# Patient Record
Sex: Female | Born: 1990 | Race: Black or African American | Hispanic: No | Marital: Single | Smoking: Never smoker
Health system: Southern US, Community
[De-identification: ages and names within clinical notes are randomized; demographics above are authoritative.]

## PROBLEM LIST (undated history)

## (undated) DIAGNOSIS — E559 Vitamin D deficiency, unspecified: Secondary | ICD-10-CM

## (undated) DIAGNOSIS — I10 Essential (primary) hypertension: Secondary | ICD-10-CM

## (undated) DIAGNOSIS — I499 Cardiac arrhythmia, unspecified: Secondary | ICD-10-CM

## (undated) DIAGNOSIS — J45909 Unspecified asthma, uncomplicated: Secondary | ICD-10-CM

## (undated) DIAGNOSIS — K219 Gastro-esophageal reflux disease without esophagitis: Secondary | ICD-10-CM

## (undated) DIAGNOSIS — M1990 Unspecified osteoarthritis, unspecified site: Secondary | ICD-10-CM

## (undated) DIAGNOSIS — R519 Headache, unspecified: Secondary | ICD-10-CM

## (undated) DIAGNOSIS — D649 Anemia, unspecified: Secondary | ICD-10-CM

## (undated) DIAGNOSIS — F419 Anxiety disorder, unspecified: Secondary | ICD-10-CM

## (undated) DIAGNOSIS — E119 Type 2 diabetes mellitus without complications: Secondary | ICD-10-CM

## (undated) DIAGNOSIS — E785 Hyperlipidemia, unspecified: Secondary | ICD-10-CM

## (undated) HISTORY — DX: Essential (primary) hypertension: I10

## (undated) HISTORY — PX: WISDOM TOOTH EXTRACTION: SHX21

## (undated) HISTORY — DX: Unspecified osteoarthritis, unspecified site: M19.90

---
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
## 2011-01-06 ENCOUNTER — Emergency Department (HOSPITAL_COMMUNITY)
Admission: EM | Admit: 2011-01-06 | Discharge: 2011-01-06 | Disposition: A | Discharge status: Home / Self Care | Payer: 59 | Attending: Emergency Medicine | Admitting: Emergency Medicine

## 2011-01-06 ENCOUNTER — Emergency Department (HOSPITAL_COMMUNITY): Payer: 59

## 2011-01-06 DIAGNOSIS — R0989 Other specified symptoms and signs involving the circulatory and respiratory systems: Secondary | ICD-10-CM | POA: Insufficient documentation

## 2011-01-06 DIAGNOSIS — E1142 Type 2 diabetes mellitus with diabetic polyneuropathy: Secondary | ICD-10-CM | POA: Insufficient documentation

## 2011-01-06 DIAGNOSIS — M79609 Pain in unspecified limb: Secondary | ICD-10-CM | POA: Insufficient documentation

## 2011-01-06 DIAGNOSIS — Z794 Long term (current) use of insulin: Secondary | ICD-10-CM | POA: Insufficient documentation

## 2011-01-06 DIAGNOSIS — R0789 Other chest pain: Secondary | ICD-10-CM | POA: Insufficient documentation

## 2011-01-06 DIAGNOSIS — R0609 Other forms of dyspnea: Secondary | ICD-10-CM | POA: Insufficient documentation

## 2011-01-06 DIAGNOSIS — R05 Cough: Secondary | ICD-10-CM | POA: Insufficient documentation

## 2011-01-06 DIAGNOSIS — E1049 Type 1 diabetes mellitus with other diabetic neurological complication: Secondary | ICD-10-CM | POA: Insufficient documentation

## 2011-01-06 DIAGNOSIS — R059 Cough, unspecified: Secondary | ICD-10-CM | POA: Insufficient documentation

## 2011-01-06 LAB — CBC
HCT: 36.2 % (ref 36.0–46.0)
Hemoglobin: 11.7 g/dL — ABNORMAL LOW (ref 12.0–15.0)
MCH: 25.9 pg — ABNORMAL LOW (ref 26.0–34.0)
MCHC: 32.3 g/dL (ref 30.0–36.0)
MCV: 80.3 fL (ref 78.0–100.0)
Platelets: 256 10*3/uL (ref 150–400)
RBC: 4.51 MIL/uL (ref 3.87–5.11)
RDW: 12.5 % (ref 11.5–15.5)
WBC: 5.1 10*3/uL (ref 4.0–10.5)

## 2011-01-06 LAB — BASIC METABOLIC PANEL
BUN: 8 mg/dL (ref 6–23)
CO2: 28 mEq/L (ref 19–32)
Calcium: 9.4 mg/dL (ref 8.4–10.5)
Chloride: 101 mEq/L (ref 96–112)
Creatinine, Ser: 0.38 mg/dL — ABNORMAL LOW (ref 0.50–1.10)
GFR calc Af Amer: >90 mL/min (ref >90)
GFR calc non Af Amer: >90 mL/min (ref >90)
Glucose, Bld: 179 mg/dL — ABNORMAL HIGH (ref 70–99)
Potassium: 3.1 mEq/L — ABNORMAL LOW (ref 3.5–5.1)
Sodium: 138 mEq/L (ref 135–145)

## 2011-01-06 LAB — DIFFERENTIAL
Basophils Absolute: 0 10*3/uL (ref 0.0–0.1)
Basophils Relative: 1 % (ref 0–1)
Eosinophils Absolute: 0 10*3/uL (ref 0.0–0.7)
Eosinophils Relative: 1 % (ref 0–5)
Lymphocytes Relative: 56 % — ABNORMAL HIGH (ref 12–46)
Lymphs Abs: 2.8 10*3/uL (ref 0.7–4.0)
Monocytes Absolute: 0.5 10*3/uL (ref 0.1–1.0)
Monocytes Relative: 9 % (ref 3–12)
Neutro Abs: 1.7 10*3/uL (ref 1.7–7.7)
Neutrophils Relative %: 34 % — ABNORMAL LOW (ref 43–77)

## 2011-01-06 LAB — URINALYSIS, ROUTINE W REFLEX MICROSCOPIC
Bilirubin Urine: NEGATIVE
Glucose, UA: NEGATIVE mg/dL
Hgb urine dipstick: NEGATIVE
Ketones, ur: 15 mg/dL — AB
Leukocytes, UA: NEGATIVE
Nitrite: NEGATIVE
Protein, ur: NEGATIVE mg/dL
Specific Gravity, Urine: 1.019 (ref 1.005–1.030)
Urobilinogen, UA: 1 mg/dL (ref 0.0–1.0)
pH: 7 (ref 5.0–8.0)

## 2011-01-06 LAB — D-DIMER, QUANTITATIVE (NOT AT ARMC): D-Dimer, Quant: 0.38 ug/mL-FEU (ref 0.00–0.48)

## 2011-01-06 LAB — POCT PREGNANCY, URINE: Preg Test, Ur: NEGATIVE

## 2011-01-06 LAB — GLUCOSE, CAPILLARY: Glucose-Capillary: 160 mg/dL — ABNORMAL HIGH (ref 70–99)

## 2012-03-03 ENCOUNTER — Encounter (HOSPITAL_COMMUNITY): Payer: Self-pay | Admitting: Emergency Medicine

## 2012-03-03 ENCOUNTER — Emergency Department (INDEPENDENT_AMBULATORY_CARE_PROVIDER_SITE_OTHER)
Admission: EM | Admit: 2012-03-03 | Discharge: 2012-03-03 | Disposition: A | Discharge status: Home / Self Care | Payer: 59 | Source: Home / Self Care | Attending: Emergency Medicine | Admitting: Emergency Medicine

## 2012-03-03 DIAGNOSIS — N390 Urinary tract infection, site not specified: Secondary | ICD-10-CM

## 2012-03-03 HISTORY — DX: Type 2 diabetes mellitus without complications: E11.9

## 2012-03-03 HISTORY — DX: Unspecified asthma, uncomplicated: J45.909

## 2012-03-03 LAB — POCT PREGNANCY, URINE: Preg Test, Ur: NEGATIVE

## 2012-03-03 LAB — POCT URINALYSIS DIP (DEVICE)
Glucose, UA: >=1000 mg/dL — AB
Ketones, ur: 80 mg/dL — AB
Leukocytes, UA: NEGATIVE
Nitrite: POSITIVE — AB
Protein, ur: 100 mg/dL — AB
Specific Gravity, Urine: <=1.005 (ref 1.005–1.030)
Urobilinogen, UA: 2 mg/dL — ABNORMAL HIGH (ref 0.0–1.0)
pH: 5 (ref 5.0–8.0)

## 2012-03-03 LAB — GLUCOSE, CAPILLARY: Glucose-Capillary: 349 mg/dL — ABNORMAL HIGH (ref 70–99)

## 2012-03-03 MED ORDER — INSULIN ASPART 100 UNIT/ML ~~LOC~~ SOLN: 8.0000 [IU] | Freq: Once | SUBCUTANEOUS | Status: DC

## 2012-03-03 MED ORDER — CEPHALEXIN 500 MG PO CAPS: 500.0000 mg | ORAL_CAPSULE | Freq: Four times a day (QID) | ORAL | Status: AC

## 2012-03-03 NOTE — ED Provider Notes (Signed)
History     CSN: [id number]  Arrival date & time 03/03/12  1416   First MD Initiated Contact with Patient 03/03/12 1445      Chief Complaint  Patient presents with  . Dysuria    pain with urninating since saturday. frequency. odor pelvic pain    (Consider location/radiation/quality/duration/timing/severity/associated sxs/prior treatment) HPI Comments: Patient presents urgent care describing that her urine has been cloudy and with a bad odor since Tuesday. Been having some pressure with urination burning and discomfort been taking over-the-counter AZO, which has helped some. She denies any fevers, nausea vomiting or flank pain. Describes lower valve pressure with urination no pain at rest.  Patient is a 21 y.o. female presenting with dysuria. The history is provided by the patient.  Dysuria  This is a new problem. The problem occurs every urination. The problem has been gradually worsening. The quality of the pain is described as burning. The pain is at a severity of 5/10. The pain is moderate. There has been no fever. Associated symptoms include frequency, hesitancy and urgency. Pertinent negatives include no chills, no vomiting, no possible pregnancy and no flank pain. Her past medical history does not include kidney stones.    Past Medical History  Diagnosis Date  . Asthma   . Diabetes mellitus without complication     History reviewed. No pertinent past surgical history.  History reviewed. No pertinent family history.  History  Substance Use Topics  . Smoking status: Never Smoker   . Smokeless tobacco: Not on file  . Alcohol Use: No    OB History    Grav Para Term Preterm Abortions TAB SAB Ect Mult Living                  Review of Systems  Constitutional: Negative for fever, chills and appetite change.  Gastrointestinal: Negative for vomiting.  Genitourinary: Positive for dysuria, hesitancy, urgency and frequency. Negative for flank pain.    Allergies  Review  of patient's allergies indicates no known allergies.  Home Medications   Current Outpatient Rx  Name  Route  Sig  Dispense  Refill  . CEPHALEXIN 500 MG PO CAPS   Oral   Take 1 capsule (500 mg total) by mouth 4 (four) times daily.   28 capsule   0   . DULOXETINE HCL 60 MG PO CPEP   Oral   Take 60 mg by mouth daily.         Marland Kitchen HUMALOG KWIKPEN 100 UNIT/ML [place] SOLN               . HYDROCODONE-ACETAMINOPHEN PO   Oral   Take by mouth.         Marland Kitchen PREGABALIN 150 MG PO CAPS   Oral   Take 150 mg by mouth 2 (two) times daily.           BP 145/92  Pulse 114  Temp 98.9 F (37.2 C) (Oral)  Resp 18  SpO2 96%  LMP 02/14/2012  Physical Exam  Vitals reviewed. Constitutional: Vital signs are normal. She appears well-developed and well-nourished.  Non-toxic appearance. She does not have a sickly appearance. She does not appear ill. No distress.  HENT:  Head: Normocephalic.  Eyes: Conjunctivae normal are normal.  Neck: Neck supple.  Pulmonary/Chest: Effort normal and breath sounds normal. No respiratory distress.  Abdominal: Soft. Normal appearance. She exhibits no mass. There is no hepatosplenomegaly or hepatomegaly. There is tenderness. There is no rigidity, no rebound, no guarding,  no CVA tenderness and negative Murphy's sign.  Skin: No rash noted. No erythema.    ED Course  Procedures (including critical care time)  Labs Reviewed  POCT URINALYSIS DIP (DEVICE) - Abnormal; Notable for the following:    Glucose, UA >=1000 (*)     Bilirubin Urine SMALL (*)     Ketones, ur 80 (*)     Hgb urine dipstick SMALL (*)     Protein, ur 100 (*)     Urobilinogen, UA 2.0 (*)     Nitrite POSITIVE (*)     All other components within normal limits  POCT PREGNANCY, URINE  URINE CULTURE   No results found.   1. Urinary tract infection       MDM  Urinary tract infection. Will start patient on Keflex. Encourage her to return if no improvement is noted in 48 hours. Any fevers  or vomiting. Patient agrees with treatment plan and followup care as necessary.        Jimmie Molly, MD 03/03/12 [phone]

## 2012-03-03 NOTE — ED Notes (Signed)
Pt declined insulin injection. Pt states that she will use her own supply. Mw,cma

## 2012-03-03 NOTE — ED Notes (Signed)
Pt c/o dysuria. Pt states that urine is cloudy and has a bad odor./frequency and pelvic pain. Pt states that she did use azo with only mild relief of symptoms. Symptoms are gradually getting worse.

## 2012-03-03 NOTE — ED Notes (Signed)
Waiting discharge papers 

## 2012-03-05 LAB — URINE CULTURE: Colony Count: 50000

## 2012-03-06 NOTE — ED Notes (Signed)
Urine culture: 50,000 colonies E. Coli.  Pt. adequately treated with Keflex. Vassie Moselle 03/06/2012

## 2012-12-21 DIAGNOSIS — Z9119 Patient's noncompliance with other medical treatment and regimen: Secondary | ICD-10-CM | POA: Insufficient documentation

## 2013-03-21 DIAGNOSIS — G709 Myoneural disorder, unspecified: Secondary | ICD-10-CM

## 2013-03-21 HISTORY — DX: Myoneural disorder, unspecified: G70.9

## 2013-08-06 DIAGNOSIS — E1140 Type 2 diabetes mellitus with diabetic neuropathy, unspecified: Secondary | ICD-10-CM | POA: Insufficient documentation

## 2013-09-26 ENCOUNTER — Encounter: Payer: Self-pay | Admitting: Physical Medicine & Rehabilitation

## 2013-10-07 DIAGNOSIS — E668 Other obesity: Secondary | ICD-10-CM | POA: Insufficient documentation

## 2013-10-07 DIAGNOSIS — F32A Depression, unspecified: Secondary | ICD-10-CM | POA: Insufficient documentation

## 2013-10-07 DIAGNOSIS — F329 Major depressive disorder, single episode, unspecified: Secondary | ICD-10-CM | POA: Insufficient documentation

## 2013-11-22 ENCOUNTER — Telehealth: Payer: Self-pay | Admitting: Physical Medicine & Rehabilitation

## 2013-11-22 ENCOUNTER — Encounter: Payer: Self-pay | Admitting: Physical Medicine & Rehabilitation

## 2013-11-22 ENCOUNTER — Encounter: Payer: 59 | Attending: Physical Medicine & Rehabilitation

## 2013-11-22 ENCOUNTER — Ambulatory Visit (HOSPITAL_BASED_OUTPATIENT_CLINIC_OR_DEPARTMENT_OTHER): Payer: 59 | Admitting: Physical Medicine & Rehabilitation

## 2013-11-22 VITALS — BP 148/80 | HR 102 | Resp 16 | Ht 64.0 in | Wt 261.0 lb

## 2013-11-22 DIAGNOSIS — E1142 Type 2 diabetes mellitus with diabetic polyneuropathy: Secondary | ICD-10-CM | POA: Diagnosis not present

## 2013-11-22 DIAGNOSIS — Z79899 Other long term (current) drug therapy: Secondary | ICD-10-CM | POA: Insufficient documentation

## 2013-11-22 DIAGNOSIS — G8929 Other chronic pain: Secondary | ICD-10-CM | POA: Diagnosis not present

## 2013-11-22 DIAGNOSIS — Z5181 Encounter for therapeutic drug level monitoring: Secondary | ICD-10-CM | POA: Diagnosis not present

## 2013-11-22 DIAGNOSIS — M545 Low back pain, unspecified: Secondary | ICD-10-CM | POA: Insufficient documentation

## 2013-11-22 DIAGNOSIS — E1049 Type 1 diabetes mellitus with other diabetic neurological complication: Secondary | ICD-10-CM

## 2013-11-22 DIAGNOSIS — E1042 Type 1 diabetes mellitus with diabetic polyneuropathy: Secondary | ICD-10-CM

## 2013-11-22 NOTE — Telephone Encounter (Signed)
Pt called to say she was very upset that Dr. Wynn Banker showed no compassion or empathy to her in her opinion. She felt that he made a judgement call "by looking at her exterior". She says she has never been treated this way and is going to call the Department of Insurance to see who she can file a complaint with. She states she will be telling her PCP that she does not wish to come here and would not recommend anyone come here. She spent quite a bit of time complaining about Dr. Wynn Banker telling her to lose weight. She stated more than once, very loudly, "How dare he tell me I'm not in pain from neuropathy". She ended the call stating that I could call her back if I really wanted hear this all over again but I was not the only person she was going to complain to, I was just her first stop.

## 2013-11-22 NOTE — Patient Instructions (Signed)
Will need records from [facility] pain management in regards to what type of injection they did and at what level.  Will also need MRI report from [facility].  agree with physical therapy  Recommend weight loss of at least 10% which would equal about 25 pounds

## 2013-11-22 NOTE — Telephone Encounter (Signed)
I explained that her back pain was not from her neuropathy however it is related to her weight. I never told her she had no pain from the neuropathy. 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

## 2013-11-22 NOTE — Progress Notes (Signed)
Subjective:    Patient ID: [name], female    DOB: 1991-02-29, 23 y.o.   MRN: [id number]  HPI CC:  Low back pain  23 year old female with history of diabetes onset age 75. She has a history of diabetic neuropathy however symptoms are well controlled on a combination of Lyrica and Cymbalta.  In terms of her low back, onset 2012. No history of accidents or injury. Has tried back bracing which was not helpful. Underwent physical therapy a couple years ago. It was helpful while she is going through it.  A couple months after she quit her pain came back again. Patient tried aquatic therapy at the [facility] one-on-one visit. This was not helpful Reason referral for another round of physical therapy.  Has been seen in the past at [facility] [address] [place] [address] [phone]  Patient no longer going there secondary to financial reasons.  Patient had some type of injections which she's felt were helpful. The injections were bilateral. No records of this at the current time.  Patient has also had MRI in the past at [facility], results are not available to me.  HgbA1C is 10 Pain Inventory Average Pain 7 Pain Right Now 7 My pain is constant, sharp, burning, stabbing, tingling and aching  In the last 24 hours, has pain interfered with the following? General activity 5 Relation with others 5 Enjoyment of life 5 What TIME of day is your pain at its worst? daytime Sleep (in general) Fair  Pain is worse with: bending, standing and some activites Pain improves with: heat/ice, therapy/exercise, medication and injections Relief from Meds: 5  Mobility walk without assistance ability to climb steps?  yes do you drive?  no transfers alone Do you have any goals in this area?  yes  Function disabled: date disabled 01/2013  Neuro/Psych bowel control problems weakness numbness tremor tingling spasms depression anxiety  Prior Studies Any  changes since last visit?  yes x-rays CT/MRI nerve study  Physicians involved in your care Any changes since last visit?  no   Family History  Problem Relation Age of Onset  . Diabetes Maternal Grandmother    History   Social History  . Marital Status: Single    Spouse Name: N/A    Number of Children: N/A  . Years of Education: N/A   Social History Main Topics  . Smoking status: Never Smoker   . Smokeless tobacco: None  . Alcohol Use: No  . Drug Use: None  . Sexual Activity: Yes    Birth Control/ Protection: None   Other Topics Concern  . None   Social History Narrative  . None   History reviewed. No pertinent past surgical history. Past Medical History  Diagnosis Date  . Asthma   . Diabetes mellitus without complication   . Arthritis   . Hypertension    BP 148/80  Pulse 102  Resp 16  Ht 5\' 4"  (1.626 m)  Wt 261 lb (118.389 kg)  BMI 44.78 kg/m2  SpO2 100%  Opioid Risk Score: 2 Fall Risk Score: Moderate Fall Risk (6-13 points) (pt educated, handout given to pt)    Review of Systems  Constitutional: Positive for unexpected weight change.  Respiratory: Positive for shortness of breath.   Gastrointestinal: Positive for abdominal pain and constipation.  Genitourinary:       Bowel control problems  Skin: Positive for rash.  Neurological: Positive for tremors, weakness and numbness.  Tingling, spasms  Psychiatric/Behavioral: The patient is nervous/anxious.        Depression  All other systems reviewed and are negative.      Objective:   Physical Exam  Nursing note and vitals reviewed. Constitutional: She is oriented to person, place, and time. She appears well-developed and well-nourished.  HENT:  Head: Normocephalic and atraumatic.  Eyes: Conjunctivae and EOM are normal. Pupils are equal, round, and reactive to light.  Neck: Normal range of motion.  Musculoskeletal:       Cervical back: Normal.       Thoracic back: She exhibits  tenderness.       Lumbar back: She exhibits tenderness. She exhibits no deformity.  Negative straight leg raising test    Neurological: She is alert and oriented to person, place, and time. She has normal strength. She displays no atrophy and no tremor. She exhibits normal muscle tone. Gait normal.  Reflex Scores:      Tricep reflexes are 2+ on the right side and 2+ on the left side.      Bicep reflexes are 2+ on the right side and 2+ on the left side.      Brachioradialis reflexes are 2+ on the right side and 2+ on the left side.      Patellar reflexes are 2+ on the right side and 2+ on the left side.      Achilles reflexes are 0 on the right side and 0 on the left side. Mildly reduced pinprick sensation in the toes  No evidence of foot intrinsic muscle atrophy  Psychiatric: She has a normal mood and affect.   Patient has pain with lumbar extension greater than lumbar flexion. FABERs negative        Assessment & Plan:   1.  Chronic low back pain- no signs of radiculopathy. Suspect lumbar facet mediated pain given greater pain with extension. Also had an injection [facility] at [facility] on both sides of her lumbar area with temporary relief. She states that physician discussed another longer-lasting procedure which sounds like radio frequency neurotomy. Requesting records from [facility]   Patient reports having had MRI will require the report to be sent to her office to further review prior to performing any type of intervention pain procedure.  We discussed given her body habitus that facet mediated pain is more common and that weight loss of as little is 10% would be beneficial not only for back pain but diabetic control.    We'll need to review your drugs 3 as well as lumbar MRI results prior to prescribing any narcotic analgesic medications.  2. Diabetic neuropathy with mildly decreased sensation in toes, no signs of skin breakdown, per patient  these symptoms are well managed with combination of Cymbalta 60 mg a day and Lyrica 150 mg twice a day. I discussed with patient that her low back pain is not related to her diabetic neuropathy.

## 2013-12-31 ENCOUNTER — Other Ambulatory Visit: Payer: Self-pay | Admitting: Gastroenterology

## 2013-12-31 DIAGNOSIS — R1111 Vomiting without nausea: Secondary | ICD-10-CM

## 2013-12-31 DIAGNOSIS — R1011 Right upper quadrant pain: Secondary | ICD-10-CM

## 2014-01-10 ENCOUNTER — Emergency Department (HOSPITAL_COMMUNITY)
Admission: EM | Admit: 2014-01-10 | Discharge: 2014-01-10 | Disposition: A | Discharge status: Home / Self Care | Payer: 59 | Attending: Emergency Medicine | Admitting: Emergency Medicine

## 2014-01-10 ENCOUNTER — Encounter (HOSPITAL_COMMUNITY): Payer: Self-pay | Admitting: Emergency Medicine

## 2014-01-10 DIAGNOSIS — Z79899 Other long term (current) drug therapy: Secondary | ICD-10-CM | POA: Diagnosis not present

## 2014-01-10 DIAGNOSIS — E119 Type 2 diabetes mellitus without complications: Secondary | ICD-10-CM | POA: Insufficient documentation

## 2014-01-10 DIAGNOSIS — L235 Allergic contact dermatitis due to other chemical products: Secondary | ICD-10-CM | POA: Insufficient documentation

## 2014-01-10 DIAGNOSIS — Z8739 Personal history of other diseases of the musculoskeletal system and connective tissue: Secondary | ICD-10-CM | POA: Insufficient documentation

## 2014-01-10 DIAGNOSIS — J45909 Unspecified asthma, uncomplicated: Secondary | ICD-10-CM | POA: Insufficient documentation

## 2014-01-10 DIAGNOSIS — I10 Essential (primary) hypertension: Secondary | ICD-10-CM | POA: Insufficient documentation

## 2014-01-10 DIAGNOSIS — L259 Unspecified contact dermatitis, unspecified cause: Secondary | ICD-10-CM

## 2014-01-10 DIAGNOSIS — L299 Pruritus, unspecified: Secondary | ICD-10-CM | POA: Diagnosis present

## 2014-01-10 DIAGNOSIS — Z794 Long term (current) use of insulin: Secondary | ICD-10-CM | POA: Insufficient documentation

## 2014-01-10 MED ORDER — FAMOTIDINE 20 MG PO TABS: 20.0000 mg | ORAL_TABLET | Freq: Two times a day (BID) | ORAL | Status: DC

## 2014-01-10 MED ORDER — FAMOTIDINE 20 MG PO TABS
20.0000 mg | ORAL_TABLET | Freq: Once | ORAL | Status: AC
  Administered 2014-01-10: 20 mg via ORAL
  Filled 2014-01-10: qty 1

## 2014-01-10 MED ORDER — DIPHENHYDRAMINE HCL 50 MG/ML IJ SOLN
50.0000 mg | Freq: Once | INTRAMUSCULAR | Status: AC
  Administered 2014-01-10: 50 mg via INTRAMUSCULAR
  Filled 2014-01-10: qty 1

## 2014-01-10 MED ORDER — CEPHALEXIN 500 MG PO CAPS: 500.0000 mg | ORAL_CAPSULE | Freq: Three times a day (TID) | ORAL | Status: DC

## 2014-01-10 MED ORDER — CEPHALEXIN 500 MG PO CAPS
500.0000 mg | ORAL_CAPSULE | Freq: Once | ORAL | Status: AC
  Administered 2014-01-10: 500 mg via ORAL
  Filled 2014-01-10: qty 1

## 2014-01-10 MED ORDER — PREDNISONE 20 MG PO TABS: ORAL_TABLET | ORAL | Status: DC

## 2014-01-10 MED ORDER — CETIRIZINE HCL 10 MG PO TABS: 10.0000 mg | ORAL_TABLET | Freq: Every day | ORAL | Status: DC

## 2014-01-10 MED ORDER — METHYLPREDNISOLONE SODIUM SUCC 125 MG IJ SOLR
125.0000 mg | Freq: Once | INTRAMUSCULAR | Status: AC
  Administered 2014-01-10: 125 mg via INTRAMUSCULAR
  Filled 2014-01-10: qty 2

## 2014-01-10 NOTE — Discharge Instructions (Signed)
DO NOT USE THAT HAIR PRODUCT AGAIN AND BE CAREFUL WITH OTHER PRODUCTS LIKE IT!!!  Take the medications as prescribed. The zyrtec will be less sedating than the benadryl which you can take 25-50 mg every 6 hrs for itching and swelling.  Recheck if you get a fever, your scalp starts getting more painful, your scalp starts draining pus or you have trouble breathing or swallowing. Monitor your blood glucose closely while on the prednisone and watch what you eat until the prednisone is gone.

## 2014-01-10 NOTE — ED Notes (Signed)
Pt presents with c/o of gradual shortness of breath, mild chest pain and facial rush. Pt states she used "protein pack" for her hair after which she had onset of the symptoms. Pt mildly anxious but in NAD.

## 2014-01-10 NOTE — ED Provider Notes (Signed)
CSN: [id number]     Arrival date & time 01/10/14  2026 History   First MD Initiated Contact with Patient 01/10/14 2106     Chief Complaint  Patient presents with  . Allergic Reaction     (Consider location/radiation/quality/duration/timing/severity/associated sxs/prior Treatment) HPI Patient states yesterday she used a protein pack which is a oil in her hair. She states she left it on for 10 minutes then rinsed it off. She states she had almost immediate itching of her scalp afterwards. She states this morning when she woke up her ears were swollen and they felt hot. She also states she was having continued itching of her scalp however now her scalp is sore and painful. She had some crusting in her ears today. She states her face is burning and her friend states her face looks swollen. She has not had any draining from her scalp. She states she's never used this treatment before. She's never had a reaction like this before. She is not having difficulty breathing or swallowing.   PCP Dr Leavy Cella  Past Medical History  Diagnosis Date  . Asthma   . Diabetes mellitus without complication   . Arthritis   . Hypertension    History reviewed. No pertinent past surgical history. Family History  Problem Relation Age of Onset  . Diabetes Maternal Grandmother    History  Substance Use Topics  . Smoking status: Never Smoker   . Smokeless tobacco: Not on file  . 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     Allergies  Review of patient's allergies indicates no known allergies.  Home Medications   Prior to Admission medications   Medication Sig Start Date End Date Taking? Authorizing Provider  dexlansoprazole (DEXILANT) 60 MG capsule Take 60 mg by mouth daily.   Yes Historical Provider, MD  HUMALOG KWIKPEN 100 UNIT/ML  injection Inject 22 Units into the skin 3 (three) times daily. Sliding Scale 08/27/10  Yes Historical Provider, MD  insulin glargine (LANTUS) 100 UNIT/ML injection Inject 60 Units into the skin at bedtime.   Yes Historical Provider, MD  pregabalin (LYRICA) 150 MG capsule Take 150 mg by mouth 2 (two) times daily.   Yes Historical Provider, MD   BP 150/101  Pulse 110  Temp(Src) 99.1 F (37.3 C) (Oral)  Resp 20  SpO2 100%  LMP 12/30/2013  Vital signs normal except hypertension and tachycardia   Physical Exam  Nursing note and vitals reviewed. Constitutional: She is oriented to person, place, and time. She appears well-developed and well-nourished.  Non-toxic appearance. She does not appear ill. No distress.  HENT:  Head: Normocephalic and atraumatic.  Right Ear: External ear normal.  Left Ear: External ear normal.  Nose: Nose normal. No mucosal edema or rhinorrhea.  Mouth/Throat: Oropharynx is clear and moist and mucous membranes are normal. No dental abscesses or uvula swelling.  Eyes: Conjunctivae and EOM are normal. Pupils are equal, round, and reactive to light.  Neck: Normal range of motion and full passive range of motion without pain. Neck supple.  Cardiovascular: Normal rate, regular rhythm and normal heart sounds.  Exam reveals no gallop and no friction rub.   No murmur heard. Pulmonary/Chest: Effort normal and breath sounds normal. No respiratory distress.  She has no wheezes. She has no rhonchi. She has no rales. She exhibits no crepitus.  Abdominal: Normal appearance.  Musculoskeletal: Normal range of motion. She exhibits no edema and no tenderness.  Moves all extremities well.   Neurological: She is alert and oriented to person, place, and time. She has normal strength. No cranial nerve deficit.  Skin: Skin is warm, dry and intact. No rash noted. No erythema. No pallor.  Patient's noted to have some diffuse redness of her scalp without open lesions. She has some mild swelling  seen at the hairline. She has diffuse redness and swelling of her ear lobes. She does not have lesions in her mouth. She does not have any rash on her arms or legs or trunk.  Psychiatric: She has a normal mood and affect. Her speech is normal and behavior is normal. Her mood appears not anxious.    ED Course  Procedures (including critical care time)  Medications  diphenhydrAMINE (BENADRYL) injection 50 mg (50 mg Intramuscular Given 01/10/14 2146)  famotidine (PEPCID) tablet 20 mg (20 mg Oral Given 01/10/14 2146)  methylPREDNISolone sodium succinate (SOLU-MEDROL) 125 mg/2 mL injection 125 mg (125 mg Intramuscular Given 01/10/14 2146)  cephALEXin (KEFLEX) capsule 500 mg (500 mg Oral Given 01/10/14 2243)    Labs Review CBG 120  Imaging Review No results found.   EKG Interpretation None      MDM   Final diagnoses:  Contact dermatitis    New Prescriptions   CEPHALEXIN (KEFLEX) 500 MG CAPSULE    Take 1 capsule (500 mg total) by mouth 3 (three) times daily.   CETIRIZINE (ZYRTEC) 10 MG TABLET    Take 1 tablet (10 mg total) by mouth daily.   FAMOTIDINE (PEPCID) 20 MG TABLET    Take 1 tablet (20 mg total) by mouth 2 (two) times daily.   PREDNISONE (DELTASONE) 20 MG TABLET    Take 3 po QD x 2d starting October 24, then 2 po QD x 3d then 1 po QD x 3d    Plan discharge  Devoria Albe, MD, Franz Dell, MD 01/10/14 [phone]

## 2014-01-10 NOTE — ED Notes (Signed)
CBG 120 

## 2014-01-13 ENCOUNTER — Ambulatory Visit (HOSPITAL_COMMUNITY)
Admission: RE | Admit: 2014-01-13 | Discharge: 2014-01-13 | Disposition: A | Discharge status: Home / Self Care | Payer: 59 | Source: Ambulatory Visit | Attending: Gastroenterology | Admitting: Gastroenterology

## 2014-01-13 ENCOUNTER — Encounter (HOSPITAL_COMMUNITY)
Admission: RE | Admit: 2014-01-13 | Discharge: 2014-01-13 | Disposition: A | Discharge status: Home / Self Care | Payer: 59 | Source: Ambulatory Visit | Attending: Diagnostic Radiology | Admitting: Diagnostic Radiology

## 2014-01-13 DIAGNOSIS — R1011 Right upper quadrant pain: Secondary | ICD-10-CM | POA: Insufficient documentation

## 2014-01-13 DIAGNOSIS — R1111 Vomiting without nausea: Secondary | ICD-10-CM | POA: Diagnosis not present

## 2014-01-13 LAB — CBG MONITORING, ED: Glucose-Capillary: 120 mg/dL — ABNORMAL HIGH (ref 70–99)

## 2014-01-13 MED ORDER — TECHNETIUM TC 99M MEBROFENIN IV KIT
4.9000 | PACK | Freq: Once | INTRAVENOUS | Status: AC | PRN
  Administered 2014-01-13: 4.9 via INTRAVENOUS

## 2014-01-13 MED ORDER — SINCALIDE 5 MCG IJ SOLR
0.0200 ug/kg | Freq: Once | INTRAMUSCULAR | Status: AC
  Administered 2014-01-13: 2 ug via INTRAVENOUS

## 2014-01-20 ENCOUNTER — Other Ambulatory Visit: Payer: Self-pay | Admitting: Gastroenterology

## 2014-01-20 DIAGNOSIS — R1013 Epigastric pain: Secondary | ICD-10-CM

## 2014-01-20 DIAGNOSIS — R110 Nausea: Secondary | ICD-10-CM

## 2014-01-29 ENCOUNTER — Encounter (HOSPITAL_COMMUNITY)
Admission: RE | Admit: 2014-01-29 | Discharge: 2014-01-29 | Disposition: A | Discharge status: Home / Self Care | Payer: 59 | Source: Ambulatory Visit | Attending: Gastroenterology | Admitting: Gastroenterology

## 2014-01-29 DIAGNOSIS — R110 Nausea: Secondary | ICD-10-CM | POA: Insufficient documentation

## 2014-01-29 DIAGNOSIS — R1013 Epigastric pain: Secondary | ICD-10-CM

## 2014-01-29 MED ORDER — TECHNETIUM TC 99M SULFUR COLLOID
2.0000 | Freq: Once | INTRAVENOUS | Status: AC | PRN
  Administered 2014-01-29: 2 via ORAL

## 2014-07-22 ENCOUNTER — Encounter: Payer: 59 | Attending: Internal Medicine | Admitting: *Deleted

## 2014-07-22 DIAGNOSIS — Z794 Long term (current) use of insulin: Secondary | ICD-10-CM | POA: Insufficient documentation

## 2014-07-22 DIAGNOSIS — E108 Type 1 diabetes mellitus with unspecified complications: Secondary | ICD-10-CM | POA: Insufficient documentation

## 2014-07-22 DIAGNOSIS — IMO0002 Reserved for concepts with insufficient information to code with codable children: Secondary | ICD-10-CM

## 2014-07-22 DIAGNOSIS — Z713 Dietary counseling and surveillance: Secondary | ICD-10-CM | POA: Insufficient documentation

## 2014-07-22 DIAGNOSIS — E1065 Type 1 diabetes mellitus with hyperglycemia: Secondary | ICD-10-CM

## 2014-07-25 ENCOUNTER — Encounter: Payer: Self-pay | Admitting: *Deleted

## 2014-07-25 NOTE — Progress Notes (Signed)
Introduction to Insulin Pump Therapy:  Date: 07/22/14  Appt start time: 1530 end time:  1700.  Assessment:  This patient has DM 1 and their primary concerns today: intro to pump therapy.  This patient is interested in learning more about insulin pump therapy because she wants to not take shots all day and to improve her control  MEDICATIONS: Basal Insulin: 50 units of Lantus at AM via pen  Bolus Insulin: 75 units of Humalog at Sliding Scale based on BG, typically 23 units after each meal  via pen  Total of insulin doses per day 125 Other diabetes medications: none  Patient states they are currently testing less than 2 times per day Patient does not currently have Ketone Strips  Current meal time insulin:  16 units per meal  If BG OK or Sliding Scale if BG is elevated (see below) Current Correction insulin:     Sliding Scale: 150-200 = 16 units    201-250 = 23 units    251-300 = 26 units Patient states knowledge of Carb Counting is poor  Usual physical activity: none due to neuropathy pain  Last A1c was 11.3  Patient states complications from diabetes include neuropathy Patient states they forget to take their Lantus insulin injection on average 3 times per week Patient states they have had hypoglycemia 0 times in the past month Patient states their biggest barrier with diabetes is remembering to take Lantus and frustration of not really being in control of her life  Patient currently is not working but she has enrolled in college and it taking online classes towards a psychology degree  Progress Towards Obtaining an Insulin PumpGoal(s):  In progress.  Patient states their expectations of pump therapy include: no more shots, better BG control Patient expresses understanding that for improved outcomes for their diabetes on an insulin pump they will:  Check BG at least 4 times per day  Change out pump infusion set at least every 3 days  Upload pump information to their pump's Web  Based Program on a regular basis so provider can assess patterns and make setting adjustments.     Intervention:    Taught difference between delivery of insulin via syringe/pen compared to insulin pump.  Demonstrated improved insulin delivery via pump due to improved accuracy of dose and flexibility of adjusting bolus insulin based on carb intake and BG correction.  Showed patient the following pumps: Animas, Medtronic, OmniPod, Tandem. She is interested in pursuing the Tandem  Demonstrated pump, insulin reservoir and infusion set options, and button pushing for bolus delivery of insulin through the pump  Explained importance of testing BG at least 4 times per day for appropriate correction of high BG and prevention of DKA as applicable.  Emphasized importance of follow up after Pump Start for appropriate pump setting adjustments and on-going training on more advanced features.  Discussed current Continuous Glucose Monitoring options.  Handouts given during visit include:  Introduction to Pump Therapy Handout  Insulin Pump Packet (she already has all pump packets)  Monitoring/Evaluation:    Patient does want to continue with pursuit of Tandem insulin pump.  Patient requested that I contact that Pump Company Rep so they can be contacted to start the process of obtaining the pump.  Patient instructed to go to that pump web-site to complete any learning module on insulin pump prior to next visit  Once pump is shipped, patient to call this office to set up training.

## 2015-01-27 ENCOUNTER — Encounter: Payer: 59 | Attending: Internal Medicine | Admitting: *Deleted

## 2015-01-27 DIAGNOSIS — E108 Type 1 diabetes mellitus with unspecified complications: Principal | ICD-10-CM

## 2015-01-27 DIAGNOSIS — E1065 Type 1 diabetes mellitus with hyperglycemia: Secondary | ICD-10-CM

## 2015-01-27 DIAGNOSIS — IMO0002 Reserved for concepts with insufficient information to code with codable children: Secondary | ICD-10-CM

## 2015-01-29 NOTE — Progress Notes (Signed)
Insulin Pump Start Progress Note on 01/27/15  Patient appointment start time: 1400  End time 1600  Patient here for insulin pump start on Medtronic 630G pump and Quickset infusion set Orders with pump settings received from MD Patient completed Pre- training by return demonstration  Reviewed Pump Set Up including  Menu Settings  Bolus Settings:    Insulin Carb Ratio of 1 unit / 5 grams     Insulin Sensitivity Factor of 1 unit / 15 mg/dl              Target: 334 mg/dl  Suspend  Basal with initial Basal Rate of 2.00 units/hour  Reservoir Set Up  Utilities  Pump Training Checklist completed  Did not use Temp Basal  due to patient taking their long acting insulin yesterday morning  Patient is aware to sign up for Web Based Software and agrees to upload within 3 days for review of progress and allow for pump setting adjustments.  Patient successfully completed pump start and instructed to call me if BG drops below 70 mg/dl or goes above 356 mg/dl or as directed by MD  Follow up plan: Patient offered follow up in 3 days for her 1st change out but she declined. Recommend follow up within 2 weeks to review [facility] reports as needed

## 2015-06-11 ENCOUNTER — Ambulatory Visit
Admission: RE | Admit: 2015-06-11 | Discharge: 2015-06-11 | Disposition: A | Discharge status: Home / Self Care | Payer: 59 | Source: Ambulatory Visit | Attending: Pain Medicine | Admitting: Pain Medicine

## 2015-06-11 ENCOUNTER — Other Ambulatory Visit: Payer: Self-pay | Admitting: Pain Medicine

## 2015-06-11 DIAGNOSIS — M545 Low back pain, unspecified: Secondary | ICD-10-CM

## 2015-06-11 DIAGNOSIS — M4307 Spondylolysis, lumbosacral region: Secondary | ICD-10-CM

## 2015-06-11 DIAGNOSIS — G8929 Other chronic pain: Secondary | ICD-10-CM

## 2015-08-21 ENCOUNTER — Other Ambulatory Visit: Payer: Self-pay | Admitting: Pain Medicine

## 2015-08-21 DIAGNOSIS — M25562 Pain in left knee: Principal | ICD-10-CM

## 2015-08-21 DIAGNOSIS — M25561 Pain in right knee: Secondary | ICD-10-CM

## 2015-08-30 ENCOUNTER — Ambulatory Visit
Admission: RE | Admit: 2015-08-30 | Discharge: 2015-08-30 | Disposition: A | Discharge status: Home / Self Care | Payer: 59 | Source: Ambulatory Visit | Attending: Pain Medicine | Admitting: Pain Medicine

## 2015-08-30 DIAGNOSIS — M25561 Pain in right knee: Secondary | ICD-10-CM

## 2015-08-30 DIAGNOSIS — M25562 Pain in left knee: Principal | ICD-10-CM

## 2016-08-01 ENCOUNTER — Other Ambulatory Visit: Payer: Self-pay | Admitting: Surgical Oncology

## 2016-08-01 DIAGNOSIS — E1065 Type 1 diabetes mellitus with hyperglycemia: Secondary | ICD-10-CM

## 2016-08-01 DIAGNOSIS — I10 Essential (primary) hypertension: Secondary | ICD-10-CM

## 2016-08-01 DIAGNOSIS — E1159 Type 2 diabetes mellitus with other circulatory complications: Secondary | ICD-10-CM

## 2016-08-01 DIAGNOSIS — IMO0002 Reserved for concepts with insufficient information to code with codable children: Secondary | ICD-10-CM

## 2016-08-01 DIAGNOSIS — I152 Hypertension secondary to endocrine disorders: Secondary | ICD-10-CM

## 2016-08-01 DIAGNOSIS — E1042 Type 1 diabetes mellitus with diabetic polyneuropathy: Secondary | ICD-10-CM

## 2016-08-05 ENCOUNTER — Ambulatory Visit
Admission: RE | Admit: 2016-08-05 | Discharge: 2016-08-05 | Disposition: A | Discharge status: Home / Self Care | Payer: 59 | Source: Ambulatory Visit | Attending: Surgical Oncology | Admitting: Surgical Oncology

## 2016-08-05 DIAGNOSIS — I152 Hypertension secondary to endocrine disorders: Secondary | ICD-10-CM

## 2016-08-05 DIAGNOSIS — E1065 Type 1 diabetes mellitus with hyperglycemia: Secondary | ICD-10-CM

## 2016-08-05 DIAGNOSIS — IMO0002 Reserved for concepts with insufficient information to code with codable children: Secondary | ICD-10-CM

## 2016-08-05 DIAGNOSIS — E1042 Type 1 diabetes mellitus with diabetic polyneuropathy: Secondary | ICD-10-CM

## 2016-08-05 DIAGNOSIS — I10 Essential (primary) hypertension: Secondary | ICD-10-CM

## 2016-08-05 DIAGNOSIS — E1159 Type 2 diabetes mellitus with other circulatory complications: Secondary | ICD-10-CM

## 2017-07-19 HISTORY — PX: GASTRIC BYPASS: SHX52

## 2018-02-28 ENCOUNTER — Emergency Department (HOSPITAL_COMMUNITY): Payer: 59

## 2018-02-28 ENCOUNTER — Encounter (HOSPITAL_COMMUNITY): Payer: Self-pay

## 2018-02-28 ENCOUNTER — Other Ambulatory Visit: Payer: Self-pay

## 2018-02-28 ENCOUNTER — Emergency Department (HOSPITAL_COMMUNITY)
Admission: EM | Admit: 2018-02-28 | Discharge: 2018-02-28 | Disposition: A | Discharge status: Home / Self Care | Payer: 59 | Attending: Emergency Medicine | Admitting: Emergency Medicine

## 2018-02-28 DIAGNOSIS — S93601A Unspecified sprain of right foot, initial encounter: Secondary | ICD-10-CM | POA: Insufficient documentation

## 2018-02-28 DIAGNOSIS — E1140 Type 2 diabetes mellitus with diabetic neuropathy, unspecified: Secondary | ICD-10-CM | POA: Insufficient documentation

## 2018-02-28 DIAGNOSIS — Y9301 Activity, walking, marching and hiking: Secondary | ICD-10-CM | POA: Diagnosis not present

## 2018-02-28 DIAGNOSIS — Z9884 Bariatric surgery status: Secondary | ICD-10-CM | POA: Insufficient documentation

## 2018-02-28 DIAGNOSIS — Z794 Long term (current) use of insulin: Secondary | ICD-10-CM | POA: Diagnosis not present

## 2018-02-28 DIAGNOSIS — Z8674 Personal history of sudden cardiac arrest: Secondary | ICD-10-CM | POA: Insufficient documentation

## 2018-02-28 DIAGNOSIS — W108XXA Fall (on) (from) other stairs and steps, initial encounter: Secondary | ICD-10-CM | POA: Insufficient documentation

## 2018-02-28 DIAGNOSIS — Y999 Unspecified external cause status: Secondary | ICD-10-CM | POA: Diagnosis not present

## 2018-02-28 DIAGNOSIS — Y929 Unspecified place or not applicable: Secondary | ICD-10-CM | POA: Diagnosis not present

## 2018-02-28 DIAGNOSIS — Z79899 Other long term (current) drug therapy: Secondary | ICD-10-CM | POA: Insufficient documentation

## 2018-02-28 DIAGNOSIS — J45909 Unspecified asthma, uncomplicated: Secondary | ICD-10-CM | POA: Diagnosis not present

## 2018-02-28 DIAGNOSIS — S99911A Unspecified injury of right ankle, initial encounter: Secondary | ICD-10-CM | POA: Diagnosis present

## 2018-02-28 HISTORY — DX: Vitamin D deficiency, unspecified: E55.9

## 2018-02-28 HISTORY — DX: Anemia, unspecified: D64.9

## 2018-02-28 NOTE — ED Provider Notes (Signed)
[facility]-EMERGENCY DEPT Provider Note   CSN: [id number] Arrival date & time: 02/28/18  0913     History   Chief Complaint Chief Complaint  Patient presents with  . Ankle Pain    HPI [name] is a 27 y.o. female.  Patient is a 27 year old female with history of diabetes, obesity, and gastric bypass surgery.  She presents today with complaints of right ankle pain.  She states she was walking down the stairs yesterday when she turned her ankle and fell.  She has pain to the top of the foot extending into the ankle.  She has been unable to bear weight since.  He denies any other injury or trauma.  The history is provided by the patient.  Ankle Pain   The incident occurred yesterday. The incident occurred at home. Injury mechanism: Twisting. The pain is present in the right ankle. The pain is moderate. The pain has been constant since onset. Pertinent negatives include no numbness, no loss of sensation and no tingling. The symptoms are aggravated by activity, bearing weight and palpation. She has tried nothing for the symptoms.    Past Medical History:  Diagnosis Date  . Anemia   . Arthritis   . Asthma   . Diabetes mellitus without complication (HCC)   . Hypertension   . Vitamin D deficiency     Patient Active Problem List   Diagnosis Date Noted  . Chronic low back pain 11/22/2013  . Clinical depression 10/07/2013  . Extreme obesity 10/07/2013  . Diabetic neuropathy (HCC) 08/06/2013  . General patient noncompliance 12/21/2012  . 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 Appetite disorder 12/30/2010  . BP (high blood pressure) 12/30/2010  . Muscle ache 12/30/2010  . Cachectic (HCC) 12/30/2010  . 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 Diabetes mellitus 09/03/2010    Past Surgical History:  Procedure Laterality Date  . GASTRIC BYPASS  07/2017     OB History   None      Home Medications    Prior to Admission medications     Medication Sig Start Date End Date Taking? Authorizing Provider  cephALEXin (KEFLEX) 500 MG capsule Take 1 capsule (500 mg total) by mouth 3 (three) times daily. 01/10/14   Devoria Albe, MD  cetirizine (ZYRTEC) 10 MG tablet Take 1 tablet (10 mg total) by mouth daily. 01/10/14   Devoria Albe, MD  dexlansoprazole (DEXILANT) 60 MG capsule Take 60 mg by mouth daily.    [provider]  famotidine (PEPCID) 20 MG tablet Take 1 tablet (20 mg total) by mouth 2 (two) times daily. 01/10/14   Devoria Albe, MD  HUMALOG KWIKPEN 100 UNIT/ML injection Inject 22 Units into the skin 3 (three) times daily. Sliding Scale 08/27/10   [provider]  insulin glargine (LANTUS) 100 UNIT/ML injection Inject 60 Units into the skin at bedtime.    [provider]  predniSONE (DELTASONE) 20 MG tablet Take 3 po QD x 2d starting October 24, then 2 po QD x 3d then 1 po QD x 3d 01/10/14   Devoria Albe, MD  pregabalin (LYRICA) 150 MG capsule Take 150 mg by mouth 2 (two) times daily.    [provider]    Family History Family History  Problem Relation Age of Onset  . Diabetes Maternal Grandmother     Social History Social History   Tobacco Use  . Smoking status: Never Smoker  . Smokeless tobacco: Never Used  Substance Use Topics  .  Alcohol use: No  . Drug use: Never     Allergies   Patient has no known allergies.   Review of Systems Review of Systems  Neurological: Negative for tingling and numbness.  All other systems reviewed and are negative.    Physical Exam Updated Vital Signs BP 122/84 (BP Location: Right Arm)   Pulse 100   Temp 98 F (36.7 C) (Oral)   Resp 16   Ht 5\' 4"  (1.626 m)   Wt 130.6 kg   LMP 02/21/2018   SpO2 100%   BMI 49.44 kg/m   Physical Exam  Constitutional: She is oriented to person, place, and time. She appears well-developed and well-nourished. No distress.  HENT:  Head: Normocephalic and atraumatic.  Neck: Normal range of motion. Neck  supple.  Pulmonary/Chest: Effort normal.  Musculoskeletal:  There is tenderness to palpation over the lateral dorsum of the foot.  There is no significant swelling or deformity.  There is no tenderness over the medial or lateral malleolus.  She has good range of motion, but with some discomfort.  Distal PMS is intact.  Neurological: She is alert and oriented to person, place, and time.  Skin: She is not diaphoretic.  Nursing note and vitals reviewed.    ED Treatments / Results  Labs (all labs ordered are listed, but only abnormal results are displayed) Labs Reviewed - No data to display  EKG None  Radiology No results found.  Procedures Procedures (including critical care time)  Medications Ordered in ED Medications - No data to display   Initial Impression / Assessment and Plan / ED Course  I have reviewed the triage vital signs and the nursing notes.  Pertinent labs & imaging results that were available during my care of the patient were reviewed by me and considered in my medical decision making (see chart for details).  X-rays are negative.  This appears to be a sprain of the foot.  This will be treated with immobilization, rest, elevation, ice, and follow-up as needed.  Final Clinical Impressions(s) / ED Diagnoses   Final diagnoses:  None    ED Discharge Orders    None       14/06/2017, MD 02/28/18 1057

## 2018-02-28 NOTE — ED Triage Notes (Signed)
Pt states yesterday she was walking and missed a stair. Pt states that her right foot turned and she has been unable to walk on it since. Pt states that the most comfortable position to have her foot in is turned.

## 2018-02-28 NOTE — Discharge Instructions (Addendum)
Wear cam walker as applied for the next several days.  Weightbearing as tolerated.  Keep your leg elevated.  Ice for 20 minutes every 2 hours while awake for the next 2 days.  Follow-up with your primary doctor if symptoms significantly worsen or change.

## 2018-08-27 ENCOUNTER — Other Ambulatory Visit: Payer: Self-pay | Admitting: Pain Medicine

## 2018-08-27 DIAGNOSIS — M542 Cervicalgia: Secondary | ICD-10-CM

## 2018-08-27 DIAGNOSIS — M79602 Pain in left arm: Secondary | ICD-10-CM

## 2018-08-27 DIAGNOSIS — M549 Dorsalgia, unspecified: Secondary | ICD-10-CM

## 2018-09-11 ENCOUNTER — Ambulatory Visit
Admission: RE | Admit: 2018-09-11 | Discharge: 2018-09-11 | Disposition: A | Discharge status: Home / Self Care | Payer: 59 | Source: Ambulatory Visit | Attending: Pain Medicine | Admitting: Pain Medicine

## 2018-09-11 ENCOUNTER — Other Ambulatory Visit: Payer: Self-pay

## 2018-09-11 DIAGNOSIS — M79602 Pain in left arm: Secondary | ICD-10-CM

## 2018-09-11 DIAGNOSIS — M542 Cervicalgia: Secondary | ICD-10-CM

## 2018-09-11 DIAGNOSIS — M549 Dorsalgia, unspecified: Secondary | ICD-10-CM

## 2018-11-22 NOTE — Progress Notes (Signed)
Office Visit Note  Patient: [name]             Date of Birth: 1991/01/05           MRN: [id number]             PCP: Jonathon Jordan, MD Referring: Jonathon Jordan, MD Visit Date: 12/06/2018 Occupation: [employer] of [employer], Psych major  Subjective:  Pain in multiple joints.   History of Present Illness: [name] is a 28 y.o. female seen in consultation per request of Dr. Cheron Schaumann.  According to patient she has had history of lower back pain since 2013.  She has been followed by pain management for facet joint arthropathy.  She states she gets facet joint injections every 6 months.  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  At that time she had complete autoimmune panel according to patient it was negative.  In February she started experiencing increased pain in her neck, thoracic region and her shoulders.  She was also having discomfort in her both hands.  She went to pain management where they did MRI of her cervical and thoracic spine.  According to patient the MRI of the cervical and thoracic spine were unremarkable.  She also had EMG and nerve conduction velocity based on that she had a right carpal tunnel syndrome.  We do not have report available.  She continues to have discomfort in her entire spine and her hands.  She also gives history of a fall approximately 5 years ago when she injured her knee joints.  She was seen at [facility] where she was told that she had patellofemoral syndrome.  She continues to have discomfort in her knee joints as well.  She also gives history of muscle pain in her upper and lower extremities.  Activities of Daily Living:  Patient reports morning stiffness for 45 minutes.   Patient Reports nocturnal pain.  Difficulty dressing/grooming: Denies Difficulty climbing stairs: Reports Difficulty getting out of chair: Denies Difficulty using  hands for taps, buttons, cutlery, and/or writing: Reports  Review of Systems  Constitutional: Positive for fatigue. Negative for night sweats, weight gain and weight loss.  HENT: Positive for mouth sores. Negative for trouble swallowing, trouble swallowing, mouth dryness and nose dryness.   Eyes: Negative for pain, redness, visual disturbance and dryness.  Respiratory: Negative for cough, shortness of breath and difficulty breathing.   Cardiovascular: Negative for chest pain, palpitations, hypertension, irregular heartbeat and swelling in legs/feet.  Gastrointestinal: Positive for constipation. Negative for blood in stool and diarrhea.  Endocrine: Negative for increased urination.  Genitourinary: Negative for vaginal dryness.  Musculoskeletal: Positive for arthralgias, joint pain, myalgias, morning stiffness and myalgias. Negative for joint swelling, muscle weakness and muscle tenderness.  Skin: Negative for color change, rash, hair loss, skin tightness, ulcers and sensitivity to sunlight.  Allergic/Immunologic: Negative for susceptible to infections.  Neurological: Positive for parasthesias. Negative for dizziness, memory loss, night sweats and weakness.  Hematological: Negative for swollen glands.  Psychiatric/Behavioral: Positive for depressed mood and sleep disturbance. The patient is nervous/anxious.     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[place]) 12/30/2010  Appetite disorder 12/30/2010   BP (high blood pressure) 12/30/2010   Muscle ache 12/30/2010   Cachectic ([place]) 12/30/2010   Patellofemoral disorder of left knee 09/03/2010   Diabetes mellitus 09/03/2010    Past Medical History:  Diagnosis Date   Anemia    Arthritis    Asthma     Diabetes mellitus without complication ([place])    Hypertension    Vitamin D deficiency     Family History  Problem Relation Age of Onset   Diabetes Maternal Grandmother    Asthma Sister    ADD / ADHD Sister    Past Surgical History:  Procedure Laterality Date   GASTRIC BYPASS  07/2017   Social History   Social History Narrative   Not on file    There is no immunization history on file for this patient.   Objective: Vital Signs: BP (!) 141/92 (BP Location: Left Wrist, Patient Position: Sitting, Cuff Size: Normal)    Pulse (!) 105    Resp 14    Ht 5' 3.75" (1.619 m)    Wt 275 lb (124.7 kg)    LMP 11/29/2018    BMI 47.57 kg/m    Physical Exam Vitals signs and nursing note reviewed.  Constitutional:      Appearance: She is well-developed.  HENT:     Head: Normocephalic and atraumatic.  Eyes:     Conjunctiva/sclera: Conjunctivae normal.  Neck:     Musculoskeletal: Normal range of motion.  Cardiovascular:     Rate and Rhythm: Normal rate and regular rhythm.     Heart sounds: Normal heart sounds.  Pulmonary:     Effort: Pulmonary effort is normal.     Breath sounds: Normal breath sounds.  Abdominal:     General: Bowel sounds are normal.     Palpations: Abdomen is soft.  Lymphadenopathy:     Cervical: No cervical adenopathy.  Skin:    General: Skin is warm and dry.     Capillary Refill: Capillary refill takes less than 2 seconds.  Neurological:     Mental Status: She is alert and oriented to person, place, and time.  Psychiatric:        Behavior: Behavior normal.      Musculoskeletal Exam: Patient has good range of motion in her cervical thoracic lumbar spine.  Although she did have discomfort range of motion of her lumbar spine.  She had discomfort range of motion of her shoulder joints.  Elbow joints wrist joint MCPs PIPs DIPs with good range of motion with no synovitis.  Hip joints, knee joints, ankles, MTPs and PIPs with good range of motion with no  synovitis.  She has some generalized hyperalgesia and positive tender points.  She had tenderness over bilateral trochanteric area.  CDAI Exam: CDAI Score: -- Patient Global: --; Provider Global: -- Swollen: --; Tender: -- Joint Exam   No joint exam has been documented for this visit   There is currently no information documented on the homunculus. Go to the Rheumatology activity and complete the homunculus joint exam.  Investigation: No additional findings.  Imaging: Xr Hand 2 View Left  Result Date: 12/06/2018 No MCP, PIP, DIP, intercarpal radiocarpal joint space narrowing was noted.  No erosive changes were noted. Impression: Unremarkable x-ray of the hand.  Xr Hand 2 View Right  Result Date: 12/06/2018 No MCP, PIP, DIP, intercarpal radiocarpal joint space narrowing was noted.  No erosive changes were noted. Impression: Unremarkable x-ray of the hand.  Xr Lumbar Spine  2-3 Views  Result Date: 12/06/2018 No significant disc space narrowing or facet joint arthropathy was noted.  No syndesmophytes were noted.  SI joints were normal. Impression: Unremarkable x-ray of the lumbar spine.  Xr Pelvis 1-2 Views  Result Date: 12/06/2018 No SI joint to sclerosis or narrowing was noted. Impression: Unremarkable x-ray of the SI joints.  Xr Shoulder Left  Result Date: 12/06/2018 No glenohumeral or acromioclavicular joint space narrowing was noted.  No chondrocalcinosis was noted. Impression: Unremarkable x-ray of the shoulder.  Xr Shoulder Right  Result Date: 12/06/2018 No glenohumeral or acromioclavicular joint space narrowing was noted.  No chondrocalcinosis was noted. Impression: Unremarkable x-ray of the shoulder.   Recent Labs: Lab Results  Component Value Date   WBC 5.1 01/06/2011   HGB 11.7 (L) 01/06/2011   PLT 256 01/06/2011   NA 138 01/06/2011   K 3.1 (L) 01/06/2011   CL 101 01/06/2011   CO2 28 01/06/2011   GLUCOSE 179 (H) 01/06/2011   BUN 8 01/06/2011   CREATININE  0.38 (L) 01/06/2011   BILITOT 2.0 (H) 02/01/2007   ALKPHOS 125 02/01/2007   AST 14 02/01/2007   ALT 17 02/01/2007   PROT 9.3 (H) 02/01/2007   ALBUMIN 4.3 02/01/2007   CALCIUM 9.4 01/06/2011   GFRAA >90 01/06/2011    Speciality Comments: No specialty comments available.  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  She states she gets frequent facet joint injections.  She gives history of significant morning stiffness.- Plan: XR Lumbar Spine 2-3 Views.  The x-ray of the lumbar spine was unremarkable.  Chronic pain of both shoulders -she complains of pain in bilateral shoulder joints for approximately 6 months now.  Plan: XR Shoulder Left, XR Shoulder Right.  X-ray of bilateral shoulders were unremarkable.  Pain in both hands -she has pain in her both hands.  No warmth swelling effusion or synovitis was noted.  She states she had recent nerve conduction velocities which were consistent with carpal tunnel syndrome in her right hand and some neuropathy in her left hand.  Plan: XR Hand 2 View Right, XR Hand 2 View Left, the x-ray of bilateral hands were unremarkable.  Rheumatoid factor, Cyclic citrul peptide antibody, IgG, 14-3-3 eta Protein, Angiotensin converting enzyme, Sedimentation rate  Carpal tunnel syndrome, right upper limb-I do not have the results of nerve conduction velocities.  Have advised her to bring the results at follow-up visit.  Patellofemoral disorder of left knee-patient reports having a fall few years back and had evaluation at [facility].  She was diagnosed with patellofemoral syndrome according to records.  She had no warmth swelling or effusion.  Chronic SI joint pain -she has some tenderness over SI joints.  Plan: XR Pelvis 1-2 Views, HLA-B27  antigen.  The x-ray of the SI joint did not show any sclerosis or narrowing.  Patient does not have any clinical features of ankylosing spondylitis on examination.  False positive syphilis serology - Plan: ANA, Lupus Anticoagulant Eval w/Reflex, Cardiolipin antibodies, IgG, IgM, IgA, Beta-2 glycoprotein antibodies  Chronic pain syndrome-according to the pain management records she has history of chronic pain syndrome and is followed by pain management.  Other medical problems are listed as follows:  Type 1 diabetes mellitus with diabetic polyneuropathy ([place])  Diabetic gastroparesis ([place])  History  of gastroesophageal reflux (GERD)  History of iron deficiency anemia  Vitamin D deficiency  Vitamin B12 deficiency  Other insomnia  History of asthma  History of depression  BMI 45.0-49.9, adult ([place])  Orders: Orders Placed This Encounter  Procedures   XR Hand 2 View Right   XR Hand 2 View Left   XR Shoulder Left   XR Shoulder Right   XR Lumbar Spine 2-3 Views   XR Pelvis 1-2 Views   Rheumatoid factor   Cyclic citrul peptide antibody, IgG   ANA   14-3-3 eta Protein   HLA-B27 antigen   Angiotensin converting enzyme   Sedimentation rate   Lupus Anticoagulant Eval w/Reflex   Cardiolipin antibodies, IgG, IgM, IgA   Beta-2 glycoprotein antibodies   No orders of the defined types were placed in this encounter.   Face-to-face time spent with patient was 60 minutes. Greater than 50% of time was spent in counseling and coordination of care.  Follow-Up Instructions: Return for Polyarthralgia.   Bo Merino, MD  Note - This record has been created using Editor, commissioning.  Chart creation errors have been sought, but may not always  have been located. Such creation errors do not reflect on  the standard of medical care.

## 2018-12-06 ENCOUNTER — Ambulatory Visit: Payer: Self-pay

## 2018-12-06 ENCOUNTER — Other Ambulatory Visit: Payer: Self-pay

## 2018-12-06 ENCOUNTER — Ambulatory Visit (INDEPENDENT_AMBULATORY_CARE_PROVIDER_SITE_OTHER): Payer: No Typology Code available for payment source | Admitting: Rheumatology

## 2018-12-06 ENCOUNTER — Encounter: Payer: Self-pay | Admitting: Rheumatology

## 2018-12-06 VITALS — BP 141/92 | HR 105 | Resp 14 | Ht 63.75 in | Wt 275.0 lb

## 2018-12-06 DIAGNOSIS — G4709 Other insomnia: Secondary | ICD-10-CM

## 2018-12-06 DIAGNOSIS — Z862 Personal history of diseases of the blood and blood-forming organs and certain disorders involving the immune mechanism: Secondary | ICD-10-CM

## 2018-12-06 DIAGNOSIS — M4306 Spondylolysis, lumbar region: Secondary | ICD-10-CM

## 2018-12-06 DIAGNOSIS — G5601 Carpal tunnel syndrome, right upper limb: Secondary | ICD-10-CM

## 2018-12-06 DIAGNOSIS — Z6841 Body Mass Index (BMI) 40.0 and over, adult: Secondary | ICD-10-CM

## 2018-12-06 DIAGNOSIS — M25511 Pain in right shoulder: Secondary | ICD-10-CM

## 2018-12-06 DIAGNOSIS — M79641 Pain in right hand: Secondary | ICD-10-CM

## 2018-12-06 DIAGNOSIS — E1143 Type 2 diabetes mellitus with diabetic autonomic (poly)neuropathy: Secondary | ICD-10-CM

## 2018-12-06 DIAGNOSIS — Z8659 Personal history of other mental and behavioral disorders: Secondary | ICD-10-CM

## 2018-12-06 DIAGNOSIS — Z8719 Personal history of other diseases of the digestive system: Secondary | ICD-10-CM

## 2018-12-06 DIAGNOSIS — G8929 Other chronic pain: Secondary | ICD-10-CM

## 2018-12-06 DIAGNOSIS — M79642 Pain in left hand: Secondary | ICD-10-CM | POA: Diagnosis not present

## 2018-12-06 DIAGNOSIS — R768 Other specified abnormal immunological findings in serum: Secondary | ICD-10-CM

## 2018-12-06 DIAGNOSIS — Z8709 Personal history of other diseases of the respiratory system: Secondary | ICD-10-CM

## 2018-12-06 DIAGNOSIS — G894 Chronic pain syndrome: Secondary | ICD-10-CM

## 2018-12-06 DIAGNOSIS — E538 Deficiency of other specified B group vitamins: Secondary | ICD-10-CM

## 2018-12-06 DIAGNOSIS — E1042 Type 1 diabetes mellitus with diabetic polyneuropathy: Secondary | ICD-10-CM

## 2018-12-06 DIAGNOSIS — M25512 Pain in left shoulder: Secondary | ICD-10-CM

## 2018-12-06 DIAGNOSIS — M533 Sacrococcygeal disorders, not elsewhere classified: Secondary | ICD-10-CM

## 2018-12-06 DIAGNOSIS — E559 Vitamin D deficiency, unspecified: Secondary | ICD-10-CM

## 2018-12-06 DIAGNOSIS — K3184 Gastroparesis: Secondary | ICD-10-CM

## 2018-12-06 DIAGNOSIS — M222X2 Patellofemoral disorders, left knee: Secondary | ICD-10-CM

## 2018-12-10 NOTE — Progress Notes (Signed)
I will discuss results at the follow-up visit.

## 2018-12-10 NOTE — Progress Notes (Deleted)
Office Visit Note  Patient: [name]             Date of Birth: 1990/10/04           MRN: [id number]             PCP: Jonathon Jordan, MD Referring: Jonathon Jordan, MD Visit Date: 12/24/2018 Occupation: '@GUAROCC' @  Subjective:  No chief complaint on file.   History of Present Illness: [name] is a 28 y.o. female ***   Activities of Daily Living:  Patient reports morning stiffness for *** {minute/hour:19697}.   Patient {ACTIONS;DENIES/REPORTS:21021675::"Denies"} nocturnal pain.  Difficulty dressing/grooming: {ACTIONS;DENIES/REPORTS:21021675::"Denies"} Difficulty climbing stairs: {ACTIONS;DENIES/REPORTS:21021675::"Denies"} Difficulty getting out of chair: {ACTIONS;DENIES/REPORTS:21021675::"Denies"} Difficulty using hands for taps, buttons, cutlery, and/or writing: {ACTIONS;DENIES/REPORTS:21021675::"Denies"}  No Rheumatology ROS completed.   PMFS History:  Patient Active Problem List   Diagnosis Date Noted  . Chronic low back pain 11/22/2013  . Clinical depression 10/07/2013  . Extreme obesity 10/07/2013  . Diabetic neuropathy ([place]) 08/06/2013  . General patient noncompliance 12/21/2012  . 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 Appetite disorder 12/30/2010  . BP (high blood pressure) 12/30/2010  . Muscle ache 12/30/2010  . Cachectic ([place]) 12/30/2010  . 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 Diabetes mellitus 09/03/2010    Past Medical History:  Diagnosis Date  . Anemia   . Arthritis   . Asthma   . Diabetes mellitus without complication ([place])   . Hypertension   . Vitamin D deficiency     Family History  Problem Relation Age of Onset  . Diabetes Maternal Grandmother   . Asthma Sister   . ADD / ADHD Sister    Past Surgical History:  Procedure Laterality Date  . GASTRIC BYPASS  07/2017   Social History   Social History Narrative  . Not on file    There is no immunization history on file for this patient.   Objective: Vital Signs:  LMP 11/29/2018    Physical Exam   Musculoskeletal Exam: ***  CDAI Exam: CDAI Score: - Patient Global: -; Provider Global: - Swollen: -; Tender: - Joint Exam   No joint exam has been documented for this visit   There is currently no information documented on the homunculus. Go to the Rheumatology activity and complete the homunculus joint exam.  Investigation: No additional findings.  Imaging: Xr Hand 2 View Left  Result Date: 12/06/2018 No MCP, PIP, DIP, intercarpal radiocarpal joint space narrowing was noted.  No erosive changes were noted. Impression: Unremarkable x-ray of the hand.  Xr Hand 2 View Right  Result Date: 12/06/2018 No MCP, PIP, DIP, intercarpal radiocarpal joint space narrowing was noted.  No erosive changes were noted. Impression: Unremarkable x-ray of the hand.  Xr Lumbar Spine 2-3 Views  Result Date: 12/06/2018 No significant disc space narrowing or facet joint arthropathy was noted.  No syndesmophytes were noted.  SI joints were normal. Impression: Unremarkable x-ray of the lumbar spine.  Xr Pelvis 1-2 Views  Result Date: 12/06/2018 No SI joint to sclerosis or narrowing was noted. Impression: Unremarkable x-ray of the SI joints.  Xr Shoulder Left  Result Date: 12/06/2018 No glenohumeral or acromioclavicular joint space narrowing was noted.  No chondrocalcinosis was noted. Impression: Unremarkable x-ray of the shoulder.  Xr Shoulder Right  Result Date: 12/06/2018 No glenohumeral or acromioclavicular joint space narrowing was noted.  No chondrocalcinosis was noted. Impression: Unremarkable x-ray of the shoulder.   Recent Labs: Lab Results  Component Value Date  WBC 5.1 01/06/2011   HGB 11.7 (L) 01/06/2011   PLT 256 01/06/2011   NA 138 01/06/2011   K 3.1 (L) 01/06/2011   CL 101 01/06/2011   CO2 28 01/06/2011   GLUCOSE 179 (H) 01/06/2011   BUN 8 01/06/2011   CREATININE 0.38 (L) 01/06/2011   BILITOT 2.0 (H) 02/01/2007   ALKPHOS 125 02/01/2007    AST 14 02/01/2007   ALT 17 02/01/2007   PROT 9.3 (H) 02/01/2007   ALBUMIN 4.3 02/01/2007   CALCIUM 9.4 01/06/2011   GFRAA >90 01/06/2011  December 06, 2018 lupus anticoagulant negative, anticardiolipin negative, beta-2 GP 1-, ANA negative, RF negative, anti-CCP negative, ACE 20, HLA-B27 negative, ESR 45  Speciality Comments: No specialty comments available.  Procedures:  No procedures performed Allergies: Fruit extracts and Zinc   Assessment / Plan:     Visit Diagnoses: Spondylolysis, lumbar region - X-ray obtained last visit was unremarkable.  Patient has history of chronic lower back pain and gets facet joint injections.  Chronic pain of both shoulders - X-rays were unremarkable.  Pain in both hands - X-rays were unremarkable.  All autoimmune work-up was negative.  ESR was elevated.  May consider ultrasound of bilateral hands.  Carpal tunnel syndrome, right upper limb - Patient had nerve conduction velocities in the past.  Patellofemoral disorder of left knee - [facility]ed at [facility]  Chronic SI joint pain - X-rays were unremarkable.  False positive syphilis serology - All autoimmune work-up is negative.  Chronic pain syndrome - Followed by Dr. Vira Blanco.   Other medical problems are listed as follows:  Type 1 diabetes mellitus with diabetic polyneuropathy ([facility])  Diabetic gastroparesis ([place])  History of gastroesophageal reflux (GERD)  History of iron deficiency anemia  Vitamin D deficiency  Vitamin B12 deficiency  Other insomnia  History of asthma  History of depression  BMI 45.0-49.9, adult ([place])  Orders: No orders of the defined types were placed in this encounter.  No orders of the defined types were placed in this encounter.   Face-to-face time spent with patient was *** minutes. Greater than 50% of time was spent in counseling and coordination of care.  Follow-Up Instructions: No follow-ups on file.   Bo Merino, MD   Note - This record has been created using Editor, commissioning.  Chart creation errors have been sought, but may not always  have been located. Such creation errors do not reflect on  the standard of medical care.

## 2018-12-13 ENCOUNTER — Encounter: Payer: Self-pay | Admitting: Rheumatology

## 2018-12-13 LAB — ANGIOTENSIN CONVERTING ENZYME: Angiotensin-Converting Enzyme: 20 U/L (ref 9–67)

## 2018-12-13 LAB — SEDIMENTATION RATE: Sed Rate: 45 mm/h — ABNORMAL HIGH (ref 0–20)

## 2018-12-13 LAB — RFX DRVVT 1:1 MIX

## 2018-12-13 LAB — ANA: Anti Nuclear Antibody (ANA): NEGATIVE

## 2018-12-13 LAB — LUPUS ANTICOAGULANT EVAL W/ REFLEX
PTT-LA Screen: 27 s (ref <=40)
dRVVT: 46 s — ABNORMAL HIGH (ref <=45)

## 2018-12-13 LAB — CARDIOLIPIN ANTIBODIES, IGG, IGM, IGA
Anticardiolipin IgA: <11 [APL'U]
Anticardiolipin IgG: <14 [GPL'U]
Anticardiolipin IgM: <12 [MPL'U]

## 2018-12-13 LAB — CYCLIC CITRUL PEPTIDE ANTIBODY, IGG: Cyclic Citrullin Peptide Ab: <16 UNITS

## 2018-12-13 LAB — RHEUMATOID FACTOR: Rheumatoid fact SerPl-aCnc: <14 IU/mL (ref <14)

## 2018-12-13 LAB — HLA-B27 ANTIGEN: HLA-B27 Antigen: NEGATIVE

## 2018-12-13 LAB — BETA-2 GLYCOPROTEIN ANTIBODIES
Beta-2 Glyco 1 IgA: <9 SAU (ref <=20)
Beta-2 Glyco 1 IgM: 9 SMU (ref <=20)
Beta-2 Glyco I IgG: <9 SGU (ref <=20)

## 2018-12-13 LAB — 14-3-3 ETA PROTEIN: 14-3-3 eta Protein: <0.2 ng/mL (ref <0.2)

## 2018-12-13 LAB — RFLX DRVVT CONFRIM: DRVVT CONFIRM: POSITIVE — AB

## 2018-12-13 NOTE — Telephone Encounter (Signed)
Patient has an appointment coming up in October.  If she wants an earlier appointment we have her labs available now.  We can see her in the office earlier.

## 2018-12-14 NOTE — Telephone Encounter (Signed)
Contacted patient and schedule patient for a sooner appointment.

## 2018-12-15 NOTE — Progress Notes (Signed)
Office Visit Note  Patient: [name]             Date of Birth: [birth year]           MRN: [id number]             PCP: Jonathon Jordan, MD Referring: Jonathon Jordan, MD Visit Date: 12/17/2018 Occupation: '@GUAROCC' @  Subjective:  Pain in multiple joints    History of Present Illness: [name] is a 28 y.o. female with history of spondylosis of the lumbar spine and polyarthralgia.  Patient reports she continues to have chronic back pain.  She states that the pain is constant.  She states that she gets injections performed at pain management.  She states that her symptoms of radiculopathy have resolved.  She continues to have chronic neck stiffness experiences muscle tension and tenderness in the trapezius muscles bilaterally.  She experiences pain at night and difficulty sleeping.  She has chronic fatigue related to insomnia.  She has intermittent pain and swelling in both hands.  She continues have symptoms of right carpal tunnel.  She has had nerve conduction studies performed at the pain management clinic but has not got the records yet.  She states that she experiences pain in the left knee joint.  She has attended physical therapy in the past which has helped.  She denies any joint swelling in the left knee joint. She denies any symptoms of raynaud's, facial rashes, or sores in mouth or nose.    Activities of Daily Living:  Patient reports morning stiffness for 30 minutes.   Patient Reports nocturnal pain.  Difficulty dressing/grooming: Denies Difficulty climbing stairs: Denies Difficulty getting out of chair: Denies Difficulty using hands for taps, buttons, cutlery, and/or writing: Denies  Review of Systems  Constitutional: Positive for fatigue.  HENT: Negative for mouth sores, mouth dryness and nose dryness.   Eyes: Positive for dryness. Negative for pain, itching and visual disturbance.  Respiratory: Negative for cough, hemoptysis, shortness of breath, wheezing and  difficulty breathing.   Cardiovascular: Negative for chest pain, palpitations, hypertension and swelling in legs/feet.  Gastrointestinal: Negative for blood in stool, constipation and diarrhea.  Endocrine: Negative for increased urination.  Genitourinary: Negative for difficulty urinating and painful urination.  Musculoskeletal: Positive for arthralgias, joint pain, joint swelling and morning stiffness. Negative for myalgias, muscle weakness, muscle tenderness and myalgias.  Skin: Positive for rash. Negative for color change, pallor, hair loss, nodules/bumps, skin tightness, ulcers and sensitivity to sunlight.  Allergic/Immunologic: Negative for susceptible to infections.  Neurological: Positive for headaches. Negative for dizziness, numbness and memory loss.  Hematological: Negative for swollen glands.  Psychiatric/Behavioral: Positive for sleep disturbance. Negative for depressed mood and confusion. The patient is not nervous/anxious.     PMFS History:  Patient Active Problem List   Diagnosis Date Noted  . Chronic low back pain 11/22/2013  . Clinical depression 10/07/2013  . Extreme obesity 10/07/2013  . Diabetic neuropathy ([place]) 08/06/2013  . General patient noncompliance 12/21/2012  . 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 Appetite disorder 12/30/2010  . BP (high blood pressure) 12/30/2010  . Muscle ache 12/30/2010  . Cachectic ([place]) 12/30/2010  . 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 Diabetes mellitus 09/03/2010    Past Medical History:  Diagnosis Date  . Anemia   . Arthritis   . Asthma   . Diabetes mellitus without complication ([place])   . Hypertension   . Vitamin D deficiency     Family History  Problem Relation Age of Onset  . Lupus Maternal Grandmother   . Asthma Sister   . ADD / ADHD Sister    Past Surgical History:  Procedure Laterality Date  . GASTRIC BYPASS  07/2017   Social History   Social History Narrative  . Not on file    There is no  immunization history on file for this patient.   Objective: Vital Signs: BP (!) 152/96 (BP Location: Left Wrist, Patient Position: Sitting, Cuff Size: Normal)   Pulse 93   Resp 14   Ht 5' 3.75" (1.619 m)   Wt 275 lb (124.7 kg)   LMP 11/29/2018   BMI 47.57 kg/m    Physical Exam Vitals signs and nursing note reviewed.  Constitutional:      Appearance: She is well-developed.  HENT:     Head: Normocephalic and atraumatic.  Eyes:     Conjunctiva/sclera: Conjunctivae normal.  Neck:     Musculoskeletal: Normal range of motion.  Cardiovascular:     Rate and Rhythm: Normal rate and regular rhythm.     Heart sounds: Normal heart sounds.  Pulmonary:     Effort: Pulmonary effort is normal.     Breath sounds: Normal breath sounds.  Abdominal:     General: Bowel sounds are normal.     Palpations: Abdomen is soft.  Lymphadenopathy:     Cervical: No cervical adenopathy.  Skin:    General: Skin is warm and dry.     Capillary Refill: Capillary refill takes less than 2 seconds.  Neurological:     Mental Status: She is alert and oriented to person, place, and time.  Psychiatric:        Behavior: Behavior normal.      Musculoskeletal Exam: C-spine, thoracic spine, and lumbar spine good ROM.  No midline spinal tenderness.  No SI joint tenderness.  Shoulder joints, elbow joints, wrist joints, MCPs, PIPs, and DIPs good ROM with no synovitis.  Complete fist formation bilaterally.  Tenderness of the right 2nd MCP joint.  Hip joints, knee joints, ankle joints, MTPs, PIPs, and DIPs good ROM with no synovitis.  No warmth or effusion of knee joints.  No tenderness or swelling of ankle joints.   CDAI Exam: CDAI Score: - Patient Global: -; Provider Global: - Swollen: -; Tender: - Joint Exam   No joint exam has been documented for this visit   There is currently no information documented on the homunculus. Go to the Rheumatology activity and complete the homunculus joint exam.  Investigation:  No additional findings.  Imaging: Xr Hand 2 View Left  Result Date: 12/06/2018 No MCP, PIP, DIP, intercarpal radiocarpal joint space narrowing was noted.  No erosive changes were noted. Impression: Unremarkable x-ray of the hand.  Xr Hand 2 View Right  Result Date: 12/06/2018 No MCP, PIP, DIP, intercarpal radiocarpal joint space narrowing was noted.  No erosive changes were noted. Impression: Unremarkable x-ray of the hand.  Xr Lumbar Spine 2-3 Views  Result Date: 12/06/2018 No significant disc space narrowing or facet joint arthropathy was noted.  No syndesmophytes were noted.  SI joints were normal. Impression: Unremarkable x-ray of the lumbar spine.  Xr Pelvis 1-2 Views  Result Date: 12/06/2018 No SI joint to sclerosis or narrowing was noted. Impression: Unremarkable x-ray of the SI joints.  Xr Shoulder Left  Result Date: 12/06/2018 No glenohumeral or acromioclavicular joint space narrowing was noted.  No chondrocalcinosis was noted. Impression: Unremarkable x-ray of the shoulder.  Xr Shoulder Right  Result Date: 12/06/2018 No glenohumeral or acromioclavicular joint space narrowing was noted.  No chondrocalcinosis was noted. Impression: Unremarkable x-ray of the shoulder.   Recent Labs: Lab Results  Component Value Date   WBC 5.1 01/06/2011   HGB 11.7 (L) 01/06/2011   PLT 256 01/06/2011   NA 138 01/06/2011   K 3.1 (L) 01/06/2011   CL 101 01/06/2011   CO2 28 01/06/2011   GLUCOSE 179 (H) 01/06/2011   BUN 8 01/06/2011   CREATININE 0.38 (L) 01/06/2011   BILITOT 2.0 (H) 02/01/2007   ALKPHOS 125 02/01/2007   AST 14 02/01/2007   ALT 17 02/01/2007   PROT 9.3 (H) 02/01/2007   ALBUMIN 4.3 02/01/2007   CALCIUM 9.4 01/06/2011   GFRAA >90 01/06/2011  Lupus anticoagulant negative, beta-2 negative, anti-CCP negative, ANA negative, ACE negative, RF negative, anti-CCP negative, '14 3 3 ' eta negative, HLA-B27 negative, ESR 45  Speciality Comments: No specialty comments available.   Procedures:  No procedures performed Allergies: Fruit extracts and Zinc   Assessment / Plan:     Visit Diagnoses: Spondylolysis, lumbar region - X-rays were unremarkable at the last visit.  Patient has been going to pain management for the last 10 years.  She is followed by Dr. Vira Blanco.  She has injections on a regular basis.  She has no symptoms of radiculopathy at this time.  She has gone to physical therapy in the past.  She plans on continuing to follow up with Dr. Vira Blanco   Chronic pain of both shoulders - X-rays were unremarkable on 12/06/2018.  She has good range of motion on examination today.  She does have trapezius muscle tension and muscle tenderness bilaterally.  Carpal tunnel syndrome, right upper limb -She had a nerve conduction velocity study performed at Dr. Jodene Nam office in the past.  She experiences intermittent paresthesias.  Pain in both hands - X-rays were unremarkable on 12/06/18.  All autoimmune work-up is negative.  Sed rate 45 on 12/06/2018.  Lab work was reviewed with the patient and all questions were discussed.  She continues to have intermittent pain and swelling in both hands.  She has tenderness of the right second MCP joint but no synovitis was noted today.  She states that she often has difficulty wearing rings due to the pain and swelling.  She also experiences stiffness in both hands.  We will schedule ultrasound of both hands to assess for synovitis.  Patellofemoral disorder of left knee - Followed by [facility].  She has good range of motion with no discomfort at this time.  No warmth or effusion noted.  She has been to physical therapy in the past which has helped with the discomfort.  Chronic SI joint pain - X-rays were unremarkable on 12/06/18.  HLA-B27 was negative.  False positive syphilis serology - Lupus anticoagulant, anticardiolipin and beta-2 GP 1 were negative.  ANA was negative.  Chronic pain syndrome - Followed by pain management.  Other  medical conditions are listed as follows:   History of depression  BMI 45.0-49.9, adult (HCC)-weight loss diet and exercise was emphasized.  Type 1 diabetes mellitus with diabetic polyneuropathy (HCC)  Diabetic gastroparesis (HCC)  History of gastroesophageal reflux (GERD)  History of asthma  Other insomnia  History of iron deficiency anemia  Vitamin B12 deficiency  Vitamin D deficiency  Orders: No orders of the defined types were placed in this encounter.  No orders of the defined types were placed in this encounter.    Follow-Up Instructions: Return  for Polyarthralgia.   Ofilia Neas, PA-C   I examined and evaluated the patient with Hazel Sams PA.  All autoimmune work-up was negative.  She has elevated sedimentation rate.  She continues to have discomfort in her hands.  No synovitis was noted on my examination.  We will schedule ultrasound to evaluate this further.  The plan of care was discussed as noted above.  Bo Merino, MD  Note - This record has been created using Editor, commissioning.  Chart creation errors have been sought, but may not always  have been located. Such creation errors do not reflect on  the standard of medical care.

## 2018-12-17 ENCOUNTER — Ambulatory Visit (INDEPENDENT_AMBULATORY_CARE_PROVIDER_SITE_OTHER): Payer: No Typology Code available for payment source | Admitting: Rheumatology

## 2018-12-17 ENCOUNTER — Encounter: Payer: Self-pay | Admitting: Physician Assistant

## 2018-12-17 ENCOUNTER — Other Ambulatory Visit: Payer: Self-pay

## 2018-12-17 VITALS — BP 152/96 | HR 93 | Resp 14 | Ht 63.75 in | Wt 275.0 lb

## 2018-12-17 DIAGNOSIS — R768 Other specified abnormal immunological findings in serum: Secondary | ICD-10-CM

## 2018-12-17 DIAGNOSIS — M25511 Pain in right shoulder: Secondary | ICD-10-CM | POA: Diagnosis not present

## 2018-12-17 DIAGNOSIS — Z8719 Personal history of other diseases of the digestive system: Secondary | ICD-10-CM

## 2018-12-17 DIAGNOSIS — M25512 Pain in left shoulder: Secondary | ICD-10-CM

## 2018-12-17 DIAGNOSIS — Z862 Personal history of diseases of the blood and blood-forming organs and certain disorders involving the immune mechanism: Secondary | ICD-10-CM

## 2018-12-17 DIAGNOSIS — G4709 Other insomnia: Secondary | ICD-10-CM

## 2018-12-17 DIAGNOSIS — M79642 Pain in left hand: Secondary | ICD-10-CM

## 2018-12-17 DIAGNOSIS — M4306 Spondylolysis, lumbar region: Secondary | ICD-10-CM

## 2018-12-17 DIAGNOSIS — G894 Chronic pain syndrome: Secondary | ICD-10-CM

## 2018-12-17 DIAGNOSIS — G5601 Carpal tunnel syndrome, right upper limb: Secondary | ICD-10-CM

## 2018-12-17 DIAGNOSIS — E1143 Type 2 diabetes mellitus with diabetic autonomic (poly)neuropathy: Secondary | ICD-10-CM

## 2018-12-17 DIAGNOSIS — Z6841 Body Mass Index (BMI) 40.0 and over, adult: Secondary | ICD-10-CM

## 2018-12-17 DIAGNOSIS — M79641 Pain in right hand: Secondary | ICD-10-CM

## 2018-12-17 DIAGNOSIS — M533 Sacrococcygeal disorders, not elsewhere classified: Secondary | ICD-10-CM

## 2018-12-17 DIAGNOSIS — E559 Vitamin D deficiency, unspecified: Secondary | ICD-10-CM

## 2018-12-17 DIAGNOSIS — K3184 Gastroparesis: Secondary | ICD-10-CM

## 2018-12-17 DIAGNOSIS — G8929 Other chronic pain: Secondary | ICD-10-CM

## 2018-12-17 DIAGNOSIS — E1042 Type 1 diabetes mellitus with diabetic polyneuropathy: Secondary | ICD-10-CM

## 2018-12-17 DIAGNOSIS — Z8709 Personal history of other diseases of the respiratory system: Secondary | ICD-10-CM

## 2018-12-17 DIAGNOSIS — M222X2 Patellofemoral disorders, left knee: Secondary | ICD-10-CM

## 2018-12-17 DIAGNOSIS — E538 Deficiency of other specified B group vitamins: Secondary | ICD-10-CM

## 2018-12-17 DIAGNOSIS — Z8659 Personal history of other mental and behavioral disorders: Secondary | ICD-10-CM

## 2018-12-24 ENCOUNTER — Ambulatory Visit: Payer: 59 | Admitting: Rheumatology

## 2018-12-24 ENCOUNTER — Ambulatory Visit: Payer: 59 | Admitting: Physician Assistant

## 2018-12-25 ENCOUNTER — Ambulatory Visit: Payer: 59 | Admitting: Rheumatology

## 2018-12-26 ENCOUNTER — Other Ambulatory Visit: Payer: Self-pay

## 2018-12-26 ENCOUNTER — Ambulatory Visit (INDEPENDENT_AMBULATORY_CARE_PROVIDER_SITE_OTHER): Payer: No Typology Code available for payment source | Admitting: Rheumatology

## 2018-12-26 ENCOUNTER — Ambulatory Visit: Payer: Self-pay

## 2018-12-26 DIAGNOSIS — M79642 Pain in left hand: Secondary | ICD-10-CM | POA: Diagnosis not present

## 2018-12-26 DIAGNOSIS — M79641 Pain in right hand: Secondary | ICD-10-CM | POA: Diagnosis not present

## 2018-12-27 NOTE — Progress Notes (Signed)
Office Visit Note  Patient: [name]             Date of Birth: Jul 04, 1990           MRN: [id number]             PCP: [name], MD Referring: [name], MD Visit Date: 01/02/2019 Occupation: @GUAROCC @  Subjective:  Pain in multiple joints  History of Present Illness: [name] is a 28 y.o. female with history of seronegative rheumatoid arthritis.  She continues to have pain in both hands, both shoulder joints, both knee joints, and both ankle joints.  She states that she notices swelling in her hands and intermittently in bilateral ankle joints.  She had an ultrasound performed on 12/26/2018 which revealed active inflammation.  She presented today to discuss treatment options.  Activities of Daily Living:  Patient reports morning stiffness for several hours.   Patient Reports nocturnal pain.  Difficulty dressing/grooming: Reports Difficulty climbing stairs: Denies Difficulty getting out of chair: Denies Difficulty using hands for taps, buttons, cutlery, and/or writing: Denies  Review of Systems  Constitutional: Positive for fatigue.  HENT: Negative for mouth sores, mouth dryness and nose dryness.   Eyes: Positive for dryness. Negative for itching.  Respiratory: Negative for shortness of breath, wheezing and difficulty breathing.   Cardiovascular: Negative for chest pain and palpitations.  Gastrointestinal: Negative for abdominal pain, blood in stool, constipation and diarrhea.  Endocrine: Negative for increased urination.  Genitourinary: Negative for difficulty urinating and painful urination.  Musculoskeletal: Positive for arthralgias, joint pain, joint swelling and morning stiffness.  Skin: Positive for rash.  Allergic/Immunologic: Negative for susceptible to infections.  Neurological: Positive for headaches. Negative for dizziness, numbness, memory loss and weakness.  Hematological: Negative for swollen glands.  Psychiatric/Behavioral: Positive for  sleep disturbance. Negative for depressed mood and confusion. The patient is nervous/anxious.     PMFS History:  Patient Active Problem List   Diagnosis Date Noted  . Chronic low back pain 11/22/2013  . Clinical depression 10/07/2013  . Extreme obesity 10/07/2013  . Diabetic neuropathy (HCC) 08/06/2013  . General patient noncompliance 12/21/2012  . 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 Appetite disorder 12/30/2010  . BP (high blood pressure) 12/30/2010  . Muscle ache 12/30/2010  . Cachectic (HCC) 12/30/2010  . 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 Diabetes mellitus 09/03/2010    Past Medical History:  Diagnosis Date  . Anemia   . Arthritis   . Asthma   . Diabetes mellitus without complication (HCC)   . Hypertension   . Vitamin D deficiency     Family History  Problem Relation Age of Onset  . Lupus Maternal Grandmother   . Asthma Sister   . ADD / ADHD Sister    Past Surgical History:  Procedure Laterality Date  . GASTRIC BYPASS  07/2017   Social History   Social History Narrative  . Not on file    There is no immunization history on file for this patient.   Objective: Vital Signs: BP (!) 173/102 (BP Location: Left Wrist, Patient Position: Sitting, Cuff Size: Normal)   Pulse 77   Resp 14   Ht 5' 3.75" (1.619 m)   Wt 281 lb (127.5 kg)   BMI 48.61 kg/m    Physical Exam Vitals signs and nursing note reviewed.  Constitutional:      Appearance: She is well-developed.  HENT:     Head: Normocephalic and atraumatic.  Eyes:  Conjunctiva/sclera: Conjunctivae normal.  Neck:     Musculoskeletal: Normal range of motion.  Cardiovascular:     Rate and Rhythm: Normal rate and regular rhythm.     Heart sounds: Normal heart sounds.  Pulmonary:     Effort: Pulmonary effort is normal.     Breath sounds: Normal breath sounds.  Abdominal:     General: Bowel sounds are normal.     Palpations: Abdomen is soft.  Lymphadenopathy:     Cervical: No cervical  adenopathy.  Skin:    General: Skin is warm and dry.     Capillary Refill: Capillary refill takes less than 2 seconds.  Neurological:     Mental Status: She is alert and oriented to person, place, and time.  Psychiatric:        Behavior: Behavior normal.      Musculoskeletal Exam: C-spine, thoracic spine, lumbar spine good range of motion with discomfort.  Shoulder joints have good range of motion with discomfort bilaterally.  Elbow joints, wrist joints, MCPs, PIPs, DIPs good range of motion no obvious synovitis.  She has tenderness of bilateral wrist joints.  He has tenderness of the right second MCP joint.  Hip joints, knee joints and ankle joints, MCPs, PIPs, DIPs good range of motion with no synovitis.  No warmth or effusion of bilateral knee joints.  No tenderness of both ankle joints.  CDAI Exam: CDAI Score: - Patient Global: -; Provider Global: - Swollen: 0 ; Tender: 5  Joint Exam      Right  Left  Wrist   Tender   Tender  MCP 2   Tender     Ankle   Tender   Tender     Investigation: No additional findings.  Imaging: [place] Extrem Up Bilat Comp  Result Date: 12/26/2018 Ultrasound examination of bilateral hands was performed per EULAR recommendations. Using 12 MHz transducer, grayscale and power Doppler bilateral second, third, and fifth MCP joints and bilateral wrist joints both dorsal and volar aspects were evaluated to look for synovitis or tenosynovitis. The findings were there was increased Doppler signal in bilateral wrist joints and left MCP joint consistent with synovitis  on ultrasound examination. Right median nerve was 0.15 cm squares which was more than upper limits of normal and left median nerve was bifid and 0.09 cm squares which was within normal limits. Impression: Ultrasound examination showed synovitis in bilateral wrist joints and left MCP joint consistent with inflammatory arthritis.  Right median nerve is enlarged.  Xr Hand 2 View Left  Result Date:  12/06/2018 No MCP, PIP, DIP, intercarpal radiocarpal joint space narrowing was noted.  No erosive changes were noted. Impression: Unremarkable x-ray of the hand.  Xr Hand 2 View Right  Result Date: 12/06/2018 No MCP, PIP, DIP, intercarpal radiocarpal joint space narrowing was noted.  No erosive changes were noted. Impression: Unremarkable x-ray of the hand.  Xr Lumbar Spine 2-3 Views  Result Date: 12/06/2018 No significant disc space narrowing or facet joint arthropathy was noted.  No syndesmophytes were noted.  SI joints were normal. Impression: Unremarkable x-ray of the lumbar spine.  Xr Pelvis 1-2 Views  Result Date: 12/06/2018 No SI joint to sclerosis or narrowing was noted. Impression: Unremarkable x-ray of the SI joints.  Xr Shoulder Left  Result Date: 12/06/2018 No glenohumeral or acromioclavicular joint space narrowing was noted.  No chondrocalcinosis was noted. Impression: Unremarkable x-ray of the shoulder.  Xr Shoulder Right  Result Date: 12/06/2018 No glenohumeral or acromioclavicular joint space narrowing  was noted.  No chondrocalcinosis was noted. Impression: Unremarkable x-ray of the shoulder.   Recent Labs: Lab Results  Component Value Date   WBC 5.1 01/06/2011   HGB 11.7 (L) 01/06/2011   PLT 256 01/06/2011   NA 138 01/06/2011   K 3.1 (L) 01/06/2011   CL 101 01/06/2011   CO2 28 01/06/2011   GLUCOSE 179 (H) 01/06/2011   BUN 8 01/06/2011   CREATININE 0.38 (L) 01/06/2011   BILITOT 2.0 (H) 02/01/2007   ALKPHOS 125 02/01/2007   AST 14 02/01/2007   ALT 17 02/01/2007   PROT 9.3 (H) 02/01/2007   ALBUMIN 4.3 02/01/2007   CALCIUM 9.4 01/06/2011   GFRAA >90 01/06/2011    Speciality Comments: No specialty comments available.  Procedures:  No procedures performed Allergies: Fruit extracts and Zinc   Assessment / Plan:     Visit Diagnoses: Seronegative rheumatoid arthritis ([place]) - U/S+ syonvitis B wrists/left MCP, RF-, CCP-, 14-3-3 eta-: She has no obvious  synovitis on exam.  She has tenderness of multiple joints including bilateral wrist joints, right second MCP joint, and bilateral ankle joints.  She had an ultrasound of both hands and both wrist joints performed on 12/26/2018 which revealed synovitis in both wrist joints and and left MCP joints.  We discussed ultrasound results today.  We also discussed potential treatment options.  Indications, contraindications, potential side effects of Plaquenil were discussed.  All questions were addressed and consent was obtained today.  CBC and CMP will be drawn today and once labs have resulted we will send in a prescription for Plaquenil 200 mg 1 tablet by mouth twice daily.  She is aware that she needs to schedule a Plaquenil eye exam within 1 month of starting on Plaquenil and then continue receiving yearly eye exams.  She will return for routine lab work in 1 month and 3 months then every 5 months to monitor for drug toxicity.  She was advised to notify us if she cannot tolerate taking Plaquenil.  She will follow-up in 1 month.    Patient was counseled on the purpose, proper use, and adverse effects of hydroxychloroquine including nausea/diarrhea, skin rash, headaches, and sun sensitivity.  Discussed importance of annual eye exams while on hydroxychloroquine to monitor to ocular toxicity and discussed importance of frequent laboratory monitoring.  Provided patient with eye exam form for baseline ophthalmologic exam.  Provided patient with educational materials on hydroxychloroquine and answered all questions.  Patient consented to hydroxychloroquine.  Will upload consent in the media tab.    Dose will be Plaquenil 200 mg twice daily.  Prescription pending lab results.  High risk medication use -CBC and CMP will be drawn today.  If labs are stable, she will start taking Plaquenil 200 mg 1 tablet by mouth twice daily.  She will return for lab work in 1 month, 3 months, then every 5 months.  She was given a baseline  PLQ eye exam form. Plan: COMPLETE METABOLIC PANEL WITH GFR, CBC with Differential/Platelet  Chronic pain of both shoulders - XR unremarkable-She has good ROM with discomfort bilaterally.  No tenderness or effusion noted.    Pain in both hands - XR unremarkable, autoimmune workup negative, ultrasound of both hands revealed synovitis of both wrist joints.  She will starting on Plaquenil as described above.   Carpal tunnel syndrome, right upper limb - She had a nerve conduction velocity study performed at Dr. Jodene Nam office in the past.  She experiences intermittent paresthesias.  Patellofemoral disorder of  left knee - Followed by [facility]  Chronic SI joint pain: She experiences intermittent lower back pain and stiffness.    Spondylolysis, lumbar region: Chronic pain   Chronic pain syndrome: She takes hydrocodone for pain relief.   Other medical conditions are listed as follows:   History of depression  Type 1 diabetes mellitus with diabetic polyneuropathy (HCC)  Diabetic gastroparesis (HCC)  History of gastroesophageal reflux (GERD)  History of asthma  History of iron deficiency anemia  Vitamin D deficiency  Vitamin B12 deficiency   Orders: Orders Placed This Encounter  Procedures  . COMPLETE METABOLIC PANEL WITH GFR  . CBC with Differential/Platelet   No orders of the defined types were placed in this encounter.   Face-to-face time spent with patient was 30 minutes. Greater than 50% of time was spent in counseling and coordination of care.  Follow-Up Instructions: Return in about 4 weeks (around 01/30/2019) for Rheumatoid arthritis.   Gearldine Bienenstock, PA-C   I examined and evaluated the patient with Sherron Ales PA.  Patient has synovitis on the ultrasound examination recently.  She continues to have arthralgias.  After different treatment options and side effects were discussed we decided to proceed with Plaquenil.  She was in agreement.  We will see  response to Plaquenil .  The plan of care was discussed as noted above.  Pollyann Savoy, MD  Note - This record has been created using Animal nutritionist.  Chart creation errors have been sought, but may not always  have been located. Such creation errors do not reflect on  the standard of medical care.

## 2019-01-02 ENCOUNTER — Ambulatory Visit (INDEPENDENT_AMBULATORY_CARE_PROVIDER_SITE_OTHER): Payer: No Typology Code available for payment source | Admitting: Rheumatology

## 2019-01-02 ENCOUNTER — Telehealth: Payer: Self-pay | Admitting: Pharmacist

## 2019-01-02 ENCOUNTER — Other Ambulatory Visit: Payer: Self-pay

## 2019-01-02 ENCOUNTER — Encounter: Payer: Self-pay | Admitting: Rheumatology

## 2019-01-02 VITALS — BP 173/102 | HR 77 | Resp 14 | Ht 63.75 in | Wt 281.0 lb

## 2019-01-02 DIAGNOSIS — M0600 Rheumatoid arthritis without rheumatoid factor, unspecified site: Secondary | ICD-10-CM | POA: Diagnosis not present

## 2019-01-02 DIAGNOSIS — M79642 Pain in left hand: Secondary | ICD-10-CM

## 2019-01-02 DIAGNOSIS — M25511 Pain in right shoulder: Secondary | ICD-10-CM | POA: Diagnosis not present

## 2019-01-02 DIAGNOSIS — Z862 Personal history of diseases of the blood and blood-forming organs and certain disorders involving the immune mechanism: Secondary | ICD-10-CM

## 2019-01-02 DIAGNOSIS — M4306 Spondylolysis, lumbar region: Secondary | ICD-10-CM

## 2019-01-02 DIAGNOSIS — Z8719 Personal history of other diseases of the digestive system: Secondary | ICD-10-CM

## 2019-01-02 DIAGNOSIS — G5601 Carpal tunnel syndrome, right upper limb: Secondary | ICD-10-CM | POA: Diagnosis not present

## 2019-01-02 DIAGNOSIS — E1143 Type 2 diabetes mellitus with diabetic autonomic (poly)neuropathy: Secondary | ICD-10-CM

## 2019-01-02 DIAGNOSIS — E538 Deficiency of other specified B group vitamins: Secondary | ICD-10-CM

## 2019-01-02 DIAGNOSIS — M25512 Pain in left shoulder: Secondary | ICD-10-CM

## 2019-01-02 DIAGNOSIS — M79641 Pain in right hand: Secondary | ICD-10-CM | POA: Diagnosis not present

## 2019-01-02 DIAGNOSIS — E559 Vitamin D deficiency, unspecified: Secondary | ICD-10-CM

## 2019-01-02 DIAGNOSIS — G894 Chronic pain syndrome: Secondary | ICD-10-CM

## 2019-01-02 DIAGNOSIS — M222X2 Patellofemoral disorders, left knee: Secondary | ICD-10-CM

## 2019-01-02 DIAGNOSIS — E1042 Type 1 diabetes mellitus with diabetic polyneuropathy: Secondary | ICD-10-CM

## 2019-01-02 DIAGNOSIS — Z8659 Personal history of other mental and behavioral disorders: Secondary | ICD-10-CM

## 2019-01-02 DIAGNOSIS — Z79899 Other long term (current) drug therapy: Secondary | ICD-10-CM

## 2019-01-02 DIAGNOSIS — G8929 Other chronic pain: Secondary | ICD-10-CM

## 2019-01-02 DIAGNOSIS — Z8709 Personal history of other diseases of the respiratory system: Secondary | ICD-10-CM

## 2019-01-02 DIAGNOSIS — K3184 Gastroparesis: Secondary | ICD-10-CM

## 2019-01-02 DIAGNOSIS — M533 Sacrococcygeal disorders, not elsewhere classified: Secondary | ICD-10-CM

## 2019-01-02 LAB — CBC WITH DIFFERENTIAL/PLATELET
Absolute Monocytes: 560 cells/uL (ref 200–950)
Basophils Absolute: 42 cells/uL (ref 0–200)
Basophils Relative: 0.6 %
Eosinophils Absolute: 63 cells/uL (ref 15–500)
Eosinophils Relative: 0.9 %
HCT: 33.5 % — ABNORMAL LOW (ref 35.0–45.0)
Hemoglobin: 10.2 g/dL — ABNORMAL LOW (ref 11.7–15.5)
Lymphs Abs: 3080 cells/uL (ref 850–3900)
MCH: 24.6 pg — ABNORMAL LOW (ref 27.0–33.0)
MCHC: 30.4 g/dL — ABNORMAL LOW (ref 32.0–36.0)
MCV: 80.7 fL (ref 80.0–100.0)
MPV: 13.2 fL — ABNORMAL HIGH (ref 7.5–12.5)
Monocytes Relative: 8 %
Neutro Abs: 3255 cells/uL (ref 1500–7800)
Neutrophils Relative %: 46.5 %
Platelets: 231 10*3/uL (ref 140–400)
RBC: 4.15 10*6/uL (ref 3.80–5.10)
RDW: 13.3 % (ref 11.0–15.0)
Total Lymphocyte: 44 %
WBC: 7 10*3/uL (ref 3.8–10.8)

## 2019-01-02 LAB — COMPLETE METABOLIC PANEL WITH GFR
AG Ratio: 1.3 (calc) (ref 1.0–2.5)
ALT: 5 U/L — ABNORMAL LOW (ref 6–29)
AST: 8 U/L — ABNORMAL LOW (ref 10–30)
Albumin: 3.8 g/dL (ref 3.6–5.1)
Alkaline phosphatase (APISO): 50 U/L (ref 31–125)
BUN: 9 mg/dL (ref 7–25)
CO2: 27 mmol/L (ref 20–32)
Calcium: 8.6 mg/dL (ref 8.6–10.2)
Chloride: 105 mmol/L (ref 98–110)
Creat: 0.83 mg/dL (ref 0.50–1.10)
GFR, Est African American: 112 mL/min/{1.73_m2} (ref >=60)
GFR, Est Non African American: 97 mL/min/{1.73_m2} (ref >=60)
Globulin: 2.9 g/dL (calc) (ref 1.9–3.7)
Glucose, Bld: 167 mg/dL — ABNORMAL HIGH (ref 65–99)
Potassium: 4.5 mmol/L (ref 3.5–5.3)
Sodium: 141 mmol/L (ref 135–146)
Total Bilirubin: 0.3 mg/dL (ref 0.2–1.2)
Total Protein: 6.7 g/dL (ref 6.1–8.1)

## 2019-01-02 NOTE — Progress Notes (Signed)
Pharmacy Note  Subjective: Patient presents today to the [facility] to see Dr. Estanislado Pandy.  Patient seen by the pharmacist for counseling on hydroxychloroquine rheumatoid arthritis.  She is naive to therapy.  Objective: CMP     Component Value Date/Time   NA 138 01/06/2011 1827   K 3.1 (L) 01/06/2011 1827   CL 101 01/06/2011 1827   CO2 28 01/06/2011 1827   GLUCOSE 179 (H) 01/06/2011 1827   BUN 8 01/06/2011 1827   CREATININE 0.38 (L) 01/06/2011 1827   CALCIUM 9.4 01/06/2011 1827   PROT 9.3 (H) 02/01/2007 1145   ALBUMIN 4.3 02/01/2007 1145   AST 14 02/01/2007 1145   ALT 17 02/01/2007 1145   ALKPHOS 125 02/01/2007 1145   BILITOT 2.0 (H) 02/01/2007 1145   GFRNONAA >90 01/06/2011 1827   GFRAA >90 01/06/2011 1827    CBC    Component Value Date/Time   WBC 5.1 01/06/2011 1827   RBC 4.51 01/06/2011 1827   HGB 11.7 (L) 01/06/2011 1827   HCT 36.2 01/06/2011 1827   PLT 256 01/06/2011 1827   MCV 80.3 01/06/2011 1827   MCH 25.9 (L) 01/06/2011 1827   MCHC 32.3 01/06/2011 1827   RDW 12.5 01/06/2011 1827   LYMPHSABS 2.8 01/06/2011 1827   MONOABS 0.5 01/06/2011 1827   EOSABS 0.0 01/06/2011 1827   BASOSABS 0.0 01/06/2011 1827    Assessment/Plan: Patient was counseled on the purpose, proper use, and adverse effects of hydroxychloroquine including nausea/diarrhea, skin rash, headaches, and sun sensitivity.  Discussed importance of annual eye exams while on hydroxychloroquine to monitor to ocular toxicity and discussed importance of frequent laboratory monitoring.  Provided patient with eye exam form for baseline ophthalmologic exam and standing lab instructions.  Provided patient with educational materials on hydroxychloroquine and answered all questions.  Patient consented to hydroxychloroquine.  Will upload consent in the media tab.    Dose will be Plaquenil 200 mg twice daily based on weight of 127.5 kg and height 5\' 3" .  Prescription pending updated lab results.  All  questions encouraged and answered.  Instructed patient to call with any questions or concerns.   Mariella Saa, PharmD, [place], [place] Clinical Specialty Pharmacist [phone]  01/02/2019 3:27 PM

## 2019-01-02 NOTE — Telephone Encounter (Signed)
Dose will be Plaquenil 200 mg twice daily based on weight of 127.5 kg and height 5\' 3" .  Prescription pending updated lab results.

## 2019-01-03 MED ORDER — HYDROXYCHLOROQUINE SULFATE 200 MG PO TABS: 200.0000 mg | ORAL_TABLET | Freq: Two times a day (BID) | ORAL | 1 refills | Status: DC

## 2019-01-03 NOTE — Telephone Encounter (Signed)
Labs have resulted and patient advised. Prescription for PLQ sent to the pharmacy. Patient reminded to schedule PLQ eye exam.

## 2019-01-03 NOTE — Progress Notes (Signed)
Glucose is elevated-167.  Hgb and Hct are low. Please notify patient that she continues to be anemic.  We will recheck CBC in 1 month.  Please send in prescription for PLQ 200 mg 1 tablet BID.

## 2019-01-07 ENCOUNTER — Telehealth: Payer: Self-pay | Admitting: Rheumatology

## 2019-01-07 MED ORDER — HYDROXYCHLOROQUINE SULFATE 200 MG PO TABS: 200.0000 mg | ORAL_TABLET | Freq: Two times a day (BID) | ORAL | 1 refills | Status: DC

## 2019-01-07 NOTE — Telephone Encounter (Signed)
Patient called stating she spoke with the pharmacist at [facility] this morning who told her they "lost her prescription of Plaquenil" and to have the office resend the prescription.

## 2019-01-07 NOTE — Telephone Encounter (Signed)
Pharmacy confirmed they lost the prescription. Resent the prescription the pharmacy.

## 2019-01-18 LAB — HM DIABETES EYE EXAM

## 2019-01-22 NOTE — Progress Notes (Signed)
Office Visit Note  Patient: [name]             Date of Birth: 04-20-1990           MRN: [id number]             PCP: Jonathon Jordan, MD Referring: Jonathon Jordan, MD Visit Date: 02/05/2019 Occupation: @GUAROCC @  Subjective:  Pain in multiple joints.   History of Present Illness: [name] is a 28 y.o. female with history of seronegative rheumatoid arthritis.  She states she has been on Plaquenil for a month now.  She continues to have pain and discomfort in her left shoulder, right wrist and bilateral ankle joints.  She states her right wrist, right hand and bilateral ankle joints swell.  She has not noticed any improvement on Plaquenil so far.  Activities of Daily Living:  Patient reports morning stiffness for 1 hour.   Patient Reports nocturnal pain.  Difficulty dressing/grooming: Denies Difficulty climbing stairs: Denies Difficulty getting out of chair: Denies Difficulty using hands for taps, buttons, cutlery, and/or writing: Reports  Review of Systems  Constitutional: Positive for fatigue. Negative for night sweats, weight gain and weight loss.  HENT: Negative for mouth sores, trouble swallowing, trouble swallowing, mouth dryness and nose dryness.   Eyes: Negative for pain, redness, itching, visual disturbance and dryness.  Respiratory: Negative for cough, shortness of breath, wheezing and difficulty breathing.   Cardiovascular: Negative for chest pain, palpitations, hypertension, irregular heartbeat and swelling in legs/feet.  Gastrointestinal: Negative for blood in stool, constipation and diarrhea.  Endocrine: Negative for increased urination.  Genitourinary: Negative for difficulty urinating, painful urination and vaginal dryness.  Musculoskeletal: Positive for arthralgias, joint pain, joint swelling and morning stiffness. Negative for myalgias, muscle weakness, muscle tenderness and myalgias.  Skin: Negative for color change, rash, hair loss, skin tightness,  ulcers and sensitivity to sunlight.  Allergic/Immunologic: Negative for susceptible to infections.  Neurological: Positive for headaches. Negative for dizziness, light-headedness, numbness, memory loss, night sweats and weakness.  Hematological: Negative for bruising/bleeding tendency and swollen glands.  Psychiatric/Behavioral: Negative for depressed mood, confusion and sleep disturbance. The patient is nervous/anxious.     PMFS History:  Patient Active Problem List   Diagnosis Date Noted  . Chronic low back pain 11/22/2013  . Clinical depression 10/07/2013  . Extreme obesity 10/07/2013  . Diabetic neuropathy ([place]) 08/06/2013  . General patient noncompliance 12/21/2012  . 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 Appetite disorder 12/30/2010  . BP (high blood pressure) 12/30/2010  . Muscle ache 12/30/2010  . Cachectic ([place]) 12/30/2010  . 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 Diabetes mellitus 09/03/2010    Past Medical History:  Diagnosis Date  . Anemia   . Arthritis   . Asthma   . Diabetes mellitus without complication ([place])   . Hypertension   . Vitamin D deficiency     Family History  Problem Relation Age of Onset  . Lupus Maternal Grandmother   . Asthma Sister   . ADD / ADHD Sister    Past Surgical History:  Procedure Laterality Date  . GASTRIC BYPASS  07/2017   Social History   Social History Narrative  . Not on file    There is no immunization history on file for this patient.   Objective: Vital Signs: BP (!) 154/92 (BP Location: Left Wrist, Patient Position: Sitting, Cuff Size: Normal)   Pulse 85   Resp 15   Ht 5' 3.75" (1.619 m)  Wt 279 lb (126.6 kg)   BMI 48.27 kg/m    Physical Exam Vitals signs and nursing note reviewed.  Constitutional:      Appearance: She is well-developed.  HENT:     Head: Normocephalic and atraumatic.  Eyes:     Conjunctiva/sclera: Conjunctivae normal.  Neck:     Musculoskeletal: Normal range of motion.   Cardiovascular:     Rate and Rhythm: Normal rate and regular rhythm.     Heart sounds: Normal heart sounds.  Pulmonary:     Effort: Pulmonary effort is normal.     Breath sounds: Normal breath sounds.  Abdominal:     General: Bowel sounds are normal.     Palpations: Abdomen is soft.  Lymphadenopathy:     Cervical: No cervical adenopathy.  Skin:    General: Skin is warm and dry.     Capillary Refill: Capillary refill takes less than 2 seconds.  Neurological:     Mental Status: She is alert and oriented to person, place, and time.  Psychiatric:        Behavior: Behavior normal.      Musculoskeletal Exam: C-spine was in good range of motion.  Lumbar spine was in good range of motion.  She complains of discomfort in her lumbar spine with range of motion.  Shoulder joints were in good range of motion.  She complains of discomfort in the bilateral shoulder joints.  Elbow joints wrist joints MCPs PIPs DIPs with good range of motion with no synovitis.  She has some tenderness over right wrist joint and some of her MCP joints as described below.  Hip joints, knee joints, ankle joints, MTPs and PIPs with good range of motion with no synovitis.  She had tenderness on palpation about her ankle joints.  CDAI Exam: CDAI Score: 3  Patient Global: 7 mm; Provider Global: 3 mm Swollen: 0 ; Tender: 4  Joint Exam      Right  Left  Wrist   Tender     MCP 2   Tender     Ankle   Tender   Tender     Investigation: No additional findings.  Imaging: No results found.  Recent Labs: Lab Results  Component Value Date   WBC 7.0 01/02/2019   HGB 10.2 (L) 01/02/2019   PLT 231 01/02/2019   NA 141 01/02/2019   K 4.5 01/02/2019   CL 105 01/02/2019   CO2 27 01/02/2019   GLUCOSE 167 (H) 01/02/2019   BUN 9 01/02/2019   CREATININE 0.83 01/02/2019   BILITOT 0.3 01/02/2019   ALKPHOS 125 02/01/2007   AST 8 (L) 01/02/2019   ALT 5 (L) 01/02/2019   PROT 6.7 01/02/2019   ALBUMIN 4.3 02/01/2007    CALCIUM 8.6 01/02/2019   GFRAA 112 01/02/2019    Speciality Comments: No specialty comments available.  Procedures:  No procedures performed Allergies: Fruit extracts and Zinc   Assessment / Plan:     Visit Diagnoses: Seronegative rheumatoid arthritis (HCC) -  U/S+ syonvitis B wrists/left MCP, RF-, CCP-, 14-3-3 eta-: She continues to have some discomfort in her joints.  She had no synovitis on examination today.  She has not noticed any improvement on Plaquenil so far.  She has been taking Plaquenil for a month.  I have advised her to monitor for the next 2 to 3 months to see how she improves on Plaquenil.  High risk medication use -patient states that she had baseline eye examination on January 18, 2019.  Plan: CBC with diff, CMP with GFR today and then every 3 months.  Pain in both hands-she has ongoing discomfort but no synovitis was noted today.  Carpal tunnel syndrome, right upper limb-she has been using a brace.  Patellofemoral disorder of left knee-chronic pain.  Chronic SI joint pain-chronic pain.  Spondylolysis, lumbar region-she had good range of motion.  Chronic pain syndrome-she continues to have some generalized pain.  This is difficult to see improvement in her joint symptoms due to ongoing pain and discomfort.  BMI 48.27 - Weight loss diet and exercise was discussed.  History of depression  Type 1 diabetes mellitus with diabetic polyneuropathy (HCC)  Diabetic gastroparesis (HCC)  History of gastroesophageal reflux (GERD)  History of asthma  History of iron deficiency anemia  Vitamin D deficiency  Vitamin B12 deficiency  Orders: Orders Placed This Encounter  Procedures  . CBC with diff  . CMP with GFR   No orders of the defined types were placed in this encounter.     Follow-Up Instructions: Return in about 3 months (around 05/08/2019) for Rheumatoid arthritis.   Pollyann SavoyShaili Sky Borboa, MD  Note - This record has been created using Animal [employer]Dragon software.   Chart creation errors have been sought, but may not always  have been located. Such creation errors do not reflect on  the standard of medical care.

## 2019-01-30 ENCOUNTER — Ambulatory Visit: Payer: 59 | Admitting: Physician Assistant

## 2019-02-05 ENCOUNTER — Ambulatory Visit (INDEPENDENT_AMBULATORY_CARE_PROVIDER_SITE_OTHER): Payer: No Typology Code available for payment source | Admitting: Rheumatology

## 2019-02-05 ENCOUNTER — Encounter: Payer: Self-pay | Admitting: Physician Assistant

## 2019-02-05 ENCOUNTER — Other Ambulatory Visit: Payer: Self-pay

## 2019-02-05 VITALS — BP 154/92 | HR 85 | Resp 15 | Ht 63.75 in | Wt 279.0 lb

## 2019-02-05 DIAGNOSIS — M0600 Rheumatoid arthritis without rheumatoid factor, unspecified site: Secondary | ICD-10-CM | POA: Diagnosis not present

## 2019-02-05 DIAGNOSIS — G8929 Other chronic pain: Secondary | ICD-10-CM

## 2019-02-05 DIAGNOSIS — M4306 Spondylolysis, lumbar region: Secondary | ICD-10-CM

## 2019-02-05 DIAGNOSIS — Z8709 Personal history of other diseases of the respiratory system: Secondary | ICD-10-CM

## 2019-02-05 DIAGNOSIS — Z6841 Body Mass Index (BMI) 40.0 and over, adult: Secondary | ICD-10-CM

## 2019-02-05 DIAGNOSIS — Z8659 Personal history of other mental and behavioral disorders: Secondary | ICD-10-CM

## 2019-02-05 DIAGNOSIS — Z862 Personal history of diseases of the blood and blood-forming organs and certain disorders involving the immune mechanism: Secondary | ICD-10-CM

## 2019-02-05 DIAGNOSIS — E559 Vitamin D deficiency, unspecified: Secondary | ICD-10-CM

## 2019-02-05 DIAGNOSIS — M222X2 Patellofemoral disorders, left knee: Secondary | ICD-10-CM

## 2019-02-05 DIAGNOSIS — Z8719 Personal history of other diseases of the digestive system: Secondary | ICD-10-CM

## 2019-02-05 DIAGNOSIS — Z79899 Other long term (current) drug therapy: Secondary | ICD-10-CM | POA: Diagnosis not present

## 2019-02-05 DIAGNOSIS — M79641 Pain in right hand: Secondary | ICD-10-CM | POA: Diagnosis not present

## 2019-02-05 DIAGNOSIS — K3184 Gastroparesis: Secondary | ICD-10-CM

## 2019-02-05 DIAGNOSIS — E538 Deficiency of other specified B group vitamins: Secondary | ICD-10-CM

## 2019-02-05 DIAGNOSIS — G5601 Carpal tunnel syndrome, right upper limb: Secondary | ICD-10-CM | POA: Diagnosis not present

## 2019-02-05 DIAGNOSIS — E1143 Type 2 diabetes mellitus with diabetic autonomic (poly)neuropathy: Secondary | ICD-10-CM

## 2019-02-05 DIAGNOSIS — M79642 Pain in left hand: Secondary | ICD-10-CM

## 2019-02-05 DIAGNOSIS — M533 Sacrococcygeal disorders, not elsewhere classified: Secondary | ICD-10-CM

## 2019-02-05 DIAGNOSIS — E1042 Type 1 diabetes mellitus with diabetic polyneuropathy: Secondary | ICD-10-CM

## 2019-02-05 DIAGNOSIS — G894 Chronic pain syndrome: Secondary | ICD-10-CM

## 2019-02-05 NOTE — Patient Instructions (Addendum)
Standing Labs We placed an order today for your standing lab work.    Please come back and get your standing labs in February and every 5 months  We have open lab daily Monday through Thursday from 8:30-12:30 PM and 1:30-4:30 PM and Friday from 8:30-12:30 PM and 1:30-4:00 PM at the office of Dr. Bo Merino.   You may experience shorter wait times on Monday and Friday afternoons. The office is located at [address] [place], [place], [place] [address] No appointment is necessary.   Labs are drawn by [facility].  You may receive a bill from [place] for your lab work.  If you wish to have your labs drawn at another location, please call the office 24 hours in advance to send orders.  If you have any questions regarding directions or hours of operation,  please call [phone].   Just as a reminder please drink plenty of water prior to coming for your lab work. Thanks!       Back Exercises The following exercises strengthen the muscles that help to support the trunk and back. They also help to keep the lower back flexible. Doing these exercises can help to prevent back pain or lessen existing pain.  If you have back pain or discomfort, try doing these exercises 2-3 times each day or as told by your health care provider.  As your pain improves, do them once each day, but increase the number of times that you repeat the steps for each exercise (do more repetitions).  To prevent the recurrence of back pain, continue to do these exercises once each day or as told by your health care provider. Do exercises exactly as told by your health care provider and adjust them as directed. It is normal to feel mild stretching, pulling, tightness, or discomfort as you do these exercises, but you should stop right away if you feel sudden pain or your pain gets worse. Exercises Single knee to chest Repeat these steps 3-5 times for each leg: 1. Lie on your back on a firm bed or the floor with  your legs extended. 2. Bring one knee to your chest. Your other leg should stay extended and in contact with the floor. 3. Hold your knee in place by grabbing your knee or thigh with both hands and hold. 4. Pull on your knee until you feel a gentle stretch in your lower back or buttocks. 5. Hold the stretch for 10-30 seconds. 6. Slowly release and straighten your leg. Pelvic tilt Repeat these steps 5-10 times: 1. Lie on your back on a firm bed or the floor with your legs extended. 2. Bend your knees so they are pointing toward the ceiling and your feet are flat on the floor. 3. Tighten your lower abdominal muscles to press your lower back against the floor. This motion will tilt your pelvis so your tailbone points up toward the ceiling instead of pointing to your feet or the floor. 4. With gentle tension and even breathing, hold this position for 5-10 seconds. Cat-cow Repeat these steps until your lower back becomes more flexible: 1. Get into a hands-and-knees position on a firm surface. Keep your hands under your shoulders, and keep your knees under your hips. You may place padding under your knees for comfort. 2. Let your head hang down toward your chest. Contract your abdominal muscles and point your tailbone toward the floor so your lower back becomes rounded like the back of a cat. 3. Hold this position  for 5 seconds. 4. Slowly lift your head, let your abdominal muscles relax and point your tailbone up toward the ceiling so your back forms a sagging arch like the back of a cow. 5. Hold this position for 5 seconds.  Press-ups Repeat these steps 5-10 times: 1. Lie on your abdomen (face-down) on the floor. 2. Place your palms near your head, about shoulder-width apart. 3. Keeping your back as relaxed as possible and keeping your hips on the floor, slowly straighten your arms to raise the top half of your body and lift your shoulders. Do not use your back muscles to raise your upper torso.  You may adjust the placement of your hands to make yourself more comfortable. 4. Hold this position for 5 seconds while you keep your back relaxed. 5. Slowly return to lying flat on the floor.  Bridges Repeat these steps 10 times: 1. Lie on your back on a firm surface. 2. Bend your knees so they are pointing toward the ceiling and your feet are flat on the floor. Your arms should be flat at your sides, next to your body. 3. Tighten your buttocks muscles and lift your buttocks off the floor until your waist is at almost the same height as your knees. You should feel the muscles working in your buttocks and the back of your thighs. If you do not feel these muscles, slide your feet 1-2 inches farther away from your buttocks. 4. Hold this position for 3-5 seconds. 5. Slowly lower your hips to the starting position, and allow your buttocks muscles to relax completely. If this exercise is too easy, try doing it with your arms crossed over your chest. Abdominal crunches Repeat these steps 5-10 times: 1. Lie on your back on a firm bed or the floor with your legs extended. 2. Bend your knees so they are pointing toward the ceiling and your feet are flat on the floor. 3. Cross your arms over your chest. 4. Tip your chin slightly toward your chest without bending your neck. 5. Tighten your abdominal muscles and slowly raise your trunk (torso) high enough to lift your shoulder blades a tiny bit off the floor. Avoid raising your torso higher than that because it can put too much stress on your low back and does not help to strengthen your abdominal muscles. 6. Slowly return to your starting position. Back lifts Repeat these steps 5-10 times: 1. Lie on your abdomen (face-down) with your arms at your sides, and rest your forehead on the floor. 2. Tighten the muscles in your legs and your buttocks. 3. Slowly lift your chest off the floor while you keep your hips pressed to the floor. Keep the back of your  head in line with the curve in your back. Your eyes should be looking at the floor. 4. Hold this position for 3-5 seconds. 5. Slowly return to your starting position. Contact a health care provider if:  Your back pain or discomfort gets much worse when you do an exercise.  Your worsening back pain or discomfort does not lessen within 2 hours after you exercise. If you have any of these problems, stop doing these exercises right away. Do not do them again unless your health care provider says that you can. Get help right away if:  You develop sudden, severe back pain. If this happens, stop doing the exercises right away. Do not do them again unless your health care provider says that you can. This information is not intended  to replace advice given to you by your health care provider. Make sure you discuss any questions you have with your health care provider. Document Released: 04/14/2004 Document Revised: 07/12/2018 Document Reviewed: 12/07/2017 Elsevier Patient Education  2020 [employer].

## 2019-02-06 LAB — CBC WITH DIFFERENTIAL/PLATELET
Absolute Monocytes: 332 cells/uL (ref 200–950)
Basophils Absolute: 41 cells/uL (ref 0–200)
Basophils Relative: 0.8 %
Eosinophils Absolute: 51 cells/uL (ref 15–500)
Eosinophils Relative: 1 %
HCT: 32.4 % — ABNORMAL LOW (ref 35.0–45.0)
Hemoglobin: 10.2 g/dL — ABNORMAL LOW (ref 11.7–15.5)
Lymphs Abs: 2565 cells/uL (ref 850–3900)
MCH: 25.1 pg — ABNORMAL LOW (ref 27.0–33.0)
MCHC: 31.5 g/dL — ABNORMAL LOW (ref 32.0–36.0)
MCV: 79.8 fL — ABNORMAL LOW (ref 80.0–100.0)
MPV: 12.6 fL — ABNORMAL HIGH (ref 7.5–12.5)
Monocytes Relative: 6.5 %
Neutro Abs: 2111 cells/uL (ref 1500–7800)
Neutrophils Relative %: 41.4 %
Platelets: 220 10*3/uL (ref 140–400)
RBC: 4.06 10*6/uL (ref 3.80–5.10)
RDW: 13.1 % (ref 11.0–15.0)
Total Lymphocyte: 50.3 %
WBC: 5.1 10*3/uL (ref 3.8–10.8)

## 2019-02-06 LAB — COMPLETE METABOLIC PANEL WITH GFR
AG Ratio: 1.4 (calc) (ref 1.0–2.5)
ALT: 8 U/L (ref 6–29)
AST: 11 U/L (ref 10–30)
Albumin: 3.7 g/dL (ref 3.6–5.1)
Alkaline phosphatase (APISO): 54 U/L (ref 31–125)
BUN: 8 mg/dL (ref 7–25)
CO2: 25 mmol/L (ref 20–32)
Calcium: 8.5 mg/dL — ABNORMAL LOW (ref 8.6–10.2)
Chloride: 107 mmol/L (ref 98–110)
Creat: 0.95 mg/dL (ref 0.50–1.10)
GFR, Est African American: 95 mL/min/{1.73_m2} (ref >=60)
GFR, Est Non African American: 82 mL/min/{1.73_m2} (ref >=60)
Globulin: 2.7 g/dL (calc) (ref 1.9–3.7)
Glucose, Bld: 77 mg/dL (ref 65–99)
Potassium: 4.4 mmol/L (ref 3.5–5.3)
Sodium: 139 mmol/L (ref 135–146)
Total Bilirubin: 0.6 mg/dL (ref 0.2–1.2)
Total Protein: 6.4 g/dL (ref 6.1–8.1)

## 2019-02-06 NOTE — Progress Notes (Signed)
Anemia persists, rest of the labs are stable. Please forward labs to her PCP.

## 2019-03-05 ENCOUNTER — Telehealth: Payer: Self-pay | Admitting: Rheumatology

## 2019-03-05 ENCOUNTER — Other Ambulatory Visit: Payer: Self-pay | Admitting: Rheumatology

## 2019-03-05 DIAGNOSIS — M0600 Rheumatoid arthritis without rheumatoid factor, unspecified site: Secondary | ICD-10-CM

## 2019-03-05 NOTE — Telephone Encounter (Signed)
Noted. Will call patient once received.

## 2019-03-05 NOTE — Telephone Encounter (Signed)
Patient left a message stating she requested for her eye exam report to be faxed here. Patient request a call back to let her know if we received it.

## 2019-03-05 NOTE — Telephone Encounter (Signed)
Last Visit: 02/05/2019 Next Visit: 05/09/2019 Labs: 02/05/2019 Anemia persists, rest of the labs are stable.  Eye exam: no baseline eye exam on file. 01/18/2019 per patient.   Advised patient we need eye exam, patient verbalized understanding and will get copy to drop off to the office.   Okay to refill 30 day supply, per Dr. Estanislado Pandy.

## 2019-03-08 NOTE — Telephone Encounter (Signed)
Attempted to contact the patient and left message on machine to advise patient we have received eye exam. I have sent our PLQ eye exam form to the eye doctor to complete (due to difficulty reading the doctors handwriting on the exam form).

## 2019-04-01 ENCOUNTER — Other Ambulatory Visit: Payer: Self-pay | Admitting: Rheumatology

## 2019-04-01 DIAGNOSIS — M0600 Rheumatoid arthritis without rheumatoid factor, unspecified site: Secondary | ICD-10-CM

## 2019-04-01 NOTE — Telephone Encounter (Signed)
Last Visit: 02/05/2019 Next Visit: 05/09/2019 Labs: 02/05/2019 Anemia persists, rest of the labs are stable.  Eye exam: no baseline eye exam on file. 01/18/2019 per patient.   Patient advised that we do need a legible copy of her PLQ eye exam on file. Patient advised she will need to bring a copy of that to the office. Patient states she will get copy and bring to the office when her schedule permits as she is working.   Okay to refill 30 day supply PLQ?

## 2019-04-01 NOTE — Telephone Encounter (Signed)
Okay to give 30-day supply of Plaquenil.  She can mail the copy of the Plaquenil eye examination or fax it to [name].  Please ask her the name of the ophthalmologist so that they can obtain copies from there as well.

## 2019-04-03 ENCOUNTER — Telehealth: Payer: Self-pay | Admitting: Rheumatology

## 2019-04-03 NOTE — Telephone Encounter (Signed)
Jasmine December from [facility] called stating they received a call from the patient requesting her Plaquenil eye exam results be sent to Dr. Corliss Skains.  Jasmine December states patient received a voicemail stating "if the Plaquenil eye exam form was not received she would be discharged from the practice."  Jasmine December states she faxed Dr. Marnee Spring report 3 times.  Jasmine December states she will refax again this morning.

## 2019-04-03 NOTE — Telephone Encounter (Signed)
Plaquenil eye exam received, it has been documented in patient's chart. I called patient to discuss and patient states it was a miscommunication between her friend that works at [facility] and her co-worker. Patient states she advised her friend that we would not keep refilling the medication without the eye exam. Patient was very nice and apologized for the miscommunication at [facility].

## 2019-04-28 ENCOUNTER — Other Ambulatory Visit: Payer: Self-pay | Admitting: Rheumatology

## 2019-04-28 DIAGNOSIS — M0600 Rheumatoid arthritis without rheumatoid factor, unspecified site: Secondary | ICD-10-CM

## 2019-04-29 NOTE — Telephone Encounter (Signed)
Last Visit: 02/05/2019 Next Visit: 05/09/2019 Labs: 02/05/2019 Anemia persists, rest of the labs are stable.  Eye exam: 01/18/2019   Okay to refill per Dr. Corliss Skains.

## 2019-05-03 NOTE — Progress Notes (Deleted)
Office Visit Note  Patient: [name]             Date of Birth: May 12, 1990           MRN: [id number]             PCP: [name], MD Referring: [name], MD Visit Date: 05/09/2019 Occupation: @GUAROCC @  Subjective:  No chief complaint on file.   History of Present Illness: [name] is a 29 y.o. female ***   Activities of Daily Living:  Patient reports morning stiffness for *** {minute/hour:19697}.   Patient {ACTIONS;DENIES/REPORTS:21021675::"Denies"} nocturnal pain.  Difficulty dressing/grooming: {ACTIONS;DENIES/REPORTS:21021675::"Denies"} Difficulty climbing stairs: {ACTIONS;DENIES/REPORTS:21021675::"Denies"} Difficulty getting out of chair: {ACTIONS;DENIES/REPORTS:21021675::"Denies"} Difficulty using hands for taps, buttons, cutlery, and/or writing: {ACTIONS;DENIES/REPORTS:21021675::"Denies"}  No Rheumatology ROS completed.   PMFS History:  Patient Active Problem List   Diagnosis Date Noted  . Chronic low back pain 11/22/2013  . Clinical depression 10/07/2013  . Extreme obesity 10/07/2013  . Diabetic neuropathy (HCC) 08/06/2013  . General patient noncompliance 12/21/2012  . 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 Appetite disorder 12/30/2010  . BP (high blood pressure) 12/30/2010  . Muscle ache 12/30/2010  . Cachectic (HCC) 12/30/2010  . 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 Diabetes mellitus 09/03/2010    Past Medical History:  Diagnosis Date  . Anemia   . Arthritis   . Asthma   . Diabetes mellitus without complication (HCC)   . Hypertension   . Vitamin D deficiency     Family History  Problem Relation Age of Onset  . Lupus Maternal Grandmother   . Asthma Sister   . ADD / ADHD Sister    Past Surgical History:  Procedure Laterality Date  . GASTRIC BYPASS  07/2017   Social History   Social History Narrative  . Not on file    There is no immunization history on file for this patient.   Objective: Vital Signs:  There were no vitals taken for this visit.   Physical Exam   Musculoskeletal Exam: ***  CDAI Exam: CDAI Score: -- Patient Global: --; Provider Global: -- Swollen: --; Tender: -- Joint Exam 05/09/2019   No joint exam has been documented for this visit   There is currently no information documented on the homunculus. Go to the Rheumatology activity and complete the homunculus joint exam.  Investigation: No additional findings.  Imaging: No results found.  Recent Labs: Lab Results  Component Value Date   WBC 5.1 02/05/2019   HGB 10.2 (L) 02/05/2019   PLT 220 02/05/2019   NA 139 02/05/2019   K 4.4 02/05/2019   CL 107 02/05/2019   CO2 25 02/05/2019   GLUCOSE 77 02/05/2019   BUN 8 02/05/2019   CREATININE 0.95 02/05/2019   BILITOT 0.6 02/05/2019   ALKPHOS 125 02/01/2007   AST 11 02/05/2019   ALT 8 02/05/2019   PROT 6.4 02/05/2019   ALBUMIN 4.3 02/01/2007   CALCIUM 8.5 (L) 02/05/2019   GFRAA 95 02/05/2019    Speciality Comments: PLQ eye exam: 01/18/2019 normal @ [facility]. Follow up in 1 year.  Procedures:  No procedures performed Allergies: Fruit extracts and Zinc   Assessment / Plan:     Visit Diagnoses: No diagnosis found.  Orders: No orders of the defined types were placed in this encounter.  No orders of the defined types were placed in this encounter.   Face-to-face time spent with patient was *** minutes. Greater than 50% of time was spent in  counseling and coordination of care.  Follow-Up Instructions: No follow-ups on file.   Ofilia Neas, PA-C  Note - This record has been created using Dragon software.  Chart creation errors have been sought, but may not always  have been located. Such creation errors do not reflect on  the standard of medical care.

## 2019-05-09 ENCOUNTER — Other Ambulatory Visit: Payer: Self-pay | Admitting: Pain Medicine

## 2019-05-09 ENCOUNTER — Ambulatory Visit: Payer: 59 | Admitting: Physician Assistant

## 2019-05-09 DIAGNOSIS — M25512 Pain in left shoulder: Secondary | ICD-10-CM

## 2019-05-13 ENCOUNTER — Other Ambulatory Visit: Payer: Self-pay

## 2019-05-13 ENCOUNTER — Ambulatory Visit
Admission: RE | Admit: 2019-05-13 | Discharge: 2019-05-13 | Disposition: A | Discharge status: Home / Self Care | Payer: 59 | Source: Ambulatory Visit | Attending: Pain Medicine | Admitting: Pain Medicine

## 2019-05-13 DIAGNOSIS — M25512 Pain in left shoulder: Secondary | ICD-10-CM

## 2019-06-03 NOTE — Progress Notes (Signed)
Office Visit Note  Patient: [name]             Date of Birth: Jan 12, 1991           MRN: [id number]             PCP: [name], MD Referring: [name], MD Visit Date: 06/11/2019 Occupation: @GUAROCC @  Subjective:  Medication monitoring   History of Present Illness: [name] is a 29 y.o. female with history of seronegative rheumatoid arthritis and chronic pain syndrome.  She is taking plaquenil 200 mg 1 tablet by mouth twice daily.  She states she recently held [facility] for 1 month due to going through a depressive episode and not wanting to take her medications. She reports while off of PLQ she was having increased joint pain and joint swelling.  She states she recently had a left shoulder cortisone injection and right carpal tunnel cortisone injection by her pain management specialist, Dr. [facility]. She resumed PLQ 1 week ago and her discomfort has resolved. She is concerned about her family history of lupus and would like to recheck autoimmune lab work today.    Activities of Daily Living:  Patient reports morning stiffness for 60-90 minutes.   Patient Reports nocturnal pain.  Difficulty dressing/grooming: Denies Difficulty climbing stairs: Denies Difficulty getting out of chair: Denies Difficulty using hands for taps, buttons, cutlery, and/or writing: Reports  Review of Systems  Constitutional: Positive for fatigue.  HENT: Positive for mouth sores. Negative for mouth dryness and nose dryness.   Eyes: Negative for pain, itching, visual disturbance and dryness.  Respiratory: Negative for cough, hemoptysis, shortness of breath, wheezing and difficulty breathing.   Cardiovascular: Negative for chest pain, palpitations, hypertension and swelling in legs/feet.  Gastrointestinal: Negative for blood in stool, constipation and diarrhea.  Endocrine: Negative for increased urination.  Genitourinary: Negative for difficulty urinating and painful urination.    Musculoskeletal: Positive for arthralgias, joint pain, joint swelling and morning stiffness. Negative for myalgias, muscle weakness, muscle tenderness and myalgias.  Skin: Negative for color change, pallor, rash, hair loss, nodules/bumps, redness, skin tightness, ulcers and sensitivity to sunlight.  Allergic/Immunologic: Negative for susceptible to infections.  Neurological: Positive for headaches. Negative for dizziness and memory loss.  Hematological: Positive for bruising/bleeding tendency. Negative for swollen glands.  Psychiatric/Behavioral: Positive for depressed mood. Negative for confusion and sleep disturbance. The patient is nervous/anxious.     PMFS History:  Patient Active Problem List   Diagnosis Date Noted  . Chronic low back pain 11/22/2013  . Clinical depression 10/07/2013  . Extreme obesity 10/07/2013  . Diabetic neuropathy (HCC) 08/06/2013  . General patient noncompliance 12/21/2012  . 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 Appetite disorder 12/30/2010  . BP (high blood pressure) 12/30/2010  . Muscle ache 12/30/2010  . Cachectic (HCC) 12/30/2010  . 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 Diabetes mellitus 09/03/2010    Past Medical History:  Diagnosis Date  . Anemia   . Arthritis   . Asthma   . Diabetes mellitus without complication (HCC)   . Hypertension   . Vitamin D deficiency     Family History  Problem Relation Age of Onset  . Lupus Maternal Grandmother   . Asthma Sister   . ADD / ADHD Sister    Past Surgical History:  Procedure Laterality Date  . GASTRIC BYPASS  07/2017   Social History   Social History Narrative  . Not on file    There is no  immunization history on file for this patient.   Objective: Vital Signs: BP (!) 146/99 (BP Location: Left Wrist, Patient Position: Sitting, Cuff Size: Normal)   Pulse 91   Resp 16   Ht 5\' 4"  (1.626 m)   Wt 273 lb 12.8 oz (124.2 kg)   BMI 47.00 kg/m    Physical Exam Vitals and nursing  note reviewed.  Constitutional:      Appearance: She is well-developed.  HENT:     Head: Normocephalic and atraumatic.  Eyes:     Conjunctiva/sclera: Conjunctivae normal.  Pulmonary:     Effort: Pulmonary effort is normal.  Abdominal:     General: Bowel sounds are normal.     Palpations: Abdomen is soft.  Musculoskeletal:     Cervical back: Normal range of motion.  Lymphadenopathy:     Cervical: No cervical adenopathy.  Skin:    General: Skin is warm and dry.     Capillary Refill: Capillary refill takes less than 2 seconds.  Neurological:     Mental Status: She is alert and oriented to person, place, and time.  Psychiatric:        Behavior: Behavior normal.      Musculoskeletal Exam: C-spine, thoracic spine, spine good range of motion.  Shoulder joints, elbow joints, wrist joints, MCPs, PIPs and DIPs good range of motion no synovitis.  She has complete fist formation bilaterally.  Hip joints, knee joints and ankle joints MTPs, PIPs and DIPs good range of motion no synovitis.  No warmth or effusion of bilateral knee joints.  No tenderness or swelling of ankle joints.  CDAI Exam: CDAI Score: -- Patient Global: --; Provider Global: -- Swollen: --; Tender: -- Joint Exam 06/11/2019   No joint exam has been documented for this visit   There is currently no information documented on the homunculus. Go to the Rheumatology activity and complete the homunculus joint exam.  Investigation: No additional findings.  Imaging: MR SHOULDER LEFT WO CONTRAST  Result Date: 05/14/2019 CLINICAL DATA:  Diffuse left shoulder pain and limited range of motion for the past year. No known injury or prior surgery. EXAM: MRI OF THE LEFT SHOULDER WITHOUT CONTRAST TECHNIQUE: Multiplanar, multisequence MR imaging of the shoulder was performed. No intravenous contrast was administered. COMPARISON:  Left shoulder x-rays dated December 06, 2018. FINDINGS: Rotator cuff: Bursal surface fraying of the anterior  supraspinatus tendon at the insertion. The infraspinatus, teres minor, and subscapularis tendons are unremarkable. Muscles: No atrophy or abnormal signal of the muscles of the rotator cuff. Biceps long head:  Intact and normally positioned. Acromioclavicular Joint: Normal acromioclavicular joint. Type II acromion. No subacromial/subdeltoid bursal fluid. Glenohumeral Joint: No joint effusion. No chondral defect. Labrum: Grossly intact, but evaluation is limited by lack of intraarticular fluid. Bones:  No marrow abnormality, fracture or dislocation. Other: Loss of the normal fat within the rotator interval. IMPRESSION: 1. Bursal surface fraying of the anterior supraspinatus tendon at the insertion. No high-grade rotator cuff tear. 2. Loss of the normal fat within the rotator interval, which can be seen with adhesive capsulitis. Electronically Signed   By: Titus Dubin M.D.   On: 05/14/2019 09:39    Recent Labs: Lab Results  Component Value Date   WBC 5.1 02/05/2019   HGB 10.2 (L) 02/05/2019   PLT 220 02/05/2019   NA 139 02/05/2019   K 4.4 02/05/2019   CL 107 02/05/2019   CO2 25 02/05/2019   GLUCOSE 77 02/05/2019   BUN 8 02/05/2019  CREATININE 0.95 02/05/2019   BILITOT 0.6 02/05/2019   ALKPHOS 125 02/01/2007   AST 11 02/05/2019   ALT 8 02/05/2019   PROT 6.4 02/05/2019   ALBUMIN 4.3 02/01/2007   CALCIUM 8.5 (L) 02/05/2019   GFRAA 95 02/05/2019    Speciality Comments: PLQ eye exam: 01/18/2019 normal @ [facility]. Follow up in 1 year.  Procedures:  No procedures performed Allergies: Fruit extracts and Zinc   Assessment / Plan:     Visit Diagnoses: Seronegative rheumatoid arthritis (HCC) - U/S+ syonvitis B wrists/left MCP, RF-, CCP-, 14-3-3 eta-: She has no synovitis on exam.  She is clinically been doing well on Plaquenil 200 mg 1 tablet by mouth twice daily.  She has a Plaquenil for about 1 month due to going through a depressive episode and not wanting to take her  medications.  She states during that time she experienced increased arthralgias which have resolved since resuming Plaquenil 1 week ago.  She was encouraged to continue to take Plaquenil as prescribed.  She would like to recheck RF, anti-CCP, 14 3 3  eta, and sed rate today.  She was advised to notify if she develops increased joint pain or joint swelling.  She will follow-up in the office in 5 months.- Plan: Sedimentation rate, Cyclic citrul peptide antibody, IgG, Rheumatoid factor, 14-3-3 eta Protein  High risk medication use - Plaquenil 200 mg 1 tablet by mouth twice daily. eye exam: 01/18/2019.  CBC and CMP were drawn today to monitor for drug toxicity.- Plan: COMPLETE METABOLIC PANEL WITH GFR, CBC with Differential/Platelet  Carpal tunnel syndrome, right upper limb: She experiences discomfort and paresthesias intermittently.  She had a recent cortisone injection performed by Dr. 01/20/2019 her pain management specialist.  Her symptoms have started to improve.  Patellofemoral disorder of left knee: She is not experiencing any discomfort at this time.  She is good range of motion with no warmth or effusion.  Chronic SI joint pain: No SI joint tenderness on exam.  Spondylolysis, lumbar region: She is not experiencing any lower back pain currently.  She has no symptoms of radiculopathy.  Chronic pain syndrome: She is followed by Dr. Jordan Likes her pain management specialist.  Other fatigue -She has chronic fatigue which has been stable overall.  She is concerned about the diagnosis of lupus since she has a family history of lupus in her maternal grandmother.  She requested to have lab work rechecked today.  Plan: ANA, Anti-DNA antibody, double-stranded, C3 and C4  Polyarthralgia -she continues to experience occasional arthralgias which have been well managed on Plaquenil.  She is concerned about underlying systemic lupus due to her family history.  We will obtain the following lab work.  Plan: ANA,  Anti-DNA antibody, double-stranded, C3 and C4  Family history of systemic lupus erythematosus -maternal grandmother-The following lab work will be obtained today.  Plan: ANA, Anti-DNA antibody, double-stranded, C3 and C4  Other medical conditions are listed as follows:   Type 1 diabetes mellitus with diabetic polyneuropathy (HCC)  History of depression  Diabetic gastroparesis (HCC)  History of asthma  Vitamin D deficiency  Vitamin B12 deficiency  History of gastroesophageal reflux (GERD)  History of iron deficiency anemia  BMI 45.0-49.9, adult (HCC)    Orders: Orders Placed This Encounter  Procedures  . COMPLETE METABOLIC PANEL WITH GFR  . CBC with Differential/Platelet  . Sedimentation rate  . Cyclic citrul peptide antibody, IgG  . Rheumatoid factor  . 14-3-3 eta Protein  . ANA  .  Anti-DNA antibody, double-stranded  . C3 and C4   No orders of the defined types were placed in this encounter.    Follow-Up Instructions: Return in about 5 months (around 11/11/2019).   Gearldine Bienenstock, PA-C  Note - This record has been created using Dragon software.  Chart creation errors have been sought, but may not always  have been located. Such creation errors do not reflect on  the standard of medical care.

## 2019-06-11 ENCOUNTER — Ambulatory Visit (INDEPENDENT_AMBULATORY_CARE_PROVIDER_SITE_OTHER): Payer: No Typology Code available for payment source | Admitting: Physician Assistant

## 2019-06-11 ENCOUNTER — Other Ambulatory Visit: Payer: Self-pay

## 2019-06-11 ENCOUNTER — Encounter: Payer: Self-pay | Admitting: Physician Assistant

## 2019-06-11 VITALS — BP 146/99 | HR 91 | Resp 16 | Ht 64.0 in | Wt 273.8 lb

## 2019-06-11 DIAGNOSIS — Z6841 Body Mass Index (BMI) 40.0 and over, adult: Secondary | ICD-10-CM

## 2019-06-11 DIAGNOSIS — E1143 Type 2 diabetes mellitus with diabetic autonomic (poly)neuropathy: Secondary | ICD-10-CM

## 2019-06-11 DIAGNOSIS — M222X2 Patellofemoral disorders, left knee: Secondary | ICD-10-CM | POA: Diagnosis not present

## 2019-06-11 DIAGNOSIS — Z862 Personal history of diseases of the blood and blood-forming organs and certain disorders involving the immune mechanism: Secondary | ICD-10-CM

## 2019-06-11 DIAGNOSIS — R5383 Other fatigue: Secondary | ICD-10-CM

## 2019-06-11 DIAGNOSIS — Z79899 Other long term (current) drug therapy: Secondary | ICD-10-CM

## 2019-06-11 DIAGNOSIS — M0600 Rheumatoid arthritis without rheumatoid factor, unspecified site: Secondary | ICD-10-CM

## 2019-06-11 DIAGNOSIS — E538 Deficiency of other specified B group vitamins: Secondary | ICD-10-CM

## 2019-06-11 DIAGNOSIS — Z8709 Personal history of other diseases of the respiratory system: Secondary | ICD-10-CM

## 2019-06-11 DIAGNOSIS — E559 Vitamin D deficiency, unspecified: Secondary | ICD-10-CM

## 2019-06-11 DIAGNOSIS — K3184 Gastroparesis: Secondary | ICD-10-CM

## 2019-06-11 DIAGNOSIS — E1042 Type 1 diabetes mellitus with diabetic polyneuropathy: Secondary | ICD-10-CM

## 2019-06-11 DIAGNOSIS — M2550 Pain in unspecified joint: Secondary | ICD-10-CM

## 2019-06-11 DIAGNOSIS — M4306 Spondylolysis, lumbar region: Secondary | ICD-10-CM

## 2019-06-11 DIAGNOSIS — Z8659 Personal history of other mental and behavioral disorders: Secondary | ICD-10-CM

## 2019-06-11 DIAGNOSIS — Z8719 Personal history of other diseases of the digestive system: Secondary | ICD-10-CM

## 2019-06-11 DIAGNOSIS — G894 Chronic pain syndrome: Secondary | ICD-10-CM

## 2019-06-11 DIAGNOSIS — M533 Sacrococcygeal disorders, not elsewhere classified: Secondary | ICD-10-CM

## 2019-06-11 DIAGNOSIS — G5601 Carpal tunnel syndrome, right upper limb: Secondary | ICD-10-CM | POA: Diagnosis not present

## 2019-06-11 DIAGNOSIS — G8929 Other chronic pain: Secondary | ICD-10-CM

## 2019-06-11 DIAGNOSIS — Z8269 Family history of other diseases of the musculoskeletal system and connective tissue: Secondary | ICD-10-CM

## 2019-06-13 NOTE — Progress Notes (Signed)
CBC and CMP stable.  Sed rate is elevated-45 but stable.  ANA is a very low nonspecific titer.  Complements within normal limits and double-stranded DNA was negative.  RF and anti-CCP  were negative.

## 2019-06-16 LAB — COMPLETE METABOLIC PANEL WITH GFR
AG Ratio: 1.1 (calc) (ref 1.0–2.5)
ALT: 9 U/L (ref 6–29)
AST: 9 U/L — ABNORMAL LOW (ref 10–30)
Albumin: 3.7 g/dL (ref 3.6–5.1)
Alkaline phosphatase (APISO): 64 U/L (ref 31–125)
BUN: 14 mg/dL (ref 7–25)
CO2: 29 mmol/L (ref 20–32)
Calcium: 9.3 mg/dL (ref 8.6–10.2)
Chloride: 105 mmol/L (ref 98–110)
Creat: 0.8 mg/dL (ref 0.50–1.10)
GFR, Est African American: 117 mL/min/{1.73_m2} (ref >=60)
GFR, Est Non African American: 101 mL/min/{1.73_m2} (ref >=60)
Globulin: 3.3 g/dL (calc) (ref 1.9–3.7)
Glucose, Bld: 93 mg/dL (ref 65–99)
Potassium: 4.9 mmol/L (ref 3.5–5.3)
Sodium: 138 mmol/L (ref 135–146)
Total Bilirubin: 0.4 mg/dL (ref 0.2–1.2)
Total Protein: 7 g/dL (ref 6.1–8.1)

## 2019-06-16 LAB — CBC WITH DIFFERENTIAL/PLATELET
Absolute Monocytes: 394 cells/uL (ref 200–950)
Basophils Absolute: 61 cells/uL (ref 0–200)
Basophils Relative: 0.9 %
Eosinophils Absolute: 68 cells/uL (ref 15–500)
Eosinophils Relative: 1 %
HCT: 34.1 % — ABNORMAL LOW (ref 35.0–45.0)
Hemoglobin: 10.8 g/dL — ABNORMAL LOW (ref 11.7–15.5)
Lymphs Abs: 2999 cells/uL (ref 850–3900)
MCH: 25.5 pg — ABNORMAL LOW (ref 27.0–33.0)
MCHC: 31.7 g/dL — ABNORMAL LOW (ref 32.0–36.0)
MCV: 80.6 fL (ref 80.0–100.0)
MPV: 12.8 fL — ABNORMAL HIGH (ref 7.5–12.5)
Monocytes Relative: 5.8 %
Neutro Abs: 3278 cells/uL (ref 1500–7800)
Neutrophils Relative %: 48.2 %
Platelets: 217 10*3/uL (ref 140–400)
RBC: 4.23 10*6/uL (ref 3.80–5.10)
RDW: 13.2 % (ref 11.0–15.0)
Total Lymphocyte: 44.1 %
WBC: 6.8 10*3/uL (ref 3.8–10.8)

## 2019-06-16 LAB — ANTI-NUCLEAR AB-TITER (ANA TITER): ANA Titer 1: 1:40 {titer} — ABNORMAL HIGH

## 2019-06-16 LAB — 14-3-3 ETA PROTEIN: 14-3-3 eta Protein: <0.2 ng/mL (ref <0.2)

## 2019-06-16 LAB — ANTI-DNA ANTIBODY, DOUBLE-STRANDED: ds DNA Ab: <1 IU/mL

## 2019-06-16 LAB — C3 AND C4
C3 Complement: 149 mg/dL (ref 83–193)
C4 Complement: 40 mg/dL (ref 15–57)

## 2019-06-16 LAB — CYCLIC CITRUL PEPTIDE ANTIBODY, IGG: Cyclic Citrullin Peptide Ab: <16 UNITS

## 2019-06-16 LAB — RHEUMATOID FACTOR: Rheumatoid fact SerPl-aCnc: <14 IU/mL (ref <14)

## 2019-06-16 LAB — ANA: Anti Nuclear Antibody (ANA): POSITIVE — AB

## 2019-06-16 LAB — SEDIMENTATION RATE: Sed Rate: 45 mm/h — ABNORMAL HIGH (ref 0–20)

## 2019-06-17 NOTE — Progress Notes (Signed)
14-3-3 eta negative.

## 2019-11-04 NOTE — Progress Notes (Signed)
Office Visit Note  Patient: [name]             Date of Birth: 01-15-1991           MRN: [id number]             PCP: Jonathon Jordan, MD Referring: Jonathon Jordan, MD Visit Date: 11/15/2019 Occupation: _0 @  Subjective:  Medication monitoring   History of Present Illness: [name] is a 29 y.o. female with history of seronegative rheumatoid arthritis.  She is taking plaquenil 200 mg 1 tablet by mouth twice daily.   She denies any recent rheumatoid arthritis flares.  She is not having any joint pain or joint swelling at this time. She was evaluated by her dermatologist for a rash on her arms and legs.  She was advised to avoid products with fragrance and to avoid hot showers.  She has a known history of eczema, usually on her torso, but she states this rash appeared to be different than eczema.  She tried using triamcinolone cream but did not notice any improvement.  The rash has since resolved so she has not seen her dermatologist for a biopsy.   Activities of Daily Living:  Patient reports morning stiffness for 1-1.5  hours.   Patient Reports nocturnal pain.  Difficulty dressing/grooming: Denies Difficulty climbing stairs: Denies Difficulty getting out of chair: Denies Difficulty using hands for taps, buttons, cutlery, and/or writing: Reports  Review of Systems  Constitutional: Positive for fatigue.  HENT: Positive for mouth sores and nose dryness. Negative for mouth dryness.        Nose sores  Eyes: Negative for itching and dryness.  Respiratory: Negative for shortness of breath and difficulty breathing.   Cardiovascular: Negative for chest pain and palpitations.  Gastrointestinal: Positive for abdominal pain and nausea. Negative for constipation and diarrhea.  Endocrine: Negative for increased urination.  Genitourinary: Negative for difficulty urinating.  Musculoskeletal: Positive for myalgias, morning stiffness, muscle tenderness and myalgias. Negative for  arthralgias, joint pain and joint swelling.  Skin: Positive for rash. Negative for color change.  Allergic/Immunologic: Negative for susceptible to infections.  Neurological: Positive for dizziness and headaches. Negative for numbness, memory loss and weakness.  Hematological: Positive for bruising/bleeding tendency.  Psychiatric/Behavioral: Negative for confusion.    PMFS History:  Patient Active Problem List   Diagnosis Date Noted  . Chronic low back pain 11/22/2013  . Clinical depression 10/07/2013  . Extreme obesity 10/07/2013  . Diabetic neuropathy ([place]) 08/06/2013  . General patient noncompliance 12/21/2012  . 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 Appetite disorder 12/30/2010  . BP (high blood pressure) 12/30/2010  . Muscle ache 12/30/2010  . Cachectic ([place]) 12/30/2010  . 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 Diabetes mellitus 09/03/2010    Past Medical History:  Diagnosis Date  . Anemia   . Arthritis   . Asthma   . Diabetes mellitus without complication ([place])   . Hypertension   . Vitamin D deficiency     Family History  Problem Relation Age of Onset  . Lupus Maternal Grandmother   . Asthma Sister   . ADD / ADHD Sister    Past Surgical History:  Procedure Laterality Date  . GASTRIC BYPASS  07/2017   Social History   Social History Narrative  . Not on file   Immunization History  Administered Date(s) Administered  . PFIZER SARS-COV-2 Vaccination 05/28/2019, 06/18/2019     Objective: Vital Signs: BP (!) 145/88 (BP Location: Left  Wrist, Patient Position: Sitting, Cuff Size: Normal)   Pulse 94   Resp 17   Ht 5' 4" (1.626 m)   Wt 280 lb 3.2 oz (127.1 kg)   BMI 48.10 kg/m    Physical Exam Vitals and nursing note reviewed.  Constitutional:      Appearance: She is well-developed.  HENT:     Head: Normocephalic and atraumatic.  Eyes:     Conjunctiva/sclera: Conjunctivae normal.  Pulmonary:     Effort: Pulmonary effort is normal.    Abdominal:     Palpations: Abdomen is soft.  Musculoskeletal:     Cervical back: Normal range of motion.  Lymphadenopathy:     Cervical: No cervical adenopathy.  Skin:    General: Skin is warm and dry.     Capillary Refill: Capillary refill takes less than 2 seconds.  Neurological:     Mental Status: She is alert and oriented to person, place, and time.  Psychiatric:        Behavior: Behavior normal.      Musculoskeletal Exam: C-spine, thoracic spine, and lumbar spine good ROM.  Shoulder joints, elbow joints, wrist joints, MCPs, PIPs, DIPs have good range of motion with no synovitis.  She has complete fist formation bilaterally.  Hip joints, knee joints, ankle joints, MTPs, PIPs, DIPs have good range of motion with no synovitis. No warmth or effusion of knee joints.  No tenderness or swelling of ankle joints.  CDAI Exam: CDAI Score: 0.4  Patient Global: 2 mm; Provider Global: 2 mm Swollen: 0 ; Tender: 0  Joint Exam 11/15/2019   No joint exam has been documented for this visit   There is currently no information documented on the homunculus. Go to the Rheumatology activity and complete the homunculus joint exam.  Investigation: No additional findings.  Imaging: No results found.  Recent Labs: Lab Results  Component Value Date   WBC 6.8 06/11/2019   HGB 10.8 (L) 06/11/2019   PLT 217 06/11/2019   NA 138 06/11/2019   K 4.9 06/11/2019   CL 105 06/11/2019   CO2 29 06/11/2019   GLUCOSE 93 06/11/2019   BUN 14 06/11/2019   CREATININE 0.80 06/11/2019   BILITOT 0.4 06/11/2019   ALKPHOS 125 02/01/2007   AST 9 (L) 06/11/2019   ALT 9 06/11/2019   PROT 7.0 06/11/2019   ALBUMIN 4.3 02/01/2007   CALCIUM 9.3 06/11/2019   GFRAA 117 06/11/2019    Speciality Comments: PLQ eye exam: 01/18/2019 normal @ [facility]. Follow up in 1 year.  Procedures:  No procedures performed Allergies: Fruit extracts and Zinc   Assessment / Plan:     Visit Diagnoses:  Seronegative rheumatoid arthritis ([place]) - U/S+ syonvitis B wrists/left MCP, RF-, CCP-, 14-3-3 eta-: She has no synovitis or tenderness on exam.  She has not had any recent rheumatoid arthritis flares.  She is clinically doing well on Plaquenil 200 mg 1 tablet by mouth twice daily.  She is not experiencing any joint pain or inflammation at this time.  She will continue taking Plaquenil as prescribed.  She does not need a refill at this time.  She was advised to notify us if she develops increased joint pain or joint swelling.  She will follow-up in the office in 5 months.  High risk medication use - Plaquenil 200 mg 1 tablet by mouth twice daily. PLQ eye exam: 01/18/2019.  CBC and CMP were drawn on 06/11/2019.  She is due to update CBC and CMP today.  Orders were released.- Plan: CBC with Differential/Platelet, COMPLETE METABOLIC PANEL WITH GFR She has received both covid-19 vaccinations and was encouraged to receive the 3rd dose.  She was advised to notify us or her PCP if she develops the covid-19 infection in order to receive the antibody infusion.   Carpal tunnel syndrome, right upper limb: Asymptomatic at this time.   Patellofemoral disorder of left knee: She has good ROM with no discomfort.  No warmth or effusion of the left knee joint noted.   Chronic SI joint pain: No SI joint tenderness.   Spondylolysis, lumbar region: She is not having any lower back at this time.  No symptoms of radiculopathy.   Chronic pain syndrome - She is followed by Dr. Vira Blanco her pain management specialist.  Positive ANA (antinuclear antibody) -06/11/19: ANA 1:40 Nuclear, speckled, dsDNA-, complements WNL, RF-, CCP-, 14-3-3 eta-, ESR was 45-stable: Lab work from 06/11/2019 was reviewed with the patient today in the office.  She does not have any clinical features of systemic lupus.  She is concerned that her ANA is positive and would like to recheck some of the lab work today.  Orders were released.  She was advised to  notify us if she develops any new or worsening symptoms.  She will be planning pregnancy within 1 year. We discussed that PLQ is safe to take during pregnancy and while breastfeeding.  Plan: CBC with Differential/Platelet, COMPLETE METABOLIC PANEL WITH GFR, Anti-DNA antibody, double-stranded, C3 and C4, Sedimentation rate, ANA, Lupus Anticoagulant Eval w/Reflex, Cardiolipin antibodies, IgG, IgM, IgA, Beta-2 glycoprotein antibodies  History of eczema: Since childhood-She has a known history of eczema, typically flares on her torso.  She is oldest child swollen cream topically as needed which typically resolves the rash.   She was evaluated by her dermatologist (Dr. Randye Lobo) on 09/19/2019 for new lesion on her right upper extremity.  She was advised to avoid detergents with fragrance and was encouraged to avoid prolonged hot showers.  The lesion was not consistent with typical eczema.  She had tried using triamcinolone cream topically but did not notice any improvement.  The lesion has since resolved.  If the lesion returns she plans on following back up with her dermatologist to have a biopsy performed.  Other fatigue: Stable.   Other medical conditions are listed as follows:   Family history of systemic lupus erythematosus - maternal grandmother-  Diabetic gastroparesis ([place])  History of asthma  Type 1 diabetes mellitus with diabetic polyneuropathy (H[facility])  History of depression  History of gastroesophageal reflux (GERD)  Vitamin B12 deficiency  Vitamin D deficiency  History of iron deficiency anemia  BMI 45.0-49.9, adult ([place])    Orders: Orders Placed This Encounter  Procedures  . CBC with Differential/Platelet  . COMPLETE METABOLIC PANEL WITH GFR  . Anti-DNA antibody, double-stranded  . C3 and C4  . Sedimentation rate  . ANA  . Lupus Anticoagulant Eval w/Reflex  . Cardiolipin antibodies, IgG, IgM, IgA  . Beta-2 glycoprotein antibodies   No orders of the defined types were  placed in this encounter.    Follow-Up Instructions: Return in about 5 months (around 04/16/2020) for Rheumatoid arthritis.   Ofilia Neas, PA-C  Note - This record has been created using Dragon software.  Chart creation errors have been sought, but may not always  have been located. Such creation errors do not reflect on  the standard of medical care.

## 2019-11-11 NOTE — Telephone Encounter (Signed)
Please schedule office visit for further evaluation.  Please obtain dermatology records prior to the patients appointment.

## 2019-11-15 ENCOUNTER — Other Ambulatory Visit: Payer: Self-pay

## 2019-11-15 ENCOUNTER — Ambulatory Visit (INDEPENDENT_AMBULATORY_CARE_PROVIDER_SITE_OTHER): Payer: No Typology Code available for payment source | Admitting: Physician Assistant

## 2019-11-15 ENCOUNTER — Encounter: Payer: Self-pay | Admitting: Physician Assistant

## 2019-11-15 VITALS — BP 145/88 | HR 94 | Resp 17 | Ht 64.0 in | Wt 280.2 lb

## 2019-11-15 DIAGNOSIS — G894 Chronic pain syndrome: Secondary | ICD-10-CM

## 2019-11-15 DIAGNOSIS — M533 Sacrococcygeal disorders, not elsewhere classified: Secondary | ICD-10-CM

## 2019-11-15 DIAGNOSIS — E559 Vitamin D deficiency, unspecified: Secondary | ICD-10-CM

## 2019-11-15 DIAGNOSIS — G5601 Carpal tunnel syndrome, right upper limb: Secondary | ICD-10-CM

## 2019-11-15 DIAGNOSIS — M0600 Rheumatoid arthritis without rheumatoid factor, unspecified site: Secondary | ICD-10-CM | POA: Diagnosis not present

## 2019-11-15 DIAGNOSIS — Z79899 Other long term (current) drug therapy: Secondary | ICD-10-CM

## 2019-11-15 DIAGNOSIS — R7689 Other specified abnormal immunological findings in serum: Secondary | ICD-10-CM

## 2019-11-15 DIAGNOSIS — R5383 Other fatigue: Secondary | ICD-10-CM

## 2019-11-15 DIAGNOSIS — Z862 Personal history of diseases of the blood and blood-forming organs and certain disorders involving the immune mechanism: Secondary | ICD-10-CM

## 2019-11-15 DIAGNOSIS — E1143 Type 2 diabetes mellitus with diabetic autonomic (poly)neuropathy: Secondary | ICD-10-CM

## 2019-11-15 DIAGNOSIS — M222X2 Patellofemoral disorders, left knee: Secondary | ICD-10-CM

## 2019-11-15 DIAGNOSIS — R768 Other specified abnormal immunological findings in serum: Secondary | ICD-10-CM

## 2019-11-15 DIAGNOSIS — G8929 Other chronic pain: Secondary | ICD-10-CM

## 2019-11-15 DIAGNOSIS — Z8719 Personal history of other diseases of the digestive system: Secondary | ICD-10-CM

## 2019-11-15 DIAGNOSIS — M2550 Pain in unspecified joint: Secondary | ICD-10-CM

## 2019-11-15 DIAGNOSIS — E538 Deficiency of other specified B group vitamins: Secondary | ICD-10-CM

## 2019-11-15 DIAGNOSIS — M4306 Spondylolysis, lumbar region: Secondary | ICD-10-CM

## 2019-11-15 DIAGNOSIS — Z8269 Family history of other diseases of the musculoskeletal system and connective tissue: Secondary | ICD-10-CM

## 2019-11-15 DIAGNOSIS — K3184 Gastroparesis: Secondary | ICD-10-CM

## 2019-11-15 DIAGNOSIS — Z8709 Personal history of other diseases of the respiratory system: Secondary | ICD-10-CM

## 2019-11-15 DIAGNOSIS — E1042 Type 1 diabetes mellitus with diabetic polyneuropathy: Secondary | ICD-10-CM

## 2019-11-15 DIAGNOSIS — Z872 Personal history of diseases of the skin and subcutaneous tissue: Secondary | ICD-10-CM

## 2019-11-15 DIAGNOSIS — Z8659 Personal history of other mental and behavioral disorders: Secondary | ICD-10-CM

## 2019-11-15 DIAGNOSIS — Z6841 Body Mass Index (BMI) 40.0 and over, adult: Secondary | ICD-10-CM

## 2019-11-15 NOTE — Patient Instructions (Signed)

## 2019-11-19 LAB — LUPUS ANTICOAGULANT EVAL W/ REFLEX
PTT-LA Screen: 31 s (ref <=40)
dRVVT: 39 s (ref <=45)

## 2019-11-19 LAB — COMPLETE METABOLIC PANEL WITH GFR
AG Ratio: 1.3 (calc) (ref 1.0–2.5)
ALT: 9 U/L (ref 6–29)
AST: 10 U/L (ref 10–30)
Albumin: 3.7 g/dL (ref 3.6–5.1)
Alkaline phosphatase (APISO): 56 U/L (ref 31–125)
BUN: 10 mg/dL (ref 7–25)
CO2: 26 mmol/L (ref 20–32)
Calcium: 8.6 mg/dL (ref 8.6–10.2)
Chloride: 104 mmol/L (ref 98–110)
Creat: 0.77 mg/dL (ref 0.50–1.10)
GFR, Est African American: 122 mL/min/{1.73_m2} (ref >=60)
GFR, Est Non African American: 105 mL/min/{1.73_m2} (ref >=60)
Globulin: 2.9 g/dL (calc) (ref 1.9–3.7)
Glucose, Bld: 219 mg/dL — ABNORMAL HIGH (ref 65–99)
Potassium: 4.3 mmol/L (ref 3.5–5.3)
Sodium: 136 mmol/L (ref 135–146)
Total Bilirubin: 0.4 mg/dL (ref 0.2–1.2)
Total Protein: 6.6 g/dL (ref 6.1–8.1)

## 2019-11-19 LAB — CBC WITH DIFFERENTIAL/PLATELET
Absolute Monocytes: 410 cells/uL (ref 200–950)
Basophils Absolute: 38 cells/uL (ref 0–200)
Basophils Relative: 0.7 %
Eosinophils Absolute: 81 cells/uL (ref 15–500)
Eosinophils Relative: 1.5 %
HCT: 32.9 % — ABNORMAL LOW (ref 35.0–45.0)
Hemoglobin: 10.1 g/dL — ABNORMAL LOW (ref 11.7–15.5)
Lymphs Abs: 2133 cells/uL (ref 850–3900)
MCH: 25 pg — ABNORMAL LOW (ref 27.0–33.0)
MCHC: 30.7 g/dL — ABNORMAL LOW (ref 32.0–36.0)
MCV: 81.4 fL (ref 80.0–100.0)
MPV: 13.5 fL — ABNORMAL HIGH (ref 7.5–12.5)
Monocytes Relative: 7.6 %
Neutro Abs: 2738 cells/uL (ref 1500–7800)
Neutrophils Relative %: 50.7 %
Platelets: 151 10*3/uL (ref 140–400)
RBC: 4.04 10*6/uL (ref 3.80–5.10)
RDW: 13.9 % (ref 11.0–15.0)
Total Lymphocyte: 39.5 %
WBC: 5.4 10*3/uL (ref 3.8–10.8)

## 2019-11-19 LAB — C3 AND C4
C3 Complement: 118 mg/dL (ref 83–193)
C4 Complement: 32 mg/dL (ref 15–57)

## 2019-11-19 LAB — CARDIOLIPIN ANTIBODIES, IGG, IGM, IGA
Anticardiolipin IgA: <2 APL-U/mL
Anticardiolipin IgG: <2 GPL-U/mL
Anticardiolipin IgM: <2 MPL-U/mL

## 2019-11-19 LAB — ANTI-DNA ANTIBODY, DOUBLE-STRANDED: ds DNA Ab: <1 IU/mL

## 2019-11-19 LAB — SEDIMENTATION RATE: Sed Rate: 14 mm/h (ref 0–20)

## 2019-11-19 LAB — BETA-2 GLYCOPROTEIN ANTIBODIES
Beta-2 Glyco 1 IgA: <2 U/mL
Beta-2 Glyco 1 IgM: <2 U/mL
Beta-2 Glyco I IgG: <2 U/mL

## 2019-11-19 LAB — ANTI-NUCLEAR AB-TITER (ANA TITER): ANA Titer 1: 1:40 {titer} — ABNORMAL HIGH

## 2019-11-19 LAB — ANA: Anti Nuclear Antibody (ANA): POSITIVE — AB

## 2019-11-19 NOTE — Progress Notes (Signed)
Hgb and hct are low but stable. Please forward to PCP.   Glucose is elevated-219.  Rest of CMP WNL.  ANA is low, nonspecific titer.  ESR WNL.  Complements WNL and dsDNA is negative.  Lupus anticoagulant, anticardiolipin antibodies negative, and beta-2 glycoproteins negative.

## 2019-12-31 ENCOUNTER — Ambulatory Visit: Payer: 59 | Admitting: Nutrition

## 2020-01-28 ENCOUNTER — Encounter: Payer: Self-pay | Admitting: Physician Assistant

## 2020-01-28 LAB — HM DIABETES EYE EXAM

## 2020-02-03 ENCOUNTER — Ambulatory Visit: Payer: 59 | Admitting: Dietician

## 2020-03-21 HISTORY — PX: CATARACT EXTRACTION, BILATERAL: SHX1313

## 2020-04-03 NOTE — Progress Notes (Signed)
Office Visit Note  Patient: [name]             Date of Birth: 06/18/1990           MRN: [id number]             PCP: Jonathon Jordan, MD Referring: Jonathon Jordan, MD Visit Date: 04/17/2020 Occupation: _0 @  Subjective:  Medication monitoring   History of Present Illness: [name] is a 30 y.o. female with history of seronegative rheumatoid arthritis.  She is taking plaquenil 200 mg 1 tablet by mouth twice daily.  She is tolerating Plaquenil without any side effects and has not missed any doses recently.  She denies any recent rheumatoid arthritis flares.  She has not noticed any joint swelling.  She continues to see pain management every 6 months.  She has persistent lower back pain and is due for an injection at pain management.  She is continue to have nocturnal pain.  She has recently started on trazodone and has been taking 100 mg by mouth at bedtime to help with insomnia.  She is experiencing increased fatigue over the past several weeks. Patient ports that she is scheduled for cataract surgery in April 2022.  She states that she had her routine Plaquenil eye exam performed in November 2021.   Activities of Daily Living:  Patient reports morning stiffness for 30-60 minutes.   Patient Reports nocturnal pain.  Difficulty dressing/grooming: Denies Difficulty climbing stairs: Denies Difficulty getting out of chair: Denies Difficulty using hands for taps, buttons, cutlery, and/or writing: Reports  Review of Systems  Constitutional: Positive for fatigue.  HENT: Positive for nose dryness. Negative for mouth sores and mouth dryness.   Eyes: Positive for pain. Negative for itching and dryness.  Respiratory: Negative for shortness of breath and difficulty breathing.   Cardiovascular: Negative for chest pain and palpitations.  Gastrointestinal: Negative for blood in stool, constipation and diarrhea.  Endocrine: Negative for increased urination.  Genitourinary: Negative  for difficulty urinating.  Musculoskeletal: Positive for arthralgias, joint pain, joint swelling, myalgias, muscle weakness, morning stiffness, muscle tenderness and myalgias.  Skin: Negative for color change, rash and redness.  Allergic/Immunologic: Positive for susceptible to infections.  Neurological: Positive for dizziness, headaches and weakness. Negative for numbness and memory loss.  Hematological: Positive for bruising/bleeding tendency.  Psychiatric/Behavioral: Positive for decreased concentration. Negative for confusion.    PMFS History:  Patient Active Problem List   Diagnosis Date Noted  . Chronic low back pain 11/22/2013  . Clinical depression 10/07/2013  . Extreme obesity 10/07/2013  . Diabetic neuropathy ([place]) 08/06/2013  . General patient noncompliance 12/21/2012  . 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 Appetite disorder 12/30/2010  . BP (high blood pressure) 12/30/2010  . Muscle ache 12/30/2010  . Cachectic ([place]) 12/30/2010  . 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 Diabetes mellitus 09/03/2010    Past Medical History:  Diagnosis Date  . Anemia   . Arthritis   . Asthma   . Diabetes mellitus without complication ([place])   . Hypertension   . Vitamin D deficiency     Family History  Problem Relation Age of Onset  . Lupus Maternal Grandmother   . Asthma Sister   . ADD / ADHD Sister    Past Surgical History:  Procedure Laterality Date  . GASTRIC BYPASS  07/2017   Social History   Social History Narrative  . Not on file   Immunization History  Administered Date(s) Administered  . PFIZER([place])SARS-COV-2 Vaccination 05/28/2019, 06/18/2019     Objective: Vital Signs: BP (!) 155/98 (BP Location: Left Wrist, Patient Position: Sitting, Cuff Size: Normal)   Pulse 86   Resp 17   Ht _0  (1.626 m)   Wt 283 lb (128.4 kg)   BMI 48.58 kg/m    Physical Exam Vitals and nursing note reviewed.  Constitutional:      Appearance: She is  well-developed and well-nourished.  HENT:     Head: Normocephalic and atraumatic.  Eyes:     Extraocular Movements: EOM normal.     Conjunctiva/sclera: Conjunctivae normal.  Cardiovascular:     Pulses: Intact distal pulses.  Pulmonary:     Effort: Pulmonary effort is normal.  Abdominal:     Palpations: Abdomen is soft.  Musculoskeletal:     Cervical back: Normal range of motion.  Skin:    General: Skin is warm and dry.     Capillary Refill: Capillary refill takes less than 2 seconds.  Neurological:     Mental Status: She is alert and oriented to person, place, and time.  Psychiatric:        Mood and Affect: Mood and affect normal.        Behavior: Behavior normal.      Musculoskeletal Exam: C-spine has limited range of motion with lateral rotation.  Trapezius muscle tension and muscle tenderness bilaterally.  Painful range of motion of lumbar spine.  Midline spinal tenderness in the lumbar region.  Shoulder joints, elbow joints, wrist joints, MCPs, PIPs, DIPs good range of motion with no synovitis.  Tenderness over the right second MCP and PIP joint but no synovitis was noted.  She is able to make a complete fist bilaterally.  Hip joints have good range of motion with no discomfort.  Knee joints have good range of motion with no warmth or effusion.  Mild crepitus in the left knee was noted.  Ankle joints have good range of motion with no tenderness or inflammation.  CDAI Exam: CDAI Score: 2.8  Patient Global: 4 mm; Provider Global: 4 mm Swollen: 0 ; Tender: 2  Joint Exam 04/17/2020      Right  Left  MCP 2   Tender     PIP 2   Tender        Investigation: No additional findings.  Imaging: No results found.  Recent Labs: Lab Results  Component Value Date   WBC 5.4 11/15/2019   HGB 10.1 (L) 11/15/2019   PLT 151 11/15/2019   NA 136 11/15/2019   K 4.3 11/15/2019   CL 104 11/15/2019   CO2 26 11/15/2019   GLUCOSE 219 (H) 11/15/2019   BUN 10 11/15/2019   CREATININE  0.77 11/15/2019   BILITOT 0.4 11/15/2019   ALKPHOS 125 02/01/2007   AST 10 11/15/2019   ALT 9 11/15/2019   PROT 6.6 11/15/2019   ALBUMIN 4.3 02/01/2007   CALCIUM 8.6 11/15/2019   GFRAA 122 11/15/2019    Speciality Comments: PLQ eye exam: 01/18/2019 normal @ [facility]. Follow up in 1 year.  Procedures:  No procedures performed Allergies: Fruit extracts and Zinc   Assessment / Plan:     Visit Diagnoses: Seronegative rheumatoid arthritis ([place]) -  U/S+ syonvitis Bilateral wrists/left MCP, RF-, CCP-, 14-3-3 eta negative: She has tenderness over the right second MCP and second PIP joint no synovitis was noted on exam.  She has not had any recent rheumatoid arthritis flares.  She has occasional arthralgias and joint stiffness but has  not noticed any joint inflammation.  She is clinically doing well on Plaquenil 200 mg 1 tablet by mouth twice daily.  She is tolerating Plaquenil without any side effects and has not missed any doses recently.  She has not been experiencing any difficulty with ADLs.  She continues to follow-up with pain management every 6 months.  She will continue taking Plaquenil as prescribed.  She was advised to notify us if she develops increased joint pain or joint swelling.  She will follow-up in the office in 5 months.  Plan: hydroxychloroquine (PLAQUENIL) 200 MG tablet  High risk medication use - Plaquenil 200 mg 1 tablet by mouth twice daily. PLQ eye exam: 01/18/2019.  According to the patient she had an updated Plaquenil eye exam November 2021.  We will call to obtain these records.  CBC and CMP were drawn on 11/15/2019.  She is due to update lab work.  Orders for CBC and CMP were released.- Plan: CBC with Differential/Platelet, COMPLETE METABOLIC PANEL WITH GFR She has not had any recent infections.  Carpal tunnel syndrome, right upper limb: She has occasional paresthesias in the right hand.  Her symptoms have been mild and less frequent.  Patellofemoral  disorder of left knee: She has good range of motion of the left knee joint on exam.  No warmth or effusion was noted.  Some left knee crepitus was noted.  Chronic SI joint pain: She has occasional discomfort and stiffness in her SI joints.  She takes hydrocodone as needed for pain relief.  She continues to follow-up with pain management.  Spondylolysis, lumbar region: Chronic pain.  She has an upcoming injection at pain management.  She continues to take hydrocodone as needed for pain relief.  Chronic pain syndrome: She follows up with pain management every 6 months.  She has an upcoming spinal injection scheduled.  She continues to take hydrocodone as needed for pain relief.  She has been experiencing increased nocturnal pain in her back and was recently started on trazodone 100 mg by mouth at bedtime to help her sleep.  Trapezius muscle spasm: She has trapezius muscle tension and tenderness bilaterally.  She was given a salonpas patch which was applied over the left trapezius. Discussed the use of heat and massage for symptomatic relief.   Other fatigue: Chronic and secondary to insomnia.  She was recently started on trazodone 100 mg a mouth at bedtime for insomnia.  Discussed the importance of regular exercise.  Positive ANA (antinuclear antibody) - -06/11/19: ANA 1:40 Nuclear, speckled, dsDNA-, complements WNL, RF-, CCP-, 14-3-3 eta-, ESR was 45-stable:  Lab work from 11/15/19 was reviewed today in the office: dsDNA negative, complements WNL, and ESR WNL.  She has no clinical features of systemic lupus at this time.  She will continue taking Plaquenil as prescribed.  She was advised to notify us if she develops any new or worsening symptoms.  She would like to recheck the following lab work today.   - Plan: Anti-DNA antibody, double-stranded, Sedimentation rate, C3 and C4, ANA  Other medical conditions are listed as follows:  Diabetic gastroparesis ([place])  History of asthma  Type 1 diabetes  mellitus with diabetic polyneuropathy (HCC)  History of depression  History of gastroesophageal reflux (GERD)  Vitamin B12 deficiency  Vitamin D deficiency  History of iron deficiency anemia: She is taking ferrous sulfate 325 mg daily.   Family history of systemic lupus erythematosus   Orders: Orders Placed This Encounter  Procedures  . CBC with Differential/Platelet  .  COMPLETE METABOLIC PANEL WITH GFR  . Anti-DNA antibody, double-stranded  . Sedimentation rate  . C3 and C4  . ANA   Meds ordered this encounter  Medications  . hydroxychloroquine (PLAQUENIL) 200 MG tablet    Sig: TAKE 1 TABLET(200 MG) BY MOUTH TWICE DAILY    Dispense:  180 tablet    Refill:  0      Follow-Up Instructions: Return in about 5 months (around 09/15/2020) for Rheumatoid arthritis.   Ofilia Neas, PA-C  Note - This record has been created using Dragon software.  Chart creation errors have been sought, but may not always  have been located. Such creation errors do not reflect on  the standard of medical care.

## 2020-04-17 ENCOUNTER — Other Ambulatory Visit: Payer: Self-pay

## 2020-04-17 ENCOUNTER — Ambulatory Visit: Payer: 59 | Admitting: Physician Assistant

## 2020-04-17 ENCOUNTER — Encounter: Payer: Self-pay | Admitting: Physician Assistant

## 2020-04-17 VITALS — BP 155/98 | HR 86 | Resp 17 | Ht 64.0 in | Wt 283.0 lb

## 2020-04-17 DIAGNOSIS — M222X2 Patellofemoral disorders, left knee: Secondary | ICD-10-CM | POA: Diagnosis not present

## 2020-04-17 DIAGNOSIS — G894 Chronic pain syndrome: Secondary | ICD-10-CM

## 2020-04-17 DIAGNOSIS — Z862 Personal history of diseases of the blood and blood-forming organs and certain disorders involving the immune mechanism: Secondary | ICD-10-CM

## 2020-04-17 DIAGNOSIS — Z79899 Other long term (current) drug therapy: Secondary | ICD-10-CM

## 2020-04-17 DIAGNOSIS — E538 Deficiency of other specified B group vitamins: Secondary | ICD-10-CM

## 2020-04-17 DIAGNOSIS — M0600 Rheumatoid arthritis without rheumatoid factor, unspecified site: Secondary | ICD-10-CM | POA: Diagnosis not present

## 2020-04-17 DIAGNOSIS — K3184 Gastroparesis: Secondary | ICD-10-CM

## 2020-04-17 DIAGNOSIS — Z8269 Family history of other diseases of the musculoskeletal system and connective tissue: Secondary | ICD-10-CM

## 2020-04-17 DIAGNOSIS — M533 Sacrococcygeal disorders, not elsewhere classified: Secondary | ICD-10-CM

## 2020-04-17 DIAGNOSIS — R7689 Other specified abnormal immunological findings in serum: Secondary | ICD-10-CM

## 2020-04-17 DIAGNOSIS — E559 Vitamin D deficiency, unspecified: Secondary | ICD-10-CM

## 2020-04-17 DIAGNOSIS — G8929 Other chronic pain: Secondary | ICD-10-CM

## 2020-04-17 DIAGNOSIS — E1143 Type 2 diabetes mellitus with diabetic autonomic (poly)neuropathy: Secondary | ICD-10-CM

## 2020-04-17 DIAGNOSIS — Z8659 Personal history of other mental and behavioral disorders: Secondary | ICD-10-CM

## 2020-04-17 DIAGNOSIS — E1042 Type 1 diabetes mellitus with diabetic polyneuropathy: Secondary | ICD-10-CM

## 2020-04-17 DIAGNOSIS — Z8719 Personal history of other diseases of the digestive system: Secondary | ICD-10-CM

## 2020-04-17 DIAGNOSIS — G5601 Carpal tunnel syndrome, right upper limb: Secondary | ICD-10-CM | POA: Diagnosis not present

## 2020-04-17 DIAGNOSIS — R5383 Other fatigue: Secondary | ICD-10-CM

## 2020-04-17 DIAGNOSIS — Z8709 Personal history of other diseases of the respiratory system: Secondary | ICD-10-CM

## 2020-04-17 DIAGNOSIS — R768 Other specified abnormal immunological findings in serum: Secondary | ICD-10-CM

## 2020-04-17 DIAGNOSIS — M4306 Spondylolysis, lumbar region: Secondary | ICD-10-CM

## 2020-04-17 DIAGNOSIS — M62838 Other muscle spasm: Secondary | ICD-10-CM

## 2020-04-17 MED ORDER — HYDROXYCHLOROQUINE SULFATE 200 MG PO TABS: ORAL_TABLET | ORAL | 0 refills | Status: DC

## 2020-04-20 LAB — ANA: Anti Nuclear Antibody (ANA): NEGATIVE

## 2020-04-20 LAB — COMPLETE METABOLIC PANEL WITH GFR
AG Ratio: 1.2 (calc) (ref 1.0–2.5)
ALT: 10 U/L (ref 6–29)
AST: 11 U/L (ref 10–30)
Albumin: 3.9 g/dL (ref 3.6–5.1)
Alkaline phosphatase (APISO): 66 U/L (ref 31–125)
BUN: 8 mg/dL (ref 7–25)
CO2: 27 mmol/L (ref 20–32)
Calcium: 8.9 mg/dL (ref 8.6–10.2)
Chloride: 104 mmol/L (ref 98–110)
Creat: 0.67 mg/dL (ref 0.50–1.10)
GFR, Est African American: 139 mL/min/{1.73_m2} (ref >=60)
GFR, Est Non African American: 120 mL/min/{1.73_m2} (ref >=60)
Globulin: 3.2 g/dL (calc) (ref 1.9–3.7)
Glucose, Bld: 96 mg/dL (ref 65–99)
Potassium: 4.7 mmol/L (ref 3.5–5.3)
Sodium: 138 mmol/L (ref 135–146)
Total Bilirubin: 0.6 mg/dL (ref 0.2–1.2)
Total Protein: 7.1 g/dL (ref 6.1–8.1)

## 2020-04-20 LAB — CBC WITH DIFFERENTIAL/PLATELET
Absolute Monocytes: 400 cells/uL (ref 200–950)
Basophils Absolute: 50 cells/uL (ref 0–200)
Basophils Relative: 1 %
Eosinophils Absolute: 80 cells/uL (ref 15–500)
Eosinophils Relative: 1.6 %
HCT: 36.2 % (ref 35.0–45.0)
Hemoglobin: 11.7 g/dL (ref 11.7–15.5)
Lymphs Abs: 2210 cells/uL (ref 850–3900)
MCH: 25.8 pg — ABNORMAL LOW (ref 27.0–33.0)
MCHC: 32.3 g/dL (ref 32.0–36.0)
MCV: 79.9 fL — ABNORMAL LOW (ref 80.0–100.0)
MPV: 13 fL — ABNORMAL HIGH (ref 7.5–12.5)
Monocytes Relative: 8 %
Neutro Abs: 2260 cells/uL (ref 1500–7800)
Neutrophils Relative %: 45.2 %
Platelets: 228 10*3/uL (ref 140–400)
RBC: 4.53 10*6/uL (ref 3.80–5.10)
RDW: 13.5 % (ref 11.0–15.0)
Total Lymphocyte: 44.2 %
WBC: 5 10*3/uL (ref 3.8–10.8)

## 2020-04-20 LAB — C3 AND C4
C3 Complement: 129 mg/dL (ref 83–193)
C4 Complement: 34 mg/dL (ref 15–57)

## 2020-04-20 LAB — ANTI-DNA ANTIBODY, DOUBLE-STRANDED: ds DNA Ab: <1 IU/mL

## 2020-04-20 LAB — SEDIMENTATION RATE: Sed Rate: 19 mm/h (ref 0–20)

## 2020-04-20 NOTE — Progress Notes (Signed)
ANA is negative.

## 2020-04-20 NOTE — Progress Notes (Signed)
CBC stable. CMP WNL.  Complements and ESR WNL.

## 2020-04-21 NOTE — Progress Notes (Signed)
dsDNA is negative.   No change in therapy recommended at this time.

## 2020-06-15 NOTE — Telephone Encounter (Signed)
Please schedule a sooner office visit to discuss the symptoms she is experiencing and to possibly update lab work.

## 2020-06-16 NOTE — Progress Notes (Addendum)
Office Visit Note  Patient: [name]             Date of Birth: 11/10/1990           MRN: [id number]             PCP: Jonathon Jordan, MD Referring: Jonathon Jordan, MD Visit Date: 06/17/2020 Occupation: '@GUAROCC' @  Subjective:  Fatigue   History of Present Illness: [name] is a 30 y.o. female with history of seronegative rheumatoid arthritis.  She is taking plaquenil 200 mg 1 tablet by mouth by mouth twice daily.  She is tolerating PLQ without any side effects. She denies any joint swelling.  She presents today with increased fatigue which started 1.5 weeks ago.  She states she was very stressed with one of her classes, which likely exacerbated her fatigue and generalized pain.  Patient reports that she is having generalized muscle aches and muscle tenderness.  She states that even her skin feels sensitive to touch.  She describes her pain is constant.  She has not noticed any muscle weakness.  Overall she has been sleeping well at night and has been sleeping about 8 to 9 hours.  She takes trazodone 100 mg by mouth at bedtime which helps her insomnia.  She states that she has been less active due to the profound fatigue.  Patient reports that she was evaluated by her PCP earlier this week and had some lab work drawn.  She is going to be starting on a prescription strength vitamin D supplement for vitamin D deficiency.    Activities of Daily Living:  Patient reports morning stiffness for 2-3 hours.   Patient Reports nocturnal pain.  Difficulty dressing/grooming: Denies Difficulty climbing stairs: Denies Difficulty getting out of chair: Denies Difficulty using hands for taps, buttons, cutlery, and/or writing: Reports  Review of Systems  Constitutional: Positive for fatigue.  HENT: Positive for mouth dryness. Negative for mouth sores and nose dryness.   Eyes: Negative for pain, visual disturbance and dryness.  Respiratory: Negative for cough, hemoptysis, shortness of breath  and difficulty breathing.   Cardiovascular: Negative for chest pain, palpitations, hypertension and swelling in legs/feet.  Gastrointestinal: Negative for blood in stool, constipation and diarrhea.  Endocrine: Negative for increased urination.  Genitourinary: Negative for painful urination.  Musculoskeletal: Positive for arthralgias, joint pain, joint swelling, myalgias, muscle weakness, morning stiffness, muscle tenderness and myalgias.  Skin: Negative for color change, pallor, rash, hair loss, nodules/bumps, skin tightness, ulcers and sensitivity to sunlight.  Allergic/Immunologic: Negative for susceptible to infections.  Neurological: Positive for dizziness and headaches. Negative for numbness and weakness.  Hematological: Negative for swollen glands.  Psychiatric/Behavioral: Positive for depressed mood. Negative for sleep disturbance. The patient is nervous/anxious.     PMFS History:  Patient Active Problem List   Diagnosis Date Noted  . Chronic low back pain 11/22/2013  . Clinical depression 10/07/2013  . Extreme obesity 10/07/2013  . Diabetic neuropathy ([place]) 08/06/2013  . General patient noncompliance 12/21/2012  . 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 Appetite disorder 12/30/2010  . BP (high blood pressure) 12/30/2010  . Muscle ache 12/30/2010  . Cachectic ([place]) 12/30/2010  . 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 Diabetes mellitus 09/03/2010    Past Medical History:  Diagnosis Date  . Anemia   . Arthritis   . Asthma   . Diabetes mellitus without complication ([place])   . Hypertension   . Vitamin D deficiency     Family History  Problem Relation Age of Onset  . Lupus Maternal Grandmother   . Asthma Sister   . ADD / ADHD Sister    Past Surgical History:  Procedure Laterality Date  . GASTRIC BYPASS  07/2017   Social History   Social History Narrative  . Not on file   Immunization History  Administered Date(s) Administered  . PFIZER(Purple  Top)SARS-COV-2 Vaccination 05/28/2019, 06/18/2019, 03/06/2020     Objective: Vital Signs: BP (!) 155/95 (BP Location: Left Wrist, Patient Position: Sitting, Cuff Size: Normal)   Pulse 93   Resp 16   Ht '5\' 4"'  (1.626 m)   Wt 281 lb 6.4 oz (127.6 kg)   BMI 48.30 kg/m    Physical Exam Vitals and nursing note reviewed.  Constitutional:      Appearance: She is well-developed.  HENT:     Head: Normocephalic and atraumatic.  Eyes:     Conjunctiva/sclera: Conjunctivae normal.  Pulmonary:     Effort: Pulmonary effort is normal.  Abdominal:     Palpations: Abdomen is soft.  Musculoskeletal:     Cervical back: Normal range of motion.  Skin:    General: Skin is warm and dry.     Capillary Refill: Capillary refill takes less than 2 seconds.  Neurological:     Mental Status: She is alert and oriented to person, place, and time.  Psychiatric:        Behavior: Behavior normal.      Musculoskeletal Exam: Generalized hyperalgesia and positive tender points on exam.  C-spine has good range of motion with discomfort with lateral rotation.  Trapezius muscle tension and muscle tenderness bilaterally.  Shoulder joints have good range of motion but she has muscle pain in both arms with abduction.  Elbow joints, wrist joints, MCPs, PIPs, DIPs have good range of motion with no synovitis.  Tenderness over the lateral epicondyle bilaterally.  She is able make a complete fist bilaterally.  Hip joints have good range of motion with no discomfort.  Tenderness over the left trochanteric bursa.  Knee joints have good range of motion with no warmth or effusion.  Tenderness on the medial aspect of both knees.  Ankle joints have good range of motion with tenderness over the left ankle. No tenderness over MTP joints.   CDAI Exam: CDAI Score: -- Patient Global: --; Provider Global: -- Swollen: --; Tender: -- Joint Exam 06/17/2020   No joint exam has been documented for this visit   There is currently no  information documented on the homunculus. Go to the Rheumatology activity and complete the homunculus joint exam.  Investigation: No additional findings.  Imaging: No results found.  Recent Labs: Lab Results  Component Value Date   WBC 5.0 04/17/2020   HGB 11.7 04/17/2020   PLT 228 04/17/2020   NA 138 04/17/2020   K 4.7 04/17/2020   CL 104 04/17/2020   CO2 27 04/17/2020   GLUCOSE 96 04/17/2020   BUN 8 04/17/2020   CREATININE 0.67 04/17/2020   BILITOT 0.6 04/17/2020   ALKPHOS 125 02/01/2007   AST 11 04/17/2020   ALT 10 04/17/2020   PROT 7.1 04/17/2020   ALBUMIN 4.3 02/01/2007   CALCIUM 8.9 04/17/2020   GFRAA 139 04/17/2020    Speciality Comments: PLQ eye exam: 01/18/2019 normal @ [facility]. Follow up in 1 year.  Procedures:  No procedures performed Allergies: Fruit extracts and Zinc   Assessment / Plan:     Visit Diagnoses: Seronegative rheumatoid arthritis ([place]) - U/S+ syonvitis Bilateral  wrists/left MCP, RF-, CCP-, 14-3-3 eta negative: She has no synovitis on exam.  She has not had any recent rheumatoid arthritis flares.  She is clinically doing well on Plaquenil 200 mg 1 tablet by mouth twice daily.  She continues to tolerate Plaquenil without any side effects and has not missed any doses recently.  She presents today with significant fatigue and generalized myofascial pain.  She has generalized hyperalgesia on examination today.  Overall her rheumatoid arthritis seems well controlled on the current treatment regimen.  She will continue taking Plaquenil as prescribed.  She does not need a refill at this time.  She was advised to notify us if she develops increased joint pain or joint swelling.    High risk medication use - Plaquenil 200 mg 1 tablet by mouth twice daily. PLQ eye exam: 01/18/2019. She has updated PLQ eye exam, so we will try to call to obtain those records. CBC and CMP drawn on 04/17/20.  She has recent lab work at her PCPs office so we will call  to obtain those records as well.   Myofascial pain: She presents today with generalized myalgias and muscle tenderness consistent with myofascial pain.  She has been having significant fatigue as well as generalized myalgias for the past 1.5 weeks.  Prior to the onset of her symptoms she was under tremendous amount of stress finishing up work for her classes.  She has been sleeping well at night taking trazodone 100 mg by mouth at bedtime.  She has been waking up feeling restored but within 2 hours her fatigue returns.  She has been having to take naps during the day and has noticed that her ADLs have been more difficult to complete.  She was evaluated by her PCP 2 days ago who updated lab work including vitamin D, vitamin B12, TSH, and CBC.  We will obtain these records.  We will also obtain some additional labs listed below.   We discussed the diagnosis of myofascial pain.  We discussed trying massage, heat, as well as water therapy.  We discussed the importance of regular exercise and good sleep hygiene.  We also discussed the importance of stress management. She was encouraged to talk with her psychiatrist about possibly switching from Prozac to Cymbalta to also help to alleviate some of her generalized pain.  She will continue to follow up with pain management as well.   Trapezius muscle spasm: She has trapezius muscle tension and muscle tenderness bilaterally.  She takes baclofen 10 mg 3 times daily as needed for muscle spasms. She may benefit from trigger point injections in the future.  Carpal tunnel syndrome, right upper limb: Asymptomatic at this time  Patellofemoral disorder of left knee: She has good ROM of the left knee joint on exam.  No warmth or effusion noted.  Chronic SI joint pain: She is not experiencing any increased SI joint discomfort at this time.  Spondylolysis, lumbar region: Chronic pain.  No symptoms of radiculopathy.   Chronic pain syndrome: She is taking  hydrocodone-acetaminophen 10-325 mg every 4 hours as needed for pain relief.   Other fatigue - She has been experiencing significant fatigue for the past 1.5 weeks.  She has been sleeping well at night (8-9 hours) and has been waking up feeling restored.  According to the patient within about 2 hours she starts to have increased fatigue and has had to take naps several days the past week.  She denies any recent infections.  She has been  under increased stress after completing work for class which may have exacerbated her symptoms.  She is also experiencing myalgias and muscle tenderness consistent with myofascial pain syndrome.  We discussed the possible diagnosis of fibromyalgia.  We discussed the importance of regular exercise and good sleep hygiene.  We will also obtain the following lab work today.  Plan: CK, Sedimentation rate, Anti-DNA antibody, double-stranded, C3 and C4, ANA  Positive ANA (antinuclear antibody) - 06/11/19: ANA 1:40 Nuclear, speckled, Lab work from 11/15/19: dsDNA negative, complements WNL, and ESR WNL.  Lab work from 04/17/2020 was reviewed today in the office: ANA negative, complements within normal limits, ESR within normal limits, double-stranded DNA negative: She does not have any clinical features of systemic lupus at this time and does not meet clinical criteria for the diagnosis of SLE.  She has been experiencing generalized myalgias, muscle tenderness, and fatigue for the past 1.5 weeks.  We will obtain the follow up lab work today to rule out an autoimmune etiology to her symptoms. - Plan: CK, Sedimentation rate, Anti-DNA antibody, double-stranded, C3 and C4, ANA  Vitamin D deficiency: According to the patient her PCP recently updated her vitamin D level and sent in a prescription for vitamin D 50,000 units once weekly.  She has not started on the prescription yet.  Vitamin B12 deficiency: Vitamin B12 level checked by PCP.  We will obtain these records.   History of iron  deficiency anemia: She continues to take ferrous sulfate 325 mg daily.   Other medical conditions are listed as follows:   Diabetic gastroparesis ([place])  History of asthma  History of depression  Type 1 diabetes mellitus with diabetic polyneuropathy ([place])  History of gastroesophageal reflux (GERD)  Family history of systemic lupus erythematosus  Orders: Orders Placed This Encounter  Procedures  . CK  . Sedimentation rate  . Anti-DNA antibody, double-stranded  . C3 and C4  . ANA   No orders of the defined types were placed in this encounter.   Follow-Up Instructions: Return for Rheumatoid arthritis.   Ofilia Neas, PA-C  Note - This record has been created using Dragon software.  Chart creation errors have been sought, but may not always  have been located. Such creation errors do not reflect on  the standard of medical care.

## 2020-06-17 ENCOUNTER — Ambulatory Visit: Payer: Medicaid Other | Admitting: Physician Assistant

## 2020-06-17 ENCOUNTER — Other Ambulatory Visit: Payer: Self-pay

## 2020-06-17 ENCOUNTER — Telehealth: Payer: Self-pay | Admitting: *Deleted

## 2020-06-17 ENCOUNTER — Encounter: Payer: Self-pay | Admitting: Physician Assistant

## 2020-06-17 VITALS — BP 155/95 | HR 93 | Resp 16 | Ht 64.0 in | Wt 281.4 lb

## 2020-06-17 DIAGNOSIS — G5601 Carpal tunnel syndrome, right upper limb: Secondary | ICD-10-CM

## 2020-06-17 DIAGNOSIS — M0600 Rheumatoid arthritis without rheumatoid factor, unspecified site: Secondary | ICD-10-CM

## 2020-06-17 DIAGNOSIS — Z8709 Personal history of other diseases of the respiratory system: Secondary | ICD-10-CM

## 2020-06-17 DIAGNOSIS — G894 Chronic pain syndrome: Secondary | ICD-10-CM

## 2020-06-17 DIAGNOSIS — E1042 Type 1 diabetes mellitus with diabetic polyneuropathy: Secondary | ICD-10-CM

## 2020-06-17 DIAGNOSIS — Z8269 Family history of other diseases of the musculoskeletal system and connective tissue: Secondary | ICD-10-CM

## 2020-06-17 DIAGNOSIS — M62838 Other muscle spasm: Secondary | ICD-10-CM | POA: Diagnosis not present

## 2020-06-17 DIAGNOSIS — Z79899 Other long term (current) drug therapy: Secondary | ICD-10-CM | POA: Diagnosis not present

## 2020-06-17 DIAGNOSIS — R5383 Other fatigue: Secondary | ICD-10-CM

## 2020-06-17 DIAGNOSIS — E538 Deficiency of other specified B group vitamins: Secondary | ICD-10-CM

## 2020-06-17 DIAGNOSIS — Z8719 Personal history of other diseases of the digestive system: Secondary | ICD-10-CM

## 2020-06-17 DIAGNOSIS — M4306 Spondylolysis, lumbar region: Secondary | ICD-10-CM

## 2020-06-17 DIAGNOSIS — E1143 Type 2 diabetes mellitus with diabetic autonomic (poly)neuropathy: Secondary | ICD-10-CM

## 2020-06-17 DIAGNOSIS — M7918 Myalgia, other site: Secondary | ICD-10-CM

## 2020-06-17 DIAGNOSIS — Z862 Personal history of diseases of the blood and blood-forming organs and certain disorders involving the immune mechanism: Secondary | ICD-10-CM

## 2020-06-17 DIAGNOSIS — R768 Other specified abnormal immunological findings in serum: Secondary | ICD-10-CM

## 2020-06-17 DIAGNOSIS — G8929 Other chronic pain: Secondary | ICD-10-CM

## 2020-06-17 DIAGNOSIS — K3184 Gastroparesis: Secondary | ICD-10-CM

## 2020-06-17 DIAGNOSIS — E559 Vitamin D deficiency, unspecified: Secondary | ICD-10-CM

## 2020-06-17 DIAGNOSIS — M222X2 Patellofemoral disorders, left knee: Secondary | ICD-10-CM

## 2020-06-17 DIAGNOSIS — Z8659 Personal history of other mental and behavioral disorders: Secondary | ICD-10-CM

## 2020-06-17 DIAGNOSIS — M533 Sacrococcygeal disorders, not elsewhere classified: Secondary | ICD-10-CM

## 2020-06-17 NOTE — Telephone Encounter (Signed)
Labs received from: [facility] Drawn on:05/19/2020 Reviewed by:Sherron Ales, PA-C  Labs drawn:CBC w/Diff, CMP, Hemoglobin A1C, Iron, Lipid Panel w/reflex, TSH, Vitamin B12, Vitamin D, Fructosamine, FOBT, Urine Routine   Results: RBC  4.17 HGB  10.7 HCT  33.0 MCV  79.2 MCH  25.6 A1C  8.8 Calc LDL 101 Vitamin D 16.5 Fructosamine 334

## 2020-06-18 LAB — C3 AND C4
C3 Complement: 128 mg/dL (ref 83–193)
C4 Complement: 36 mg/dL (ref 15–57)

## 2020-06-18 LAB — SEDIMENTATION RATE: Sed Rate: 17 mm/h (ref 0–20)

## 2020-06-18 LAB — ANTI-DNA ANTIBODY, DOUBLE-STRANDED: ds DNA Ab: <1 IU/mL

## 2020-06-18 LAB — CK: Total CK: 94 U/L (ref 29–143)

## 2020-06-18 LAB — ANA: Anti Nuclear Antibody (ANA): NEGATIVE

## 2020-06-18 NOTE — Progress Notes (Signed)
CK and ESR WNL.  Complements WNL.

## 2020-06-19 NOTE — Progress Notes (Signed)
ANA negative.  dsDNA negative.   It is unlikely that her fatigue is due to underlying autoimmune disease.    Recommend starting on the vitamin D supplement prescribed by her PCP and continuing to take ferrous sulfate 325 mg daily.

## 2020-07-23 ENCOUNTER — Emergency Department (HOSPITAL_BASED_OUTPATIENT_CLINIC_OR_DEPARTMENT_OTHER): Payer: Medicaid Other

## 2020-07-23 ENCOUNTER — Other Ambulatory Visit: Payer: Self-pay

## 2020-07-23 ENCOUNTER — Emergency Department (HOSPITAL_BASED_OUTPATIENT_CLINIC_OR_DEPARTMENT_OTHER)
Admission: EM | Admit: 2020-07-23 | Discharge: 2020-07-23 | Disposition: A | Discharge status: Home / Self Care | Payer: Medicaid Other | Attending: Emergency Medicine | Admitting: Emergency Medicine

## 2020-07-23 ENCOUNTER — Encounter (HOSPITAL_BASED_OUTPATIENT_CLINIC_OR_DEPARTMENT_OTHER): Payer: Self-pay | Admitting: Obstetrics and Gynecology

## 2020-07-23 DIAGNOSIS — Z79899 Other long term (current) drug therapy: Secondary | ICD-10-CM | POA: Insufficient documentation

## 2020-07-23 DIAGNOSIS — K769 Liver disease, unspecified: Secondary | ICD-10-CM | POA: Diagnosis not present

## 2020-07-23 DIAGNOSIS — R1013 Epigastric pain: Secondary | ICD-10-CM

## 2020-07-23 DIAGNOSIS — E1140 Type 2 diabetes mellitus with diabetic neuropathy, unspecified: Secondary | ICD-10-CM | POA: Diagnosis not present

## 2020-07-23 DIAGNOSIS — Z794 Long term (current) use of insulin: Secondary | ICD-10-CM | POA: Diagnosis not present

## 2020-07-23 DIAGNOSIS — J45909 Unspecified asthma, uncomplicated: Secondary | ICD-10-CM | POA: Insufficient documentation

## 2020-07-23 DIAGNOSIS — N289 Disorder of kidney and ureter, unspecified: Secondary | ICD-10-CM | POA: Insufficient documentation

## 2020-07-23 DIAGNOSIS — I10 Essential (primary) hypertension: Secondary | ICD-10-CM | POA: Insufficient documentation

## 2020-07-23 LAB — COMPREHENSIVE METABOLIC PANEL
ALT: 8 U/L (ref 0–44)
AST: 10 U/L — ABNORMAL LOW (ref 15–41)
Albumin: 3.7 g/dL (ref 3.5–5.0)
Alkaline Phosphatase: 55 U/L (ref 38–126)
Anion gap: 9 (ref 5–15)
BUN: 11 mg/dL (ref 6–20)
CO2: 25 mmol/L (ref 22–32)
Calcium: 8.5 mg/dL — ABNORMAL LOW (ref 8.9–10.3)
Chloride: 101 mmol/L (ref 98–111)
Creatinine, Ser: 0.93 mg/dL (ref 0.44–1.00)
GFR, Estimated: >60 mL/min (ref >60)
Glucose, Bld: 247 mg/dL — ABNORMAL HIGH (ref 70–99)
Potassium: 3.7 mmol/L (ref 3.5–5.1)
Sodium: 135 mmol/L (ref 135–145)
Total Bilirubin: 0.5 mg/dL (ref 0.3–1.2)
Total Protein: 7.4 g/dL (ref 6.5–8.1)

## 2020-07-23 LAB — CBC
HCT: 32.2 % — ABNORMAL LOW (ref 36.0–46.0)
Hemoglobin: 10.3 g/dL — ABNORMAL LOW (ref 12.0–15.0)
MCH: 26.1 pg (ref 26.0–34.0)
MCHC: 32 g/dL (ref 30.0–36.0)
MCV: 81.5 fL (ref 80.0–100.0)
Platelets: 212 10*3/uL (ref 150–400)
RBC: 3.95 MIL/uL (ref 3.87–5.11)
RDW: 13.4 % (ref 11.5–15.5)
WBC: 10.2 10*3/uL (ref 4.0–10.5)
nRBC: 0 % (ref 0.0–0.2)

## 2020-07-23 LAB — URINALYSIS, ROUTINE W REFLEX MICROSCOPIC
Bilirubin Urine: NEGATIVE
Glucose, UA: 500 mg/dL — AB
Hgb urine dipstick: NEGATIVE
Ketones, ur: NEGATIVE mg/dL
Nitrite: NEGATIVE
Protein, ur: 30 mg/dL — AB
Specific Gravity, Urine: 1.016 (ref 1.005–1.030)
pH: 6 (ref 5.0–8.0)

## 2020-07-23 LAB — PREGNANCY, URINE: Preg Test, Ur: NEGATIVE

## 2020-07-23 LAB — LIPASE, BLOOD: Lipase: <10 U/L — ABNORMAL LOW (ref 11–51)

## 2020-07-23 MED ORDER — HYDROMORPHONE HCL 1 MG/ML IJ SOLN
1.0000 mg | Freq: Once | INTRAMUSCULAR | Status: AC
  Administered 2020-07-23: 1 mg via INTRAVENOUS
  Filled 2020-07-23: qty 1

## 2020-07-23 MED ORDER — MORPHINE SULFATE (PF) 4 MG/ML IV SOLN
4.0000 mg | Freq: Once | INTRAVENOUS | Status: AC | PRN
  Administered 2020-07-23: 4 mg via INTRAVENOUS
  Filled 2020-07-23: qty 1

## 2020-07-23 MED ORDER — CEPHALEXIN 500 MG PO CAPS: 500.0000 mg | ORAL_CAPSULE | Freq: Two times a day (BID) | ORAL | 0 refills | Status: DC

## 2020-07-23 MED ORDER — SODIUM CHLORIDE 0.9 % IV SOLN
1.0000 g | Freq: Once | INTRAVENOUS | Status: AC
  Administered 2020-07-23: 1 g via INTRAVENOUS
  Filled 2020-07-23: qty 10

## 2020-07-23 MED ORDER — IOHEXOL 300 MG/ML  SOLN
100.0000 mL | Freq: Once | INTRAMUSCULAR | Status: AC | PRN
  Administered 2020-07-23: 100 mL via INTRAVENOUS

## 2020-07-23 MED ORDER — OXYCODONE HCL 5 MG PO TABS: 5.0000 mg | ORAL_TABLET | Freq: Four times a day (QID) | ORAL | 0 refills | Status: DC | PRN

## 2020-07-23 MED ORDER — ONDANSETRON 4 MG PO TBDP: 4.0000 mg | ORAL_TABLET | Freq: Three times a day (TID) | ORAL | 0 refills | Status: AC | PRN

## 2020-07-23 NOTE — ED Provider Notes (Signed)
[facility] EMERGENCY DEPT Provider Note   CSN: [id number] Arrival date & time: 07/23/20  1714     History Chief Complaint  Patient presents with  . Abdominal Pain    [name] is a 30 y.o. female history of obesity status post gastric sleeve surgery, diabetes, insulin-dependent, presented emerged part with abdominal pain nausea and vomiting.  She reports onset yesterday.  She said persistent pain in his epigastrium right upper quadrant since then.  She feels nauseated.  She has never had this pain before.  She had a very small bowel movement yesterday, reports a history of chronic constipation.  She denies any dysuria, hematuria.  She denies any fevers or chills.  She denies any other abdominal surgeries aside from her gastric sleeve.  She is not currently in her menstrual cycle.  She denies any vaginal pain or discharge.  She is here with her mother.  HPI     Past Medical History:  Diagnosis Date  . Anemia   . Arthritis   . Asthma   . Diabetes mellitus without complication (HCC)   . Hypertension   . Vitamin D deficiency     Patient Active Problem List   Diagnosis Date Noted  . Chronic low back pain 11/22/2013  . Clinical depression 10/07/2013  . Extreme obesity 10/07/2013  . Diabetic neuropathy (HCC) 08/06/2013  . General patient noncompliance 12/21/2012  . 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 Appetite disorder 12/30/2010  . BP (high blood pressure) 12/30/2010  . Muscle ache 12/30/2010  . Cachectic (HCC) 12/30/2010  . 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 Diabetes mellitus 09/03/2010    Past Surgical History:  Procedure Laterality Date  . GASTRIC BYPASS  07/2017     OB History   No obstetric history on file.     Family History  Problem Relation Age of Onset  . Lupus Maternal Grandmother   . Asthma Sister   . ADD / ADHD Sister     Social History   Tobacco Use  . Smoking status: Never Smoker  . Smokeless tobacco: Never  Used  Vaping Use  . Vaping Use: Never used  Substance Use Topics  . Alcohol use: Yes    Comment: occ  . Drug use: Never    Home Medications Prior to Admission medications   Medication Sig Start Date End Date Taking? Authorizing Provider  cephALEXin (KEFLEX) 500 MG capsule Take 1 capsule (500 mg total) by mouth 2 (two) times daily for 9 days. 07/24/20 08/02/20 Yes Marisel Tostenson, Kermit Balo, MD  oxyCODONE (ROXICODONE) 5 MG immediate release tablet Take 1 tablet (5 mg total) by mouth every 6 (six) hours as needed for up to 15 doses for severe pain. 07/23/20  Yes Kolina Kube, Kermit Balo, MD  albuterol (PROVENTIL HFA;VENTOLIN HFA) 108 (90 Base) MCG/ACT inhaler Inhale 2 puffs into the lungs every 4 (four) hours as needed for wheezing.    [provider]  baclofen (LIORESAL) 10 MG tablet Take 10 mg by mouth 3 (three) times daily as needed. 07/12/19   [provider]  ferrous sulfate 325 (65 FE) MG tablet Take 325 mg by mouth daily.    [provider]  FLUoxetine (PROZAC) 40 MG capsule Take 40 mg by mouth every morning. 03/07/20   [provider]  HUMALOG 100 UNIT/ML injection Inject 100 Units as directed See admin instructions. Inject up to 100 units per day via insulin pump as directed 02/06/18   [provider]  HYDROcodone-acetaminophen [facility])  10-325 MG tablet Take 1 tablet by mouth every 4 (four) hours as needed for pain.    [provider]  hydroxychloroquine (PLAQUENIL) 200 MG tablet TAKE 1 TABLET(200 MG) BY MOUTH TWICE DAILY 04/17/20   Gearldine Bienenstock, PA-C  lisinopril (ZESTRIL) 10 MG tablet Take 20 mg by mouth daily. 05/28/19   [provider]  omeprazole (PRILOSEC) 40 MG capsule Take 40 mg by mouth as needed. Patient not taking: Reported on 06/17/2020 11/14/19   [provider]  prazosin (MINIPRESS) 2 MG capsule Take 2 mg by mouth at bedtime. 03/06/20   [provider]  traZODone (DESYREL) 50 MG tablet Take 100 mg by mouth at bedtime.  03/06/20   [provider]  triamcinolone cream (KENALOG) 0.1 % Apply 1 application topically as needed. Apply 1-2 times a day to affected areas of dry itchy skin on the body until clear, then as needed. Not to face. 09/08/16   [provider]    Allergies    Fruit extracts and Zinc  Review of Systems   Review of Systems  Constitutional: Negative for chills and fever.  HENT: Negative for ear pain and sore throat.   Eyes: Negative for pain and visual disturbance.  Respiratory: Negative for cough and shortness of breath.   Cardiovascular: Negative for chest pain and palpitations.  Gastrointestinal: Positive for abdominal pain and nausea. Negative for vomiting.  Genitourinary: Negative for dysuria and hematuria.  Musculoskeletal: Negative for arthralgias and back pain.  Skin: Negative for color change and rash.  Neurological: Negative for seizures and syncope.  All other systems reviewed and are negative.   Physical Exam Updated Vital Signs BP (!) 142/86 (BP Location: Right Wrist)   Pulse (!) 102   Temp 99 F (37.2 C) (Oral)   Resp 20   Ht 5' 3.75" (1.619 m)   Wt 124.3 kg   LMP 07/03/2020 (Exact Date)   SpO2 100%   BMI 47.40 kg/m   Physical Exam Constitutional:      General: She is not in acute distress.    Appearance: She is obese.  HENT:     Head: Normocephalic and atraumatic.  Eyes:     Conjunctiva/sclera: Conjunctivae normal.     Pupils: Pupils are equal, round, and reactive to light.  Cardiovascular:     Rate and Rhythm: Normal rate and regular rhythm.  Pulmonary:     Effort: Pulmonary effort is normal. No respiratory distress.  Abdominal:     General: There is no distension.     Tenderness: There is abdominal tenderness in the right upper quadrant and epigastric area. Positive signs include Murphy's sign. Negative signs include McBurney's sign.  Skin:    General: Skin is warm and dry.  Neurological:     General: No focal deficit present.      Mental Status: She is alert. Mental status is at baseline.  Psychiatric:        Mood and Affect: Mood normal.        Behavior: Behavior normal.     ED Results / Procedures / Treatments   Labs (all labs ordered are listed, but only abnormal results are displayed) Labs Reviewed  LIPASE, BLOOD - Abnormal; Notable for the following components:      Result Value   Lipase <10 (*)    All other components within normal limits  COMPREHENSIVE METABOLIC PANEL - Abnormal; Notable for the following components:   Glucose, Bld 247 (*)    Calcium 8.5 (*)  AST 10 (*)    All other components within normal limits  CBC - Abnormal; Notable for the following components:   Hemoglobin 10.3 (*)    HCT 32.2 (*)    All other components within normal limits  URINALYSIS, ROUTINE W REFLEX MICROSCOPIC - Abnormal; Notable for the following components:   Glucose, UA 500 (*)    Protein, ur 30 (*)    Leukocytes,Ua TRACE (*)    All other components within normal limits  URINE CULTURE  PREGNANCY, URINE    EKG None  Radiology CT ABDOMEN PELVIS W CONTRAST  Result Date: 07/23/2020 CLINICAL DATA:  Right upper quadrant pain with nausea EXAM: CT ABDOMEN AND PELVIS WITH CONTRAST TECHNIQUE: Multidetector CT imaging of the abdomen and pelvis was performed using the standard protocol following bolus administration of intravenous contrast. CONTRAST:  OMNIPAQUE IOHEXOL 300 MG/ML  SOLN COMPARISON:  None. FINDINGS: Lower chest: No acute abnormality. Hepatobiliary: Focal hypodensity at the falciform ligament, possible focal fat infiltration though somewhat masslike configuration. Pancreas: Unremarkable. No pancreatic ductal dilatation or surrounding inflammatory changes. Spleen: Normal in size without focal abnormality. Adrenals/Urinary Tract: Left adrenal gland is normal. 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 Focal hypodensity within the upper pole right kidney with mild perinephric  stranding. No hydronephrosis. The bladder is normal Stomach/Bowel: Postsurgical changes of the stomach. Appendix appears normal. No evidence of bowel wall thickening, distention, or inflammatory changes. Vascular/Lymphatic: No significant vascular findings are present. No enlarged abdominal or pelvic lymph nodes. Reproductive: Uterus and bilateral adnexa are unremarkable. Other: Negative for free air or free fluid. Musculoskeletal: No acute or significant osseous findings. IMPRESSION: 1. Focal ill-defined hypodensity within the upper pole right kidney with mild perinephric fluid and edema, raises possibility of pyelonephritis, suggest correlation with urinalysis. 2. Focal hypodensity near falciform ligament probably represents focal fat infiltration though slightly masslike configuration. When the patient is clinically stable and able to follow directions and hold their breath (preferably as an outpatient) further evaluation with dedicated abdominal MRI should be considered. Electronically Signed   By: Jasmine Pang M.D.   On: 07/23/2020 21:26   US Abdomen Limited  Result Date: 07/23/2020 CLINICAL DATA:  Right upper quadrant pain. EXAM: ULTRASOUND ABDOMEN LIMITED RIGHT UPPER QUADRANT COMPARISON:  None. FINDINGS: Gallbladder: No gallstones or wall thickening visualized (2.5 mm). No positive sonographic Murphy sign noted by sonographer. Common bile duct: Diameter: 3.6 mm Liver: No focal lesion identified. Within normal limits in parenchymal echogenicity. Portal vein is patent on color Doppler imaging with normal direction of blood flow towards the liver. Other: None. IMPRESSION: Positive sonographic Murphy sign in the absence of cholelithiasis. Correlation with a nuclear medicine hepatobiliary scan is recommended, as early acalculous cholecystitis cannot completely be excluded. Electronically Signed   By: Aram Candela M.D.   On: 07/23/2020 19:45    Procedures Procedures   Medications Ordered in  ED Medications  morphine 4 MG/ML injection 4 mg (4 mg Intravenous Given 07/23/20 1812)  iohexol (OMNIPAQUE) 300 MG/ML solution 100 mL (100 mLs Intravenous Contrast Given 07/23/20 2041)  HYDROmorphone (DILAUDID) injection 1 mg (1 mg Intravenous Given 07/23/20 2219)  cefTRIAXone (ROCEPHIN) 1 g in sodium chloride 0.9 % 100 mL IVPB (0 g Intravenous Stopped 07/23/20 2300)    ED Course  I have reviewed the triage vital signs and the nursing notes.  Pertinent labs & imaging results that were available during my care of the patient were reviewed by me and considered in my medical decision  making (see chart for details).  This patient presents to the Emergency Department with complaint of abdominal pain. This involves an extensive number of treatment options, and is a complaint that carries with it a high risk of complications and morbidity.  The differential diagnosis includes, but is not limited to biliary disease vs liver disease vs kidney disease vs colitis vs fibromyalgia vs other  I ordered, reviewed, and interpreted labs, including CMP and CBC.  There were no immediate, life-threatening emergencies found in this labwork.  Patient's UA showed trace leuks, no nitrites.  Lipase normal.  LFT's normal I ordered medication morphine, dilaudid, rocephin for abdominal pain and kidney infection I ordered imaging studies which included RUQ ultrasound, CT abdomen /pelvis with contrast I independently visualized and interpreted imaging which showed no acute biliary disease noted on RUQ ultrasound (and on CT abdomen, as I confirmed with radiologist), and the monitor tracing which showed NSR.  CT scan showing stranding around the right pole of the kidney.  Also nonspecific lesion of the liver, which is less likely to be cause of acute pain.    There is no lower pelvic pain per the patient's history.  No evidence of free fluid in the pelvis to suggest ovarian cyst rupture, no ovarian enlargement to suggest torsion.  I  have a lower suspicion for pelvic pathology at this time.  After the interventions stated above, I reevaluated the patient and found that they remained clinically stable.  Based on the patient's clinical exam, vital signs, risk factors, and ED testing, I felt that the patient's overall risk of life-threatening emergency such as bowel perforation, surgical emergency, or sepsis was quite low.  I suspect this clinical presentation is most consistent with pyelonephritis, but explained to the patient that this evaluation was not a definitive diagnostic workup.  I discussed outpatient follow up with primary care provider, and provided specialist office number on the patient's discharge paper if a referral was deemed necessary.  I discussed return precautions with the patient. I felt the patient was clinically stable for discharge.   Clinical Course as of 07/23/20 2317  Thu Jul 23, 2020  2216 CT reviewed - nonspecific falciform liver lesion and right kidney lesion with stranding - UA without clear evidence of pyelonephritis, but I think it's reasonable to treat presumptively for a possible kidney infection, given the correlation with her pain. [MT]    Clinical Course User Index [MT] Renaye Rakers Kermit Balo, MD   Patient and mother updated regarding plan for antibiotics, PCP follow up next week for Urine cx f/u, discussion about liver MRI for nonspecific lesion.   Final Clinical Impression(s) / ED Diagnoses Final diagnoses:  Liver lesion  Epigastric pain  Kidney lesion    Rx / DC Orders ED Discharge Orders         Ordered    cephALEXin (KEFLEX) 500 MG capsule  2 times daily        07/23/20 2304    oxyCODONE (ROXICODONE) 5 MG immediate release tablet  Every 6 hours PRN        07/23/20 2304           Terald Sleeper, MD 07/23/20 2318

## 2020-07-23 NOTE — ED Triage Notes (Signed)
Patient reports to the ER for abdominal pain and nausea. Patient was sent by [facility] for a CT.  Patient reports the pain started yesterday

## 2020-07-23 NOTE — ED Notes (Signed)
This RN presented the AVS utilizing Teachback Method. Patient verbalizes understanding of Discharge Instructions. Opportunity for Questioning and Answers were provided. Patient Discharged from ED ambulatory to Home.   

## 2020-07-23 NOTE — Discharge Instructions (Signed)
You were seen in the emergency department for abdominal pain.  You had blood work, an ultrasound, and a CT scan of your abdomen during your stay.  Your CT scan shows some inflammation in your right kidney.  We discussed the possibility of a kidney infection.  It is not clear at this time whether this is a true kidney infection.  We will send a urine culture for testing, but this will take 2 days or more before results come.  You should schedule follow-up appointment with your primary care doctor early next week to follow-up on these results.  In the meantime, please begin taking Keflex, the antibiotic I prescribed for kidney infection.  Your CT scan also showed a nonspecific lesion, or nodule, on your liver.  I recommend that your doctor get a dedicated MRI of the liver with contrast as an outpatient to better look at this lesion.  This can be done non-emergently as an outpatient.  I do not think that this is the cause of your pain.  If your pain becomes more severe, cannot tolerate that at home, or began having vomiting, high fevers, difficulty breathing, or any other concerning symptoms, you should return to the emergency department.

## 2020-07-23 NOTE — ED Notes (Addendum)
Patient transported to CT at this time. 

## 2020-07-24 ENCOUNTER — Telehealth: Payer: Self-pay

## 2020-07-24 NOTE — Telephone Encounter (Signed)
Patient called stating she had labwork at the ER yesterday 07/23/20 and was told that one of her kidneys is inflamed without any real cause.  Patient requested a return call.

## 2020-07-25 ENCOUNTER — Inpatient Hospital Stay (HOSPITAL_BASED_OUTPATIENT_CLINIC_OR_DEPARTMENT_OTHER)
Admission: EM | Admit: 2020-07-25 | Discharge: 2020-07-27 | DRG: 872 | Disposition: A | Discharge status: Home / Self Care | Payer: Medicaid Other | Attending: Internal Medicine | Admitting: Internal Medicine

## 2020-07-25 ENCOUNTER — Encounter (HOSPITAL_BASED_OUTPATIENT_CLINIC_OR_DEPARTMENT_OTHER): Payer: Self-pay | Admitting: Emergency Medicine

## 2020-07-25 ENCOUNTER — Other Ambulatory Visit: Payer: Self-pay

## 2020-07-25 DIAGNOSIS — Z20822 Contact with and (suspected) exposure to covid-19: Secondary | ICD-10-CM | POA: Diagnosis present

## 2020-07-25 DIAGNOSIS — I10 Essential (primary) hypertension: Secondary | ICD-10-CM

## 2020-07-25 DIAGNOSIS — N10 Acute tubulo-interstitial nephritis: Secondary | ICD-10-CM

## 2020-07-25 DIAGNOSIS — A419 Sepsis, unspecified organism: Principal | ICD-10-CM | POA: Diagnosis present

## 2020-07-25 DIAGNOSIS — N12 Tubulo-interstitial nephritis, not specified as acute or chronic: Secondary | ICD-10-CM

## 2020-07-25 DIAGNOSIS — M1990 Unspecified osteoarthritis, unspecified site: Secondary | ICD-10-CM | POA: Diagnosis present

## 2020-07-25 DIAGNOSIS — R1011 Right upper quadrant pain: Secondary | ICD-10-CM | POA: Diagnosis present

## 2020-07-25 DIAGNOSIS — Z79899 Other long term (current) drug therapy: Secondary | ICD-10-CM

## 2020-07-25 DIAGNOSIS — E108 Type 1 diabetes mellitus with unspecified complications: Secondary | ICD-10-CM

## 2020-07-25 DIAGNOSIS — B952 Enterococcus as the cause of diseases classified elsewhere: Secondary | ICD-10-CM

## 2020-07-25 DIAGNOSIS — Z825 Family history of asthma and other chronic lower respiratory diseases: Secondary | ICD-10-CM

## 2020-07-25 DIAGNOSIS — Z9641 Presence of insulin pump (external) (internal): Secondary | ICD-10-CM | POA: Diagnosis present

## 2020-07-25 DIAGNOSIS — E1040 Type 1 diabetes mellitus with diabetic neuropathy, unspecified: Secondary | ICD-10-CM | POA: Diagnosis present

## 2020-07-25 DIAGNOSIS — Z9884 Bariatric surgery status: Secondary | ICD-10-CM

## 2020-07-25 DIAGNOSIS — Z91018 Allergy to other foods: Secondary | ICD-10-CM

## 2020-07-25 DIAGNOSIS — Z794 Long term (current) use of insulin: Secondary | ICD-10-CM

## 2020-07-25 DIAGNOSIS — Z6841 Body Mass Index (BMI) 40.0 and over, adult: Secondary | ICD-10-CM

## 2020-07-25 DIAGNOSIS — J4520 Mild intermittent asthma, uncomplicated: Secondary | ICD-10-CM | POA: Diagnosis present

## 2020-07-25 DIAGNOSIS — E10649 Type 1 diabetes mellitus with hypoglycemia without coma: Secondary | ICD-10-CM | POA: Diagnosis not present

## 2020-07-25 DIAGNOSIS — Z91048 Other nonmedicinal substance allergy status: Secondary | ICD-10-CM

## 2020-07-25 LAB — CBC WITH DIFFERENTIAL/PLATELET
Abs Immature Granulocytes: 0.03 10*3/uL (ref 0.00–0.07)
Basophils Absolute: 0 10*3/uL (ref 0.0–0.1)
Basophils Relative: 0 %
Eosinophils Absolute: 0.1 10*3/uL (ref 0.0–0.5)
Eosinophils Relative: 1 %
HCT: 29.5 % — ABNORMAL LOW (ref 36.0–46.0)
Hemoglobin: 9.4 g/dL — ABNORMAL LOW (ref 12.0–15.0)
Immature Granulocytes: 0 %
Lymphocytes Relative: 31 %
Lymphs Abs: 2.4 10*3/uL (ref 0.7–4.0)
MCH: 26.1 pg (ref 26.0–34.0)
MCHC: 31.9 g/dL (ref 30.0–36.0)
MCV: 81.9 fL (ref 80.0–100.0)
Monocytes Absolute: 0.8 10*3/uL (ref 0.1–1.0)
Monocytes Relative: 10 %
Neutro Abs: 4.6 10*3/uL (ref 1.7–7.7)
Neutrophils Relative %: 58 %
Platelets: 205 10*3/uL (ref 150–400)
RBC: 3.6 MIL/uL — ABNORMAL LOW (ref 3.87–5.11)
RDW: 13.1 % (ref 11.5–15.5)
WBC: 7.9 10*3/uL (ref 4.0–10.5)
nRBC: 0 % (ref 0.0–0.2)

## 2020-07-25 LAB — COMPREHENSIVE METABOLIC PANEL
ALT: 12 U/L (ref 0–44)
AST: 11 U/L — ABNORMAL LOW (ref 15–41)
Albumin: 3 g/dL — ABNORMAL LOW (ref 3.5–5.0)
Alkaline Phosphatase: 48 U/L (ref 38–126)
Anion gap: 8 (ref 5–15)
BUN: 14 mg/dL (ref 6–20)
CO2: 26 mmol/L (ref 22–32)
Calcium: 8.1 mg/dL — ABNORMAL LOW (ref 8.9–10.3)
Chloride: 101 mmol/L (ref 98–111)
Creatinine, Ser: 0.84 mg/dL (ref 0.44–1.00)
GFR, Estimated: >60 mL/min (ref >60)
Glucose, Bld: 124 mg/dL — ABNORMAL HIGH (ref 70–99)
Potassium: 3.8 mmol/L (ref 3.5–5.1)
Sodium: 135 mmol/L (ref 135–145)
Total Bilirubin: 0.3 mg/dL (ref 0.3–1.2)
Total Protein: 6.8 g/dL (ref 6.5–8.1)

## 2020-07-25 LAB — LIPASE, BLOOD: Lipase: 19 U/L (ref 11–51)

## 2020-07-25 LAB — PREGNANCY, URINE: Preg Test, Ur: NEGATIVE

## 2020-07-25 MED ORDER — METOCLOPRAMIDE HCL 5 MG/ML IJ SOLN
10.0000 mg | Freq: Once | INTRAMUSCULAR | Status: AC
  Administered 2020-07-25: 10 mg via INTRAVENOUS
  Filled 2020-07-25: qty 2

## 2020-07-25 MED ORDER — MORPHINE SULFATE (PF) 4 MG/ML IV SOLN
4.0000 mg | Freq: Once | INTRAVENOUS | Status: AC
  Administered 2020-07-25: 4 mg via INTRAVENOUS
  Filled 2020-07-25: qty 1

## 2020-07-25 NOTE — ED Triage Notes (Signed)
Seen at [facility] two days ago for the same.  Reports RUQ abdominal pain that radiates into her back.  Had CT and [name] and didn't find anything.  Was told to follow up for MRI.  Reports pain still not controlled with pain meds and antibiotics.

## 2020-07-25 NOTE — ED Provider Notes (Signed)
[facility] EMERGENCY DEPARTMENT Provider Note   CSN: [id number] Arrival date & time: 07/25/20  2152     History Chief Complaint  Patient presents with  . Abdominal Pain    [name] is a 30 y.o. female with a history of diabetes mellitus, anemia, hypertension, obesity.  Patient presents with a chief plaint of right upper quadrant abdominal pain.  Patient reports that her symptoms began 4 days prior.  Patient was seen at [facility] on 5/5 for the same complaint.  Patient had extensive work-up including CT abdomen pelvis as well as right upper quadrant ultrasound.  CT scan showed a defined hypodensity within the upper pole of the right kidney with mild perinephric fluid and edema.  Patient was started on Keflex 500 mg twice daily for possible pyelonephritis.  Right upper quadrant ultrasound showed positive sonographic Murphy sign in the absence of cholelithiasis.  HIDA scan was recommended to evaluate for early acalculous cholecystitis.  Patient presents emergency department today for worsening right upper quadrant abdominal pain.  Patient reports that she has had no relief in symptoms with prescribed Percocet medication.  Pain radiates to patient's back.  Patient rates her pain 8/10 on pain scale at present.  Pain is worse with movement.  Patient has no change in pain with eating food.  Patient endorses associated nausea.  Patient has not vomited in the last 24 hours.  Nausea has caused decreased oral intake.  Patient receives minimal relief with Zofran.  Patient endorses right flank pain and constipation.  Patient denies fever, chills, abdominal distention, blood in stool, melena, urinary symptoms, vaginal bleeding, vaginal discharge, vaginal pain, pelvic pain.    LMP 4/23.  Patient denies previous pregnancies.  Patient endorses history of gastric bypass performed in 2019.  HPI     Past Medical History:  Diagnosis Date  . Anemia   . Arthritis   . Asthma   . Diabetes  mellitus without complication (HCC)   . Hypertension   . Vitamin D deficiency     Patient Active Problem List   Diagnosis Date Noted  . Chronic low back pain 11/22/2013  . Clinical depression 10/07/2013  . Extreme obesity 10/07/2013  . Diabetic neuropathy (HCC) 08/06/2013  . General patient noncompliance 12/21/2012  . 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 Appetite disorder 12/30/2010  . BP (high blood pressure) 12/30/2010  . Muscle ache 12/30/2010  . Cachectic (HCC) 12/30/2010  . 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 Diabetes mellitus 09/03/2010    Past Surgical History:  Procedure Laterality Date  . GASTRIC BYPASS  07/2017     OB History   No obstetric history on file.     Family History  Problem Relation Age of Onset  . Lupus Maternal Grandmother   . Asthma Sister   . ADD / ADHD Sister     Social History   Tobacco Use  . Smoking status: Never Smoker  . Smokeless tobacco: Never Used  Vaping Use  . Vaping Use: Never used  Substance Use Topics  . Alcohol use: Yes    Comment: occ  . Drug use: Never    Home Medications Prior to Admission medications   Medication Sig Start Date End Date Taking? Authorizing Provider  albuterol (PROVENTIL HFA;VENTOLIN HFA) 108 (90 Base) MCG/ACT inhaler Inhale 2 puffs into the lungs every 4 (four) hours as needed for wheezing.    [provider]  baclofen (LIORESAL) 10 MG tablet Take 10 mg by mouth 3 (three) times  daily as needed. 07/12/19   [provider]  cephALEXin (KEFLEX) 500 MG capsule Take 1 capsule (500 mg total) by mouth 2 (two) times daily for 9 days. 07/24/20 08/02/20  Terald Sleeper, MD  ferrous sulfate 325 (65 FE) MG tablet Take 325 mg by mouth daily.    [provider]  FLUoxetine (PROZAC) 40 MG capsule Take 40 mg by mouth every morning. 03/07/20   [provider]  HUMALOG 100 UNIT/ML injection Inject 100 Units as directed See admin instructions. Inject up to 100 units  per day via insulin pump as directed 02/06/18   [provider]  HYDROcodone-acetaminophen (NORCO) 10-325 MG tablet Take 1 tablet by mouth every 4 (four) hours as needed for pain.    [provider]  hydroxychloroquine (PLAQUENIL) 200 MG tablet TAKE 1 TABLET(200 MG) BY MOUTH TWICE DAILY 04/17/20   Gearldine Bienenstock, PA-C  lisinopril (ZESTRIL) 10 MG tablet Take 20 mg by mouth daily. 05/28/19   [provider]  omeprazole (PRILOSEC) 40 MG capsule Take 40 mg by mouth as needed. Patient not taking: Reported on 06/17/2020 11/14/19   [provider]  ondansetron (ZOFRAN ODT) 4 MG disintegrating tablet Take 1 tablet (4 mg total) by mouth every 8 (eight) hours as needed for up to 15 doses for nausea or vomiting. 07/23/20   Trifan, Kermit Balo, MD  oxyCODONE (ROXICODONE) 5 MG immediate release tablet Take 1 tablet (5 mg total) by mouth every 6 (six) hours as needed for up to 15 doses for severe pain. 07/23/20   Terald Sleeper, MD  prazosin (MINIPRESS) 2 MG capsule Take 2 mg by mouth at bedtime. 03/06/20   [provider]  traZODone (DESYREL) 50 MG tablet Take 100 mg by mouth at bedtime. 03/06/20   [provider]  triamcinolone cream (KENALOG) 0.1 % Apply 1 application topically as needed. Apply 1-2 times a day to affected areas of dry itchy skin on the body until clear, then as needed. Not to face. 09/08/16   [provider]    Allergies    Fruit extracts and Zinc  Review of Systems   Review of Systems  Constitutional: Negative for chills and fever.  Eyes: Negative for visual disturbance.  Respiratory: Negative for shortness of breath.   Cardiovascular: Negative for chest pain.  Gastrointestinal: Positive for abdominal pain and nausea. Negative for abdominal distention, anal bleeding, blood in stool, constipation, diarrhea, rectal pain and vomiting.  Genitourinary: Positive for flank pain. Negative for decreased urine volume, difficulty urinating,  dysuria, frequency, genital sores, hematuria, menstrual problem, pelvic pain, urgency, vaginal bleeding, vaginal discharge and vaginal pain.  Musculoskeletal: Negative for back pain and neck pain.  Skin: Negative for color change and rash.  Neurological: Negative for dizziness, syncope, light-headedness and headaches.  Psychiatric/Behavioral: Negative for confusion.    Physical Exam Updated Vital Signs BP (!) 145/82 (BP Location: Right Arm)   Pulse 95   Temp 99.3 F (37.4 C) (Oral)   Resp 18   Ht 5' 3.75" (1.619 m)   Wt 124.3 kg   LMP 07/03/2020 (Exact Date) Comment: neg preg test  SpO2 100%   BMI 47.40 kg/m   Physical Exam Vitals and nursing note reviewed.  Constitutional:      General: She is not in acute distress.    Appearance: She is not ill-appearing, toxic-appearing or diaphoretic.     Comments: Uncomfortable due to complaints of pain  HENT:     Head: Normocephalic.  Eyes:  General: No scleral icterus.       Right eye: No discharge.        Left eye: No discharge.  Cardiovascular:     Rate and Rhythm: Normal rate.     Heart sounds: Normal heart sounds.  Pulmonary:     Effort: Pulmonary effort is normal. No tachypnea, bradypnea or respiratory distress.     Breath sounds: Normal breath sounds. No stridor.  Abdominal:     General: Bowel sounds are normal. There is no distension. There are no signs of injury.     Palpations: Abdomen is soft. There is no mass or pulsatile mass.     Tenderness: There is abdominal tenderness in the right upper quadrant. There is right CVA tenderness. There is no left CVA tenderness, guarding or rebound. Positive signs include Murphy's sign. Negative signs include McBurney's sign.     Hernia: There is no hernia in the umbilical area or ventral area.  Musculoskeletal:     Cervical back: Neck supple.  Skin:    General: Skin is warm and dry.     Coloration: Skin is not jaundiced or pale.  Neurological:     General: No focal deficit  present.     Mental Status: She is alert.  Psychiatric:        Behavior: Behavior is cooperative.     ED Results / Procedures / Treatments   Labs (all labs ordered are listed, but only abnormal results are displayed) Labs Reviewed  CBC WITH DIFFERENTIAL/PLATELET - Abnormal; Notable for the following components:      Result Value   RBC 3.60 (*)    Hemoglobin 9.4 (*)    HCT 29.5 (*)    All other components within normal limits  COMPREHENSIVE METABOLIC PANEL - Abnormal; Notable for the following components:   Glucose, Bld 124 (*)    Calcium 8.1 (*)    Albumin 3.0 (*)    AST 11 (*)    All other components within normal limits  URINALYSIS, ROUTINE W REFLEX MICROSCOPIC - Abnormal; Notable for the following components:   Hgb urine dipstick TRACE (*)    Leukocytes,Ua TRACE (*)    All other components within normal limits  URINALYSIS, MICROSCOPIC (REFLEX) - Abnormal; Notable for the following components:   Bacteria, UA RARE (*)    All other components within normal limits  SARS CORONAVIRUS 2 (TAT 6-24 HRS)  LIPASE, BLOOD  PREGNANCY, URINE    EKG None  Radiology No results found.  Procedures Procedures   Medications Ordered in ED Medications  morphine 4 MG/ML injection 4 mg (has no administration in time range)  morphine 4 MG/ML injection 4 mg (4 mg Intravenous Given 07/25/20 2336)  metoCLOPramide (REGLAN) injection 10 mg (10 mg Intravenous Given 07/25/20 2336)    ED Course  I have reviewed the triage vital signs and the nursing notes.  Pertinent labs & imaging results that were available during my care of the patient were reviewed by me and considered in my medical decision making (see chart for details).    MDM Rules/Calculators/A&P                          Alert 30 year old female uncomfortable due to complaints of pain.  Patient is nontoxic-appearing.  Patient presents with chief complaint of right upper quadrant abdominal pain.  Patient was seen 5/5 for same  complaint.  Reports that pain has recently worsened since then.  Pain is not managed  in outpatient setting with oral Percocet.  Patient also complaining of nausea which is uncontrolled in outpatient setting with oral Zofran.  Patient had extensive work-up including CT abdomen pelvis as well as right upper quadrant ultrasound.  CT scan showed a defined hypodensity within the upper pole of the right kidney with mild perinephric fluid and edema.  Patient was started on Keflex 500 mg twice daily for possible pyelonephritis.  Right upper quadrant ultrasound showed positive sonographic Murphy sign in the absence of cholelithiasis.  HIDA scan was recommended to evaluate for early acalculous cholecystitis  Today patient has temperature of 99.3 orally.  Patient is hemodynamically stable.  Patient has tenderness to right upper quadrant, positive Murphy sign, right CVA tenderness.  Abdomen is soft, nondistended.  Will obtain CBC, CMP, lipase, urine pregnancy, urinalysis.  We will give patient morphine for pain management and Reglan for antiemetic.  Lipase within normal limits.  Low suspicion for acute pancreatitis. CBC shows anemia with hemoglobin at 9.4 and hematocrit 29.5.  Patient denies any rectal bleeding, melena, hematuria, hematemesis. CMP shows slight decrease in calcium, albumin, and AST. Urinalysis shows bacteria rare, leukocytes trace, nitrite negative, WBC 6-10.  Per chart review patient urine culture grew out Enterococcus faecilis.  Patient will likely need change in antibiotics for continued coverage.  On serial repeat examination abdomen soft, nondistended.  Patient reports minimal improvement in her pain after receiving morphine.  Will give second dose of morphine.  Will consult hospitalist for admission for pain management.  Patient will likely need HIDA scan in the near future to evaluate for acalculous cholecystitis with continued right upper quadrant pain.  Patient was discussed with and  evaluated by Dr. Read Drivers.   Spoke to Dr. Antionette Char who agreed to have patient transferred for admission.    Final Clinical Impression(s) / ED Diagnoses Final diagnoses:  Right upper quadrant abdominal pain    Rx / DC Orders ED Discharge Orders    None       Haskel Schroeder, PA-C 07/26/20 0109    Molpus, Jonny Ruiz, MD 07/26/20 0145

## 2020-07-26 ENCOUNTER — Encounter (HOSPITAL_COMMUNITY): Payer: Self-pay | Admitting: Family Medicine

## 2020-07-26 ENCOUNTER — Inpatient Hospital Stay (HOSPITAL_COMMUNITY): Payer: Medicaid Other

## 2020-07-26 DIAGNOSIS — M1990 Unspecified osteoarthritis, unspecified site: Secondary | ICD-10-CM | POA: Diagnosis present

## 2020-07-26 DIAGNOSIS — Z20822 Contact with and (suspected) exposure to covid-19: Secondary | ICD-10-CM | POA: Diagnosis present

## 2020-07-26 DIAGNOSIS — Z9884 Bariatric surgery status: Secondary | ICD-10-CM | POA: Diagnosis not present

## 2020-07-26 DIAGNOSIS — Z91048 Other nonmedicinal substance allergy status: Secondary | ICD-10-CM | POA: Diagnosis not present

## 2020-07-26 DIAGNOSIS — N10 Acute tubulo-interstitial nephritis: Secondary | ICD-10-CM | POA: Diagnosis present

## 2020-07-26 DIAGNOSIS — A498 Other bacterial infections of unspecified site: Secondary | ICD-10-CM

## 2020-07-26 DIAGNOSIS — B952 Enterococcus as the cause of diseases classified elsewhere: Secondary | ICD-10-CM

## 2020-07-26 DIAGNOSIS — E10649 Type 1 diabetes mellitus with hypoglycemia without coma: Secondary | ICD-10-CM | POA: Diagnosis not present

## 2020-07-26 DIAGNOSIS — R1011 Right upper quadrant pain: Secondary | ICD-10-CM | POA: Diagnosis present

## 2020-07-26 DIAGNOSIS — Z9641 Presence of insulin pump (external) (internal): Secondary | ICD-10-CM | POA: Diagnosis present

## 2020-07-26 DIAGNOSIS — Z794 Long term (current) use of insulin: Secondary | ICD-10-CM | POA: Diagnosis not present

## 2020-07-26 DIAGNOSIS — Z91018 Allergy to other foods: Secondary | ICD-10-CM | POA: Diagnosis not present

## 2020-07-26 DIAGNOSIS — Z825 Family history of asthma and other chronic lower respiratory diseases: Secondary | ICD-10-CM | POA: Diagnosis not present

## 2020-07-26 DIAGNOSIS — E66813 Obesity, class 3: Secondary | ICD-10-CM

## 2020-07-26 DIAGNOSIS — R109 Unspecified abdominal pain: Secondary | ICD-10-CM | POA: Insufficient documentation

## 2020-07-26 DIAGNOSIS — A419 Sepsis, unspecified organism: Secondary | ICD-10-CM | POA: Diagnosis not present

## 2020-07-26 DIAGNOSIS — Z79899 Other long term (current) drug therapy: Secondary | ICD-10-CM | POA: Diagnosis not present

## 2020-07-26 DIAGNOSIS — I10 Essential (primary) hypertension: Secondary | ICD-10-CM

## 2020-07-26 DIAGNOSIS — E1040 Type 1 diabetes mellitus with diabetic neuropathy, unspecified: Secondary | ICD-10-CM | POA: Diagnosis present

## 2020-07-26 DIAGNOSIS — E108 Type 1 diabetes mellitus with unspecified complications: Secondary | ICD-10-CM

## 2020-07-26 DIAGNOSIS — Z6841 Body Mass Index (BMI) 40.0 and over, adult: Secondary | ICD-10-CM | POA: Diagnosis not present

## 2020-07-26 DIAGNOSIS — J4520 Mild intermittent asthma, uncomplicated: Secondary | ICD-10-CM | POA: Diagnosis present

## 2020-07-26 LAB — URINE CULTURE: Culture: 60000 — AB

## 2020-07-26 LAB — URINALYSIS, ROUTINE W REFLEX MICROSCOPIC
Bilirubin Urine: NEGATIVE
Glucose, UA: NEGATIVE mg/dL
Ketones, ur: NEGATIVE mg/dL
Nitrite: NEGATIVE
Protein, ur: NEGATIVE mg/dL
Specific Gravity, Urine: 1.015 (ref 1.005–1.030)
pH: 6 (ref 5.0–8.0)

## 2020-07-26 LAB — GLUCOSE, CAPILLARY
Glucose-Capillary: 45 mg/dL — ABNORMAL LOW (ref 70–99)
Glucose-Capillary: 93 mg/dL (ref 70–99)
Glucose-Capillary: 58 mg/dL — ABNORMAL LOW (ref 70–99)
Glucose-Capillary: 85 mg/dL (ref 70–99)
Glucose-Capillary: 88 mg/dL (ref 70–99)
Glucose-Capillary: 71 mg/dL (ref 70–99)

## 2020-07-26 LAB — HIV ANTIBODY (ROUTINE TESTING W REFLEX): HIV Screen 4th Generation wRfx: NONREACTIVE

## 2020-07-26 LAB — URINALYSIS, MICROSCOPIC (REFLEX)

## 2020-07-26 LAB — SARS CORONAVIRUS 2 (TAT 6-24 HRS): SARS Coronavirus 2: NEGATIVE

## 2020-07-26 MED ORDER — ACETAMINOPHEN 650 MG RE SUPP: 650.0000 mg | Freq: Four times a day (QID) | RECTAL | Status: DC | PRN

## 2020-07-26 MED ORDER — INSULIN ASPART 100 UNIT/ML IJ SOLN: 0.0000 [IU] | Freq: Three times a day (TID) | INTRAMUSCULAR | Status: DC

## 2020-07-26 MED ORDER — HYDROMORPHONE HCL 1 MG/ML IJ SOLN
0.5000 mg | INTRAMUSCULAR | Status: DC | PRN
  Administered 2020-07-26: 0.5 mg via INTRAVENOUS
  Filled 2020-07-26: qty 0.5

## 2020-07-26 MED ORDER — IRBESARTAN 150 MG PO TABS
150.0000 mg | ORAL_TABLET | Freq: Every day | ORAL | Status: DC
  Administered 2020-07-26 – 2020-07-27 (×2): 150 mg via ORAL
  Filled 2020-07-26 (×2): qty 1

## 2020-07-26 MED ORDER — ONDANSETRON HCL 4 MG/2ML IJ SOLN: 4.0000 mg | Freq: Four times a day (QID) | INTRAMUSCULAR | Status: DC | PRN

## 2020-07-26 MED ORDER — DIPHENHYDRAMINE HCL 25 MG PO CAPS
25.0000 mg | ORAL_CAPSULE | Freq: Four times a day (QID) | ORAL | Status: DC | PRN
  Administered 2020-07-26: 25 mg via ORAL
  Filled 2020-07-26: qty 1

## 2020-07-26 MED ORDER — POLYETHYLENE GLYCOL 3350 17 G PO PACK: 17.0000 g | PACK | Freq: Every day | ORAL | Status: DC | PRN

## 2020-07-26 MED ORDER — ACETAMINOPHEN 325 MG PO TABS
650.0000 mg | ORAL_TABLET | Freq: Four times a day (QID) | ORAL | Status: DC | PRN
  Administered 2020-07-26 – 2020-07-27 (×2): 650 mg via ORAL
  Filled 2020-07-26 (×2): qty 2

## 2020-07-26 MED ORDER — BRINZOLAMIDE 1 % OP SUSP
1.0000 [drp] | Freq: Two times a day (BID) | OPHTHALMIC | Status: DC
  Filled 2020-07-26: qty 10

## 2020-07-26 MED ORDER — FLUOXETINE HCL 20 MG PO CAPS
40.0000 mg | ORAL_CAPSULE | Freq: Every morning | ORAL | Status: DC
  Administered 2020-07-26 – 2020-07-27 (×2): 40 mg via ORAL
  Filled 2020-07-26 (×2): qty 2

## 2020-07-26 MED ORDER — INSULIN ASPART 100 UNIT/ML IJ SOLN: 0.0000 [IU] | Freq: Every day | INTRAMUSCULAR | Status: DC

## 2020-07-26 MED ORDER — LACTATED RINGERS IV SOLN: INTRAVENOUS | Status: DC

## 2020-07-26 MED ORDER — PREDNISOLONE ACETATE 1 % OP SUSP
1.0000 [drp] | Freq: Four times a day (QID) | OPHTHALMIC | Status: DC
  Administered 2020-07-26 – 2020-07-27 (×2): 1 [drp] via OPHTHALMIC

## 2020-07-26 MED ORDER — PRAZOSIN HCL 1 MG PO CAPS
2.0000 mg | ORAL_CAPSULE | Freq: Every day | ORAL | Status: DC
  Administered 2020-07-26: 2 mg via ORAL
  Filled 2020-07-26: qty 2

## 2020-07-26 MED ORDER — DEXTROSE-NACL 5-0.45 % IV SOLN: INTRAVENOUS | Status: DC

## 2020-07-26 MED ORDER — PREDNISOLONE ACETATE 1 % OP SUSP
1.0000 [drp] | Freq: Three times a day (TID) | OPHTHALMIC | Status: DC
  Administered 2020-07-26 – 2020-07-27 (×2): 1 [drp] via OPHTHALMIC
  Filled 2020-07-26: qty 5

## 2020-07-26 MED ORDER — BROMFENAC SODIUM 0.07 % OP SOLN
1.0000 [drp] | Freq: Every day | OPHTHALMIC | Status: DC
  Administered 2020-07-27: 1 [drp] via OPHTHALMIC

## 2020-07-26 MED ORDER — PREDNISOLONE ACETATE 1 % OP SUSP: 1.0000 [drp] | Freq: Four times a day (QID) | OPHTHALMIC | Status: DC

## 2020-07-26 MED ORDER — LISINOPRIL 20 MG PO TABS: 20.0000 mg | ORAL_TABLET | Freq: Every day | ORAL | Status: DC

## 2020-07-26 MED ORDER — MORPHINE SULFATE (PF) 4 MG/ML IV SOLN
4.0000 mg | Freq: Once | INTRAVENOUS | Status: AC
  Administered 2020-07-26: 4 mg via INTRAVENOUS
  Filled 2020-07-26: qty 1

## 2020-07-26 MED ORDER — ONDANSETRON HCL 4 MG PO TABS: 4.0000 mg | ORAL_TABLET | Freq: Four times a day (QID) | ORAL | Status: DC | PRN

## 2020-07-26 MED ORDER — ALBUTEROL SULFATE HFA 108 (90 BASE) MCG/ACT IN AERS: 2.0000 | INHALATION_SPRAY | RESPIRATORY_TRACT | Status: DC | PRN

## 2020-07-26 MED ORDER — HYDROXYCHLOROQUINE SULFATE 200 MG PO TABS
200.0000 mg | ORAL_TABLET | Freq: Two times a day (BID) | ORAL | Status: DC
  Administered 2020-07-26 – 2020-07-27 (×3): 200 mg via ORAL
  Filled 2020-07-26 (×3): qty 1

## 2020-07-26 MED ORDER — PIPERACILLIN-TAZOBACTAM 3.375 G IVPB
3.3750 g | Freq: Three times a day (TID) | INTRAVENOUS | Status: DC
  Administered 2020-07-26 – 2020-07-27 (×4): 3.375 g via INTRAVENOUS
  Filled 2020-07-26 (×6): qty 50

## 2020-07-26 MED ORDER — HYDROMORPHONE HCL 1 MG/ML IJ SOLN
1.0000 mg | INTRAMUSCULAR | Status: DC | PRN
  Administered 2020-07-26: 1 mg via INTRAVENOUS
  Filled 2020-07-26: qty 1

## 2020-07-26 MED ORDER — HYDROCODONE-ACETAMINOPHEN 5-325 MG PO TABS
1.0000 | ORAL_TABLET | Freq: Four times a day (QID) | ORAL | Status: DC | PRN
  Administered 2020-07-26 (×2): 1 via ORAL
  Filled 2020-07-26 (×2): qty 1

## 2020-07-26 MED ORDER — TRAZODONE HCL 100 MG PO TABS
100.0000 mg | ORAL_TABLET | Freq: Every day | ORAL | Status: DC
  Administered 2020-07-26: 100 mg via ORAL
  Filled 2020-07-26: qty 1

## 2020-07-26 MED ORDER — BRIMONIDINE TARTRATE 0.2 % OP SOLN
1.0000 [drp] | Freq: Two times a day (BID) | OPHTHALMIC | Status: DC
  Filled 2020-07-26: qty 5

## 2020-07-26 NOTE — Progress Notes (Signed)
Rn notified Md of concern for low blood glucose with insulin pump. Also concern that pt needs her eye drops for cataracts.

## 2020-07-26 NOTE — Progress Notes (Signed)
Received pt from [facility], pt a/ox4. VSS. No signs of distress noted. Oriented pt to room and the use of the call bell. All items within reach. Notified admissions of pt's arrival Awaiting further orders.

## 2020-07-26 NOTE — Plan of Care (Signed)
  Problem: Clinical Measurements: Goal: Respiratory complications will improve Outcome: Progressing   Problem: Clinical Measurements: Goal: Cardiovascular complication will be avoided Outcome: Progressing   Problem: Coping: Goal: Level of anxiety will decrease Outcome: Progressing   Problem: Elimination: Goal: Will not experience complications related to bowel motility Outcome: Progressing   Problem: Pain Managment: Goal: General experience of comfort will improve Outcome: Progressing   Problem: Skin Integrity: Goal: Risk for impaired skin integrity will decrease Outcome: Progressing

## 2020-07-26 NOTE — H&P (Signed)
History and Physical    [name] IHK:[id number] DOB: [birth year] DOA: 07/25/2020  PCP: [name], MD   [name] coming from: Home via [facility]  Chief Complaint: RUQ abdominal pain with nausea.  HPI: [name] is a 30 y.o. female with medical history significant for DMT1 and uses an insulin pump, HTN, s/p gastric bypass, mild intermittent asthma who presented to the emergency room at [facility] with complaint of abdominal pain associate with nausea.  She reports symptoms been present for the last 4 to 5 days.  She was seen at [facility] room on May 5,2022 and had extensive work-up which included a CT of her abdomen and pelvis and a right upper quadrant ultrasound.  CT scan showed hypodensity in the upper pole of the right kidney with surrounding perinephric fluid and edema.  She was started on Keflex 500 mg twice a day for possible pyelonephritis at that time.  Right upper quadrant ultrasound had positive sonographic Murphy sign but no cholelithiasis was present.  She was given to be sent for an outpatient HIDA scan for possible acalculous cholecystitis.  Over the last few days she has had continued abdominal pain in the right upper quadrant that is severe at times to an 8-9 out of 10.  She has had nausea but has not vomited last 24 hours.  She did have an episode of vomiting few days ago she states.  She has not had any fever or chills.  She denies any injury or trauma to her abdomen.  She was not getting any relief with the antibiotic and Percocet she was prescribed at the last ER visit.  The pain will radiate from her right upper quadrant around to her.  She denies any urinary frequency or dysuria. Urine culture obtained on May 5 showed 60,000 colony units of Enterococcus faecalis.  She has had some mild constipation and took some over-the-counter laxative that did not help.  She denies tobacco, alcohol, illicit drug use.  ED Course: She has been hemodynamically stable in the  emergency room.  Labs are unremarkable except for hemoglobin 9.4 hematocrit 29.5.  Urinalysis with trace leukocytosis and hemoglobin otherwise negative.  Hospitalist service asked to admit [name] for further management  Review of Systems:  General: Denies  fever, chills, weight loss, night sweats.  Denies dizziness.  HENT: Denies change in hearing, tinnitus. Denies nasal congestion or bleeding. Denies sore throat, sores in mouth.  Denies difficulty swallowing Eyes: Denies blurry vision, pain in eye, drainage.  Denies discoloration of eyes. Neck: Denies pain.  Denies swelling.  Denies pain with movement. Cardiovascular: Denies chest pain, palpitations.  Denies edema.  Denies orthopnea Respiratory: Denies shortness of breath, cough. Denies wheezing. Denies sputum production Gastrointestinal: Reports RUQ abdominal pain with nausea. Denies vomiting, diarrhea.  Denies melena.  Denies hematemesis. Musculoskeletal: Denies limitation of movement.  Denies deformity or swelling.  Denies pain.  Denies arthralgias or myalgias. Genitourinary: Denies pelvic pain.  Denies urinary frequency or hesitancy.  Denies dysuria.  Skin: Denies rash.  Denies petechiae, purpura, ecchymosis. Neurological: Denies headache. Denies syncope. Denies seizure activity. Denies slurred speech, drooping face.  Denies visual change. Psychiatric: Denies depression, anxiety. Denies hallucinations.  Past Medical History:  Diagnosis Date  . Anemia   . Arthritis   . Asthma   . Diabetes mellitus without complication (HCC)   . Hypertension   . Vitamin D deficiency     Past Surgical History:  Procedure Laterality Date  . GASTRIC BYPASS  07/2017    Social History  reports that she has never smoked. She has never used smokeless tobacco. She reports current alcohol use. She reports that she does not use drugs.  Allergies  Allergen Reactions  . Fruit Extracts Itching    All fruits some worse than others  . Zinc     Family  History  Problem Relation Age of Onset  . Lupus Maternal Grandmother   . Asthma Sister   . ADD / ADHD Sister      Prior to Admission medications   Medication Sig Start Date End Date Taking? Authorizing Provider  albuterol (PROVENTIL HFA;VENTOLIN HFA) 108 (90 Base) MCG/ACT inhaler Inhale 2 puffs into the lungs every 4 (four) hours as needed for wheezing.    [provider]  baclofen (LIORESAL) 10 MG tablet Take 10 mg by mouth 3 (three) times daily as needed. 07/12/19   [provider]  cephALEXin (KEFLEX) 500 MG capsule Take 1 capsule (500 mg total) by mouth 2 (two) times daily for 9 days. 07/24/20 08/02/20  Terald Sleeper, MD  ferrous sulfate 325 (65 FE) MG tablet Take 325 mg by mouth daily.    [provider]  FLUoxetine (PROZAC) 40 MG capsule Take 40 mg by mouth every morning. 03/07/20   [provider]  HUMALOG 100 UNIT/ML injection Inject 100 Units as directed See admin instructions. Inject up to 100 units per day via insulin pump as directed 02/06/18   [provider]  HYDROcodone-acetaminophen (NORCO) 10-325 MG tablet Take 1 tablet by mouth every 4 (four) hours as needed for pain.    [provider]  hydroxychloroquine (PLAQUENIL) 200 MG tablet TAKE 1 TABLET(200 MG) BY MOUTH TWICE DAILY 04/17/20   Gearldine Bienenstock, PA-C  lisinopril (ZESTRIL) 10 MG tablet Take 20 mg by mouth daily. 05/28/19   [provider]  omeprazole (PRILOSEC) 40 MG capsule Take 40 mg by mouth as needed. [name] not taking: Reported on 06/17/2020 11/14/19   [provider]  ondansetron (ZOFRAN ODT) 4 MG disintegrating tablet Take 1 tablet (4 mg total) by mouth every 8 (eight) hours as needed for up to 15 doses for nausea or vomiting. 07/23/20   Trifan, Kermit Balo, MD  oxyCODONE (ROXICODONE) 5 MG immediate release tablet Take 1 tablet (5 mg total) by mouth every 6 (six) hours as needed for up to 15 doses for severe pain. 07/23/20   Terald Sleeper, MD   prazosin (MINIPRESS) 2 MG capsule Take 2 mg by mouth at bedtime. 03/06/20   [provider]  traZODone (DESYREL) 50 MG tablet Take 100 mg by mouth at bedtime. 03/06/20   [provider]  triamcinolone cream (KENALOG) 0.1 % Apply 1 application topically as needed. Apply 1-2 times a day to affected areas of dry itchy skin on the body until clear, then as needed. Not to face. 09/08/16   [provider]    Physical Exam: Vitals:   07/26/20 0045 07/26/20 0337 07/26/20 0438 07/26/20 0457  BP: 133/63 (!) 129/56 139/78   Pulse: 90 93 86   Resp: Temp:  99.4 F (37.4 C) 98.5 F (36.9 C)   TempSrc:  Oral Oral   SpO2: 100% 100% 100%   Weight:    124.1 kg  Height:     (1.626 m)    Constitutional: NAD, calm, comfortable Vitals:   07/26/20 0045 07/26/20 0337 07/26/20 0438 07/26/20 0457  BP: 133/63 (!) 129/56 139/78  Pulse: 90 93 86   Resp: 16 18 18    Temp:  99.4 F (37.4 C) 98.5 F (36.9 C)   TempSrc:  Oral Oral   SpO2: 100% 100% 100%   Weight:    124.1 kg  Height:    5\' 4"  (1.626 m)   General: WDWN, Alert and oriented x3.  Eyes: EOMI, PERRL, conjunctivae normal.  Sclera nonicteric HENT:  [place]/AT, external ears normal.  Nares patent without epistasis.  Mucous membranes are moist.  Neck: Soft, normal range of motion, supple, no masses, Trachea midline Respiratory: clear to auscultation bilaterally, no wheezing, no crackles. Normal respiratory effort. No accessory muscle use.  Cardiovascular: Regular rate and rhythm, no murmurs / rubs / gallops. No extremity edema. Abdomen: Soft, RUQ abdominal tenderness. Positive Murphy's sign. nondistended, no rebound or guarding. Morbidly obese. No masses palpated. Bowel sounds normoactive. No CVA tenderness. Musculoskeletal: FROM. no cyanosis. Normal muscle tone.  Skin: Warm, dry, intact no rashes, lesions, ulcers. No induration Neurologic: Normal speech.  Sensation intact, Strength 5/5 in all extremities.    Psychiatric: Normal judgment and insight.  Normal mood.    Labs on Admission: I have personally reviewed following labs and imaging studies  CBC: Recent Labs  Lab 07/23/20 1729 07/25/20 2244  WBC 10.2 7.9  NEUTROABS  --  4.6  HGB 10.3* 9.4*  HCT 32.2* 29.5*  MCV 81.5 81.9  PLT 212 205    Basic Metabolic Panel: Recent Labs  Lab 07/23/20 1729 07/25/20 2244  NA 135 135  K 3.7 3.8  CL 101 101  CO2 25 26  GLUCOSE 247* 124*  BUN 11 14  CREATININE 0.93 0.84  CALCIUM 8.5* 8.1*    GFR: Estimated Creatinine Clearance: 129.9 mL/min (by C-G formula based on SCr of 0.84 mg/dL).  Liver Function Tests: Recent Labs  Lab 07/23/20 1729 07/25/20 2244  AST 10* 11*  ALT 8 12  ALKPHOS 55 48  BILITOT 0.5 0.3  PROT 7.4 6.8  ALBUMIN 3.7 3.0*    Urine analysis:    Component Value Date/Time   COLORURINE YELLOW 07/25/2020 2344   APPEARANCEUR CLEAR 07/25/2020 2344   LABSPEC 1.015 07/25/2020 2344   PHURINE 6.0 07/25/2020 2344   GLUCOSEU NEGATIVE 07/25/2020 2344   HGBUR TRACE (A) 07/25/2020 2344   BILIRUBINUR NEGATIVE 07/25/2020 2344   KETONESUR NEGATIVE 07/25/2020 2344   PROTEINUR NEGATIVE 07/25/2020 2344   UROBILINOGEN 2.0 (H) 03/03/2012 1535   NITRITE NEGATIVE 07/25/2020 2344   LEUKOCYTESUR TRACE (A) 07/25/2020 2344    Radiological Exams on Admission: No results found.  Assessment/Plan Principal Problem:   Right upper quadrant abdominal pain [name] has persistent RUQ abdominal pain with two visits to ER in past few days.  Had CT scan on 07/23/20 that mild perinephric fluid and edema of the right kidney with a small hypodensity in the upper pole of the right kidney spacious for pyelonephritis.  Right upper quadrant ultrasound was positive for sonographic Murphy sign but no cholelithiasis was identified. Pt has hx of Bariatric surgery which does raise risk of gallbladder dysfunction.  It is possible [name] has a calculus cholecystitis and her dysfunctional gallbladder  causing pain.  We will obtain HIDA scan to further evaluate. Pain control with dilaudid as needed. Anti-emetic provided.  IVF hydration.   Active Problems:   Acute pyelonephritis Pyelonephritis per the CT results as above.  [name] also had a urine culture on Jul 23, 2020 that grew out 60,000 colony units of Enterococcus bacillus.  [name] was placed  on Keflex on May 5 but has not improved with this therapy.  Changed to Zosyn for antibiotic coverage at this time IVF hydration    Diabetes mellitus type 1, controlled, with complications She has an insulin pump that she can continue to use.  We will monitor blood sugars with meals and at bedtime.  Sliding scale insulin provided as needed for glycemic control. She reports she had a hemoglobin A1c that was between 7 and 8 within the last 3 months so we will not repeat A1c at this time    Enterococcus faecalis infection Treated with zosyn as above.     Essential hypertension Continue Lisinopril. Monitor BP.     Obesity, Class III, BMI 40-49.9 (morbid obesity) (HCC) Follow-up with PCP for dietary lifestyle interventions, pharmacotherapy therapy for long term weight loss.     DVT prophylaxis: Padua score low.  Early ambulation for DVT prophylaxis Code Status:   Full code Family Communication:  Diagnosis and plan discussed with [name].  [name] verbalized understanding agrees with plan.  Further recommendations to follow as clinically indicated Disposition Plan:   [name] is from:  Home  Anticipated DC to:  Home  Anticipated DC date:  Anticipate 2 midnight stay in the hospital to treat acute condition  Anticipated DC barriers: No barriers to discharge identified at this time  Admission status:  Inpatient   Claudean Severance Lakenya Riendeau MD [facility]  How to contact the [facility] Attending or Consulting provider 7A - 7P or covering provider during after hours 7P -7A, for this [name]?   1. Check the care team in [facility] and look for a)  attending/consulting [facility] provider listed and b) the [facility] team listed 2. Log into [email or web address] and use [facility] universal password to access. If you do not have the password, please contact the hospital operator. 3. Locate the [facility] provider you are looking for under [facility] and page to a number that you can be directly reached. 4. If you still have difficulty reaching the provider, please page the [facility] (Director on Call) for the Hospitalists listed on amion for assistance.  07/26/2020, 5:12 AM

## 2020-07-26 NOTE — Progress Notes (Signed)
PROGRESS NOTE    [name]  SWF:[id number] DOB: 1990-04-13 DOA: 07/25/2020 PCP: [name], MD   Brief Narrative:  [name] is a 30 y.o. female with medical history significant for DMT1 and uses an insulin pump, HTN, s/p gastric bypass, mild intermittent asthma who presented to the emergency room at [facility] with complaint of abdominal pain associate with nausea.  Urgent care evaluated 07/23/20 with CT of her abdomen and pelvis and a right upper quadrant ultrasound showing hypodensity in the upper pole of the right kidney with surrounding perinephric fluid and edema sent home on Keflex 500 mg twice a day for possible pyelonephritis at that time.  Right upper quadrant ultrasound had positive sonographic Murphy sign but no cholelithiasis was present.  She represented to the ER with worsening abdominal pain, urine culture from previous shows E faecalis, given concern for pyelonephritis and failure of outpatient Keflex patient was admitted for IV antibiotics.  Assessment & Plan:   Principal Problem:   Right upper quadrant abdominal pain Active Problems:   Acute pyelonephritis   Diabetes mellitus type 1, controlled, with complications (HCC)   Obesity, Class III, BMI 40-49.9 (morbid obesity) (HCC)   Enterococcus faecalis infection   Essential hypertension   RUQ abdominal pain   Sepsis secondary to pyelonephritis, POA, failure of outpatient Keflex -Based on urine culture and previous imaging, E faecalis -Failed Keflex, now on Zosyn   -Continue IVF hydration  Right upper quadrant abdominal pain -Likely in the setting of pyelonephritis as above however patient did have positive Murphy's sign without cholelithiasis on previous ultrasound of the right upper quadrant  -HIDA scan pending for further evaluation  -History of gastric bypass does increase risk of scar tissue, obstruction and gallbladder dysfunction  Diabetes mellitus type 1, controlled -Continue insulin pump,  Accu-Chek  -Follow clinically, patient's diet now advancing appropriately, mild hypoglycemia early this morning but appears to be resolving with p.o. intake  Essential hypertension Continue ARB. Monitor BP.   Obesity, Class III, BMI 40-49.9 (morbid obesity) (HCC) Follow-up with PCP for dietary lifestyle interventions, pharmacotherapy therapy for long term weight loss.   DVT prophylaxis:     None, early ambulation  Code status:  Full code Family Communication: Mother at bedside  Status is: Inpatient  Dispo: The patient is from: home              Anticipated d/c is to: Home              Anticipated d/c date is: 24 to 48 hours pending clinical course              Patient currently not medically stable for discharge given need for ongoing IV antibiotics and imaging  Consultants:   None  Procedures:   None  Antimicrobials:  Zosyn  Subjective: No acute issues or events overnight denies nausea vomiting diarrhea constipation headache fevers or chills.  Objective: Vitals:   07/26/20 0045 07/26/20 0337 07/26/20 0438 07/26/20 0457  BP: 133/63 (!) 129/56 139/78   Pulse: 90 93 86   Resp: 16 18 18    Temp:  99.4 F (37.4 C) 98.5 F (36.9 C)   TempSrc:  Oral Oral   SpO2: 100% 100% 100%   Weight:    124.1 kg  Height:    5\' 4"  (1.626 m)    Intake/Output Summary (Last 24 hours) at 07/26/2020 0720 Last data filed at 07/26/2020 0719 Gross per 24 hour  Intake 254.5 ml  Output --  Net 254.5  ml   Filed Weights   07/25/20 2202 07/26/20 0457  Weight: 124.3 kg 124.1 kg    Examination: General:  Pleasantly resting in bed, No acute distress. HEENT:  Normocephalic atraumatic.  Sclerae nonicteric, noninjected.  Extraocular movements intact bilaterally. Neck:  Without mass or deformity.  Trachea is midline. Lungs:  Clear to auscultate bilaterally without rhonchi, wheeze, or rales. Heart:  Regular rate and rhythm.  Without murmurs, rubs, or gallops. Abdomen:  Soft, minimally tender  diffusely, PMI at the midepigastrium without notable flank tenderness, nondistended.  Without guarding or rebound. Extremities: Without cyanosis, clubbing, edema, or obvious deformity. Vascular:  Dorsalis pedis and posterior tibial pulses palpable bilaterally. Skin:  Warm and dry, no erythema, no ulcerations.   Data Reviewed: I have personally reviewed following labs and imaging studies  CBC: Recent Labs  Lab 07/23/20 1729 07/25/20 2244  WBC 10.2 7.9  NEUTROABS  --  4.6  HGB 10.3* 9.4*  HCT 32.2* 29.5*  MCV 81.5 81.9  PLT 212 205   Basic Metabolic Panel: Recent Labs  Lab 07/23/20 1729 07/25/20 2244  NA 135 135  K 3.7 3.8  CL 101 101  CO2 25 26  GLUCOSE 247* 124*  BUN 11 14  CREATININE 0.93 0.84  CALCIUM 8.5* 8.1*   GFR: Estimated Creatinine Clearance: 129.9 mL/min (by C-G formula based on SCr of 0.84 mg/dL). Liver Function Tests: Recent Labs  Lab 07/23/20 1729 07/25/20 2244  AST 10* 11*  ALT 8 12  ALKPHOS 55 48  BILITOT 0.5 0.3  PROT 7.4 6.8  ALBUMIN 3.7 3.0*   Recent Labs  Lab 07/23/20 1729 07/25/20 2244  LIPASE <10* 19   No results for input(s): AMMONIA in the last 168 hours. Coagulation Profile: No results for input(s): INR, PROTIME in the last 168 hours. Cardiac Enzymes: No results for input(s): CKTOTAL, CKMB, CKMBINDEX, TROPONINI in the last 168 hours. BNP (last 3 results) No results for input(s): PROBNP in the last 8760 hours. HbA1C: No results for input(s): HGBA1C in the last 72 hours. CBG: No results for input(s): GLUCAP in the last 168 hours. Lipid Profile: No results for input(s): CHOL, HDL, LDLCALC, TRIG, CHOLHDL, LDLDIRECT in the last 72 hours. Thyroid Function Tests: No results for input(s): TSH, T4TOTAL, FREET4, T3FREE, THYROIDAB in the last 72 hours. Anemia Panel: No results for input(s): VITAMINB12, FOLATE, FERRITIN, TIBC, IRON, RETICCTPCT in the last 72 hours. Sepsis Labs: No results for input(s): PROCALCITON, LATICACIDVEN in  the last 168 hours.  Recent Results (from the past 240 hour(s))  Urine culture     Status: Abnormal (Preliminary result)   Collection Time: 07/23/20  5:29 PM   Specimen: Urine, Clean Catch  Result Value Ref Range Status   Specimen Description   Final    URINE, CLEAN CATCH Performed at [facility], [address], [place], [place] [address]    Special Requests   Final    NONE Performed at [facility], [address] [place], [place] [address]    Culture 60,000 COLONIES/mL ENTEROCOCCUS FAECALIS (A)  Final   Report Status PENDING  Incomplete    Radiology Studies: No results found.  Scheduled Meds: . FLUoxetine  40 mg Oral q morning  . hydroxychloroquine  200 mg Oral BID  . insulin aspart  0-15 Units Subcutaneous TID WC  . insulin aspart  0-5 Units Subcutaneous QHS  . lisinopril  20 mg Oral Daily  . prazosin  2 mg Oral QHS  . traZODone  100 mg Oral  QHS   Continuous Infusions: . lactated ringers 100 mL/hr at 07/26/20 0719  . piperacillin-tazobactam (ZOSYN)  IV       LOS: 0 days   Time spent:  Azucena Fallen, DO [facility]  If 7PM-7AM, please contact night-coverage [email or web address]  07/26/2020, 7:20 AM

## 2020-07-27 ENCOUNTER — Inpatient Hospital Stay (HOSPITAL_COMMUNITY): Payer: Medicaid Other

## 2020-07-27 ENCOUNTER — Telehealth: Payer: Self-pay | Admitting: Emergency Medicine

## 2020-07-27 DIAGNOSIS — R1011 Right upper quadrant pain: Secondary | ICD-10-CM | POA: Diagnosis not present

## 2020-07-27 LAB — GLUCOSE, CAPILLARY
Glucose-Capillary: 111 mg/dL — ABNORMAL HIGH (ref 70–99)
Glucose-Capillary: 61 mg/dL — ABNORMAL LOW (ref 70–99)
Glucose-Capillary: 187 mg/dL — ABNORMAL HIGH (ref 70–99)

## 2020-07-27 LAB — CBC
HCT: 27.1 % — ABNORMAL LOW (ref 36.0–46.0)
Hemoglobin: 8.3 g/dL — ABNORMAL LOW (ref 12.0–15.0)
MCH: 25.9 pg — ABNORMAL LOW (ref 26.0–34.0)
MCHC: 30.6 g/dL (ref 30.0–36.0)
MCV: 84.7 fL (ref 80.0–100.0)
Platelets: 189 10*3/uL (ref 150–400)
RBC: 3.2 MIL/uL — ABNORMAL LOW (ref 3.87–5.11)
RDW: 13.2 % (ref 11.5–15.5)
WBC: 6.7 10*3/uL (ref 4.0–10.5)
nRBC: 0 % (ref 0.0–0.2)

## 2020-07-27 LAB — BASIC METABOLIC PANEL
Anion gap: 6 (ref 5–15)
BUN: 15 mg/dL (ref 6–20)
CO2: 27 mmol/L (ref 22–32)
Calcium: 7.9 mg/dL — ABNORMAL LOW (ref 8.9–10.3)
Chloride: 105 mmol/L (ref 98–111)
Creatinine, Ser: 0.89 mg/dL (ref 0.44–1.00)
GFR, Estimated: >60 mL/min (ref >60)
Glucose, Bld: 50 mg/dL — ABNORMAL LOW (ref 70–99)
Potassium: 4.1 mmol/L (ref 3.5–5.1)
Sodium: 138 mmol/L (ref 135–145)

## 2020-07-27 MED ORDER — DEXTROSE 50 % IV SOLN
INTRAVENOUS | Status: AC
  Filled 2020-07-27: qty 50

## 2020-07-27 MED ORDER — HYDROCODONE-ACETAMINOPHEN 5-325 MG PO TABS: 1.0000 | ORAL_TABLET | Freq: Four times a day (QID) | ORAL | 0 refills | Status: DC | PRN

## 2020-07-27 MED ORDER — TECHNETIUM TC 99M MEBROFENIN IV KIT
5.5000 | PACK | Freq: Once | INTRAVENOUS | Status: AC | PRN
  Administered 2020-07-27: 5.5 via INTRAVENOUS

## 2020-07-27 MED ORDER — AMOXICILLIN-POT CLAVULANATE 875-125 MG PO TABS: 1.0000 | ORAL_TABLET | Freq: Two times a day (BID) | ORAL | 0 refills | Status: AC

## 2020-07-27 MED ORDER — DEXTROSE 50 % IV SOLN
12.5000 g | INTRAVENOUS | Status: AC
  Administered 2020-07-27: 12.5 g via INTRAVENOUS

## 2020-07-27 NOTE — Telephone Encounter (Signed)
Reviewed the patients chart.  HIDA scan was normal today.  Please clarify if the patient would like a referral to GI for further evaluation or if she plans on following up with her PCP.

## 2020-07-27 NOTE — Telephone Encounter (Signed)
I called to check on patient and patient's partner Ladonna Snide advised me that the patient is currently admitted at [facility]. She is getting a HIDA scan right now. Per Ladonna Snide, patient is feeling okay but the pain returns when the patient moves around. A "fatty mass" was also discovered.

## 2020-07-27 NOTE — Progress Notes (Signed)
Patient discharged to home w/ family. Given all belongings, instructions. Verbalized understanding of instructions. Escorted to pov via w/c. 

## 2020-07-27 NOTE — Progress Notes (Signed)
Inpatient Diabetes Program Recommendations  AACE/ADA: New Consensus Statement on Inpatient Glycemic Control (2015)  Target Ranges:  Prepandial:   less than 140 mg/dL      Peak postprandial:   less than 180 mg/dL (1-2 hours)      Critically ill patients:  140 - 180 mg/dL   Lab Results  Component Value Date   GLUCAP 187 (H) 07/27/2020   HGBA1C (H) 02/01/2007    12.7 (NOTE)   The ADA recommends the following therapeutic goals for glycemic   control related to Hgb A1C measurement:   Goal of Therapy:   < 7.0% Hgb A1C   Action Suggested:  > 8.0% Hgb A1C   Ref:  Diabetes Care, 22, Suppl. 1, 1999    Review of Glycemic Control Results for CHIQUITTA, [name] (MRN [id number]) as of 07/27/2020 15:11  Ref. Range 07/26/2020 16:49 07/26/2020 20:49 07/27/2020 07:34 07/27/2020 08:20 07/27/2020 12:38  Glucose-Capillary Latest Ref Range: 70 - 99 mg/dL 88 71 61 (L) 505 (H) 697 (H)   Diabetes history: DM1 Outpatient Diabetes medications:  Novolog via Medtronic insulin pump Basal 2 units/hr for a total of 48 units daily ISF 20 Active insulin time-3 hrs Carb ratio 1 units: 15 carbs Current orders for Inpatient glycemic control:  Novolog 0-15 units TID and 0-5 units QHS  Inpatient Diabetes Program Recommendations:     Please place insulin pump order set and DC novolog 0-15 units TID and 0-5 units QHS.  [name], [name], [name], RN, [name] [phone]  (team pager from 8a-5p)

## 2020-07-27 NOTE — Plan of Care (Signed)

## 2020-07-27 NOTE — Telephone Encounter (Signed)
Patient called on-call service on Saturday at 9:30 pm to update me on her symptoms and recent visit to the ED. According to the patient she developed severe RUQ abdominal pain on thursday. She tried taking a stool softener on Thursday with no improvement in her symptoms.  She had several episodes of vomiting.  No fever.  She was evaluated in the ED on 07/23/20.  According to the patient she had lab work as well as an abdominal ultrasound and CT for further evaluation.  She states she was found to have "inflammation around her kidney."  Results reviewed mild perinephritic fluid and edema around right kidney, which were suspicious for pyelonephritis. She was started on keflex, oxycodone, and zofran 4 mg every 8 hours. She did not notice any improvement in her symptoms after taking 3 doses of keflex. She was concerned this inflammation could be due to underlying RA or SLE.  She has been taking her plaquenil as prescribed.  She called me on the way to the ED again on Saturday night due to persistent RUQ abdominal pain and SOB due to the discomfort. She was encouraged to be further evaluated in the ED and let us know how she is doing.  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   She also had a HIDA scan.  She was diagnosed with acute pyelonephritis and was prescribed zosyn and advised to discontinue keflex. She was also given dilaudid and IV fluid hydration while in the ED.    Please call the patient to check on how she is feeling.   Please advise the patient to follow up with her PCP if her symptoms persist or worsen.

## 2020-07-27 NOTE — Discharge Summary (Addendum)
Physician Discharge Summary  [name] LKZ:[id number] DOB: 23-Aug-1990 DOA: 07/25/2020  PCP: [name], MD  Admit date: 07/25/2020 Discharge date: 07/27/2020  Admitted From: Home Disposition: Home  Recommendations for Outpatient Follow-up:  1. Follow up with PCP in 1-2 weeks 2. Please obtain BMP/CBC in one week  Discharge Condition: Stable CODE STATUS: Full Diet recommendation: Diabetic diet  Brief/Interim Summary: [name] a 29 y.o.femalewith medical history significant forDMT1and uses an insulin pump,HTN, s/p gastric bypass, mild intermittent asthma who presented to the emergency room at [facility] with complaint of abdominal pain associate with nausea. Urgent care evaluated 07/23/20 with CT of her abdomen and pelvis and a right upper quadrant ultrasound showing hypodensity in the upper pole of the right kidney with surrounding perinephric fluid and edema sent home on Keflex 500 mg twice a day for possible pyelonephritis at that time. Right upper quadrant ultrasound had positive sonographic Murphy sign but no cholelithiasis was present.  She represented to the ER with worsening abdominal pain, urine culture from previous shows E faecalis, given concern for pyelonephritis and failure of outpatient Keflex patient was admitted for IV antibiotics.  Patient mated as above with recurrent acute abdominal and flank pain in the setting of likely UTI and pyelonephritis.  Given patient's questionably GI symptoms given right upper quadrant pain initially thought to be provoked by diet but ultimately likely secondary to sepsis and pyelonephritis given negative HIDA scan on 07/27/2020.  Patient's urinary culture and speciation was remarkable for E faecalis, initially on Zosyn in the inpatient setting but transition to Augmentin for completion of antibiotic course.  Patient only close follow-up with PCP in the next 3 to 5 days to ensure resolution of symptoms, we discussed that if her symptoms  worsen again she has more pain dysuria fevers or chills she report back to the hospital given concern for antibiotic failure although given speciation this is less likely.  Patient otherwise stable and agreeable for discharge home, follow-up with endocrinology PCP and other specialists as scheduled previously.  No medication changes other than initiation of antibiotic course.  Discharge Diagnoses:  Principal Problem:   Right upper quadrant abdominal pain Active Problems:   Acute pyelonephritis   Diabetes mellitus type 1, controlled, with complications (HCC)   Obesity, Class III, BMI 40-49.9 (morbid obesity) (HCC)   Enterococcus faecalis infection   Essential hypertension   RUQ abdominal pain    Discharge Instructions  Discharge Instructions    Call MD for:  extreme fatigue   Complete by: As directed    Call MD for:  severe uncontrolled pain   Complete by: As directed    Call MD for:  temperature >100.4   Complete by: As directed    Diet - low sodium heart healthy   Complete by: As directed    Increase activity slowly   Complete by: As directed      Allergies as of 07/27/2020      Reactions   Fruit Extracts Itching   All fruits some worse than others   Latex Itching, Swelling   Zinc Itching      Medication List    STOP taking these medications   cephALEXin 500 MG capsule Commonly known as: KEFLEX   HYDROcodone-acetaminophen 10-325 MG tablet Commonly known as: NORCO Replaced by: HYDROcodone-acetaminophen 5-325 MG tablet   oxyCODONE 5 MG immediate release tablet Commonly known as: Roxicodone     TAKE these medications   albuterol 108 (90 Base) MCG/ACT inhaler Commonly known as: VENTOLIN  HFA Inhale 2 puffs into the lungs every 4 (four) hours as needed for wheezing.   amoxicillin-clavulanate 875-125 MG tablet Commonly known as: Augmentin Take 1 tablet by mouth 2 (two) times daily for 10 days.   baclofen 10 MG tablet Commonly known as: LIORESAL Take 10 mg by  mouth 3 (three) times daily as needed for muscle spasms.   ergocalciferol 1.25 MG (50000 UT) capsule Commonly known as: VITAMIN D2 Take 50,000 Units by mouth once a week. Wednesday   ferrous sulfate 325 (65 FE) MG tablet Take 325 mg by mouth daily.   FLUoxetine 40 MG capsule Commonly known as: PROZAC Take 40 mg by mouth every morning.   HumaLOG 100 UNIT/ML injection Generic drug: insulin lispro Inject 100 Units as directed See admin instructions. Inject up to 100 units per day via insulin pump as directed   HYDROcodone-acetaminophen 5-325 MG tablet Commonly known as: NORCO/VICODIN Take 1 tablet by mouth every 6 (six) hours as needed for moderate pain or severe pain. Replaces: HYDROcodone-acetaminophen 10-325 MG tablet   hydroxychloroquine 200 MG tablet Commonly known as: PLAQUENIL TAKE 1 TABLET(200 MG) BY MOUTH TWICE DAILY What changed:   how much to take  how to take this  when to take this  additional instructions   ondansetron 4 MG disintegrating tablet Commonly known as: Zofran ODT Take 1 tablet (4 mg total) by mouth every 8 (eight) hours as needed for up to 15 doses for nausea or vomiting.   prazosin 2 MG capsule Commonly known as: MINIPRESS Take 2 mg by mouth at bedtime.   prednisoLONE acetate 1 % ophthalmic suspension Commonly known as: PRED FORTE Place 1 drop into the left eye See admin instructions. Instill one drop in the left eye four times daily and one drop in the right eye three times daily.   Probiotic Acidophilus Tabs Take 1 tablet by mouth daily.   traZODone 50 MG tablet Commonly known as: DESYREL Take 100 mg by mouth at bedtime as needed for sleep.   triamcinolone cream 0.1 % Commonly known as: KENALOG Apply 1 application topically as needed (dry, itch skin). Apply 1-2 times a day to affected areas of dry itchy skin on the body until clear, then as needed. Not to face.   valsartan 160 MG tablet Commonly known as: DIOVAN Take 160 mg by  mouth daily.       Allergies  Allergen Reactions  . Fruit Extracts Itching    All fruits some worse than others  . Latex Itching and Swelling  . Zinc Itching    Consultations:  None   Procedures/Studies: CT ABDOMEN PELVIS W CONTRAST  Result Date: 07/23/2020 CLINICAL DATA:  Right upper quadrant pain with nausea EXAM: CT ABDOMEN AND PELVIS WITH CONTRAST TECHNIQUE: Multidetector CT imaging of the abdomen and pelvis was performed using the standard protocol following bolus administration of intravenous contrast. CONTRAST:  OMNIPAQUE IOHEXOL 300 MG/ML  SOLN COMPARISON:  None. FINDINGS: Lower chest: No acute abnormality. Hepatobiliary: Focal hypodensity at the falciform ligament, possible focal fat infiltration though somewhat masslike configuration. Pancreas: Unremarkable. No pancreatic ductal dilatation or surrounding inflammatory changes. Spleen: Normal in size without focal abnormality. Adrenals/Urinary Tract: Left adrenal gland is normal. 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 Focal hypodensity within the upper pole right kidney with mild perinephric stranding. No hydronephrosis. The bladder is normal Stomach/Bowel: Postsurgical changes of the stomach. Appendix appears normal. No evidence of bowel wall thickening, distention, or inflammatory changes. Vascular/Lymphatic: No significant  vascular findings are present. No enlarged abdominal or pelvic lymph nodes. Reproductive: Uterus and bilateral adnexa are unremarkable. Other: Negative for free air or free fluid. Musculoskeletal: No acute or significant osseous findings. IMPRESSION: 1. Focal ill-defined hypodensity within the upper pole right kidney with mild perinephric fluid and edema, raises possibility of pyelonephritis, suggest correlation with urinalysis. 2. Focal hypodensity near falciform ligament probably represents focal fat infiltration though slightly masslike configuration. When the patient is  clinically stable and able to follow directions and hold their breath (preferably as an outpatient) further evaluation with dedicated abdominal MRI should be considered. Electronically Signed   By: Jasmine Pang M.D.   On: 07/23/2020 21:26   US RENAL  Result Date: 07/26/2020 CLINICAL DATA:  Pyelonephritis EXAM: RENAL / URINARY TRACT ULTRASOUND COMPLETE COMPARISON:  None. FINDINGS: Right Kidney: Renal measurements: 12.0 x 5.4 x 6.0 cm = volume: 206.4 mL. Echogenicity within normal limits. No renal mass or hydronephrosis visualized. 2.1 x 1.7 cm circumscribed echogenic mass superior to the right kidney. Correlation with prior imaging demonstrates a fat attenuation adrenal lesion consistent with a benign myelolipoma in the same location. Left Kidney: Renal measurements: 12.1 x 5.7 x 5.8 cm = volume: 207 mL. Echogenicity within normal limits. No mass or hydronephrosis visualized. Bladder: Appears normal for degree of bladder distention. Other: None. IMPRESSION: 1. Normal sonographic appearance of the kidneys. 2. Approximately 2 cm right adrenal myelolipoma. Electronically Signed   By: Malachy Moan M.D.   On: 07/26/2020 09:53   NM Hepato W/EF  Result Date: 07/27/2020 CLINICAL DATA:  Biliary colic, recurrent, gallbladder dyskinesia suspected. EXAM: NUCLEAR MEDICINE HEPATOBILIARY IMAGING WITH GALLBLADDER EF TECHNIQUE: Sequential images of the abdomen were obtained out to 60 minutes following intravenous administration of radiopharmaceutical. After oral ingestion of Ensure, gallbladder ejection fraction was determined. At 60 min, normal ejection fraction is greater than 33%. RADIOPHARMACEUTICALS:  5.5 mCi Tc-17m  Choletec IV COMPARISON:  CT and ultrasound Jul 24, 2019 FINDINGS: There is prompt, uniform radiotracer uptake by the liver with normal filling of the intrahepatic ducts, common bile duct. Gallbladder activity is visualized, consistent with patency of cystic duct (normal < 60 minutes). Additionally there  is normal biliary to bowel transit (normal < 60 minutes), consistent with patent common bile duct. Ensure was administered and the gallbladder appears to empty normally on sequential images. Calculated gallbladder ejection fraction is 71%. (Normal gallbladder ejection fraction with Ensure is greater than 33%.) No evidence of enterogastric biliary reflux. IMPRESSION: Normal HIDA scan. Electronically Signed   By: Maudry Mayhew MD   On: 07/27/2020 14:05   US Abdomen Limited  Result Date: 07/23/2020 CLINICAL DATA:  Right upper quadrant pain. EXAM: ULTRASOUND ABDOMEN LIMITED RIGHT UPPER QUADRANT COMPARISON:  None. FINDINGS: Gallbladder: No gallstones or wall thickening visualized (2.5 mm). No positive sonographic Murphy sign noted by sonographer. Common bile duct: Diameter: 3.6 mm Liver: No focal lesion identified. Within normal limits in parenchymal echogenicity. Portal vein is patent on color Doppler imaging with normal direction of blood flow towards the liver. Other: None. IMPRESSION: Positive sonographic Murphy sign in the absence of cholelithiasis. Correlation with a nuclear medicine hepatobiliary scan is recommended, as early acalculous cholecystitis cannot completely be excluded. Electronically Signed   By: Aram Candela M.D.   On: 07/23/2020 19:45     Subjective: No acute issues or events overnight denies nausea vomiting diarrhea constipation headache fevers or chills.  Right upper quadrant abdominal pain appears to be resolving, tolerating p.o. today without any further episodes  Discharge Exam: Vitals:   07/27/20 0609 07/27/20 1346  BP: (!) 146/71 116/61  Pulse: 86 (!) 103  Resp: 19 18  Temp: 98 F (36.7 C)   SpO2: 99% 100%   Vitals:   07/26/20 1228 07/26/20 2048 07/27/20 0609 07/27/20 1346  BP: 129/71 120/64 (!) 146/71 116/61  Pulse: 82 88 86 (!) 103  Resp: 17 19 19 18   Temp: 98.3 F (36.8 C) 98.8 F (37.1 C) 98 F (36.7 C)   TempSrc:  Oral    SpO2: 100% 100% 99% 100%   Weight:      Height:        General: Pt is alert, awake, not in acute distress Cardiovascular: RRR, S1/S2 +, no rubs, no gallops Respiratory: CTA bilaterally, no wheezing, no rhonchi Abdominal: Soft, NT, ND, bowel sounds + Extremities: no edema, no cyanosis    The results of significant diagnostics from this hospitalization (including imaging, microbiology, ancillary and laboratory) are listed below for reference.     Microbiology: Recent Results (from the past 240 hour(s))  Urine culture     Status: Abnormal   Collection Time: 07/23/20  5:29 PM   Specimen: Urine, Clean Catch  Result Value Ref Range Status   Specimen Description   Final    URINE, CLEAN CATCH Performed at [facility], [address], [place], [place]    Special Requests   Final    NONE Performed at [facility], [address], [place], [place]    Culture 60,000 COLONIES/mL ENTEROCOCCUS FAECALIS (A)  Final   Report Status 07/26/2020 FINAL  Final   Organism ID, Bacteria ENTEROCOCCUS FAECALIS (A)  Final      Susceptibility   Enterococcus faecalis - MIC*    AMPICILLIN <=2 SENSITIVE Sensitive     NITROFURANTOIN <=16 SENSITIVE Sensitive     VANCOMYCIN 1 SENSITIVE Sensitive     * 60,000 COLONIES/mL ENTEROCOCCUS FAECALIS  SARS CORONAVIRUS 2 (TAT 6-24 HRS) Nasopharyngeal Nasopharyngeal Swab     Status: None   Collection Time: 07/26/20  1:10 AM   Specimen: Nasopharyngeal Swab  Result Value Ref Range Status   SARS Coronavirus 2 NEGATIVE NEGATIVE Final    Comment: (NOTE) SARS-CoV-2 target nucleic acids are NOT DETECTED.  The SARS-CoV-2 RNA is generally detectable in upper and lower respiratory specimens during the acute phase of infection. Negative results do not preclude SARS-CoV-2 infection, do not rule out co-infections with other pathogens, and should not be used as the sole basis for treatment or other patient management decisions. Negative  results must be combined with clinical observations, patient history, and epidemiological information. The expected result is Negative.  Fact Sheet for Patients: 09/25/20  Fact Sheet for Healthcare Providers: HairSlick.no  This test is not yet approved or cleared by the [place] FDA and  has been authorized for detection and/or diagnosis of SARS-CoV-2 by FDA under an Emergency Use Authorization (EUA). This EUA will remain  in effect (meaning this test can be used) for the duration of the COVID-19 declaration under Se ction 564(b)(1) of the Act, 21 U.S.C. section 360bbb-3(b)(1), unless the authorization is terminated or revoked sooner.  Performed at [facility], [address], [place], [place]      Labs: BNP (last 3 results) No results for input(s): BNP in the last 8760 hours. Basic Metabolic Panel: Recent Labs  Lab 07/23/20 1729 07/25/20 2244 07/27/20 0307  NA 135 135 138  K 3.7 3.8 4.1  CL 101  101 105  CO2 GLUCOSE 247* 124* 50*  BUN CREATININE 0.93 0.84 0.89  CALCIUM 8.5* 8.1* 7.9*   Liver Function Tests: Recent Labs  Lab 07/23/20 1729 07/25/20 2244  AST 10* 11*  ALT 8 12  ALKPHOS 55 48  BILITOT 0.5 0.3  PROT 7.4 6.8  ALBUMIN 3.7 3.0*   Recent Labs  Lab 07/23/20 1729 07/25/20 2244  LIPASE <10* 19   No results for input(s): AMMONIA in the last 168 hours. CBC: Recent Labs  Lab 07/23/20 1729 07/25/20 2244 07/27/20 0307  WBC 10.2 7.9 6.7  NEUTROABS  --  4.6  --   HGB 10.3* 9.4* 8.3*  HCT 32.2* 29.5* 27.1*  MCV 81.5 81.9 84.7  PLT 212 205 189   Cardiac Enzymes: No results for input(s): CKTOTAL, CKMB, CKMBINDEX, TROPONINI in the last 168 hours. BNP: Invalid input(s): POCBNP CBG: Recent Labs  Lab 07/26/20 1649 07/26/20 2049 07/27/20 0734 07/27/20 0820 07/27/20 1238  GLUCAP 88 71 61* 111* 187*   D-Dimer No results for input(s):  DDIMER in the last 72 hours. Hgb A1c No results for input(s): HGBA1C in the last 72 hours. Lipid Profile No results for input(s): CHOL, HDL, LDLCALC, TRIG, CHOLHDL, LDLDIRECT in the last 72 hours. Thyroid function studies No results for input(s): TSH, T4TOTAL, T3FREE, THYROIDAB in the last 72 hours.  Invalid input(s): FREET3 Anemia work up No results for input(s): VITAMINB12, FOLATE, FERRITIN, TIBC, IRON, RETICCTPCT in the last 72 hours. Urinalysis    Component Value Date/Time   COLORURINE YELLOW 07/25/2020 2344   APPEARANCEUR CLEAR 07/25/2020 2344   LABSPEC 1.015 07/25/2020 2344   PHURINE 6.0 07/25/2020 2344   GLUCOSEU NEGATIVE 07/25/2020 2344   HGBUR TRACE (A) 07/25/2020 2344   BILIRUBINUR NEGATIVE 07/25/2020 2344   KETONESUR NEGATIVE 07/25/2020 2344   PROTEINUR NEGATIVE 07/25/2020 2344   UROBILINOGEN 2.0 (H) 03/03/2012 1535   NITRITE NEGATIVE 07/25/2020 2344   LEUKOCYTESUR TRACE (A) 07/25/2020 2344   Sepsis Labs Invalid input(s): PROCALCITONIN,  WBC,  LACTICIDVEN Microbiology Recent Results (from the past 240 hour(s))  Urine culture     Status: Abnormal   Collection Time: 07/23/20  5:29 PM   Specimen: Urine, Clean Catch  Result Value Ref Range Status   Specimen Description   Final    URINE, CLEAN CATCH Performed at [facility], [address], [place], [place] [address]    Special Requests   Final    NONE Performed at [facility], [address], [place], [place] [address]    Culture 60,000 COLONIES/mL ENTEROCOCCUS FAECALIS (A)  Final   Report Status 07/26/2020 FINAL  Final   Organism ID, Bacteria ENTEROCOCCUS FAECALIS (A)  Final      Susceptibility   Enterococcus faecalis - MIC*    AMPICILLIN <=2 SENSITIVE Sensitive     NITROFURANTOIN <=16 SENSITIVE Sensitive     VANCOMYCIN 1 SENSITIVE Sensitive     * 60,000 COLONIES/mL ENTEROCOCCUS FAECALIS  SARS CORONAVIRUS 2 (TAT 6-24 HRS) Nasopharyngeal Nasopharyngeal Swab      Status: None   Collection Time: 07/26/20  1:10 AM   Specimen: Nasopharyngeal Swab  Result Value Ref Range Status   SARS Coronavirus 2 NEGATIVE NEGATIVE Final    Comment: (NOTE) SARS-CoV-2 target nucleic acids are NOT DETECTED.  The SARS-CoV-2 RNA is generally detectable in upper and lower respiratory specimens during the acute phase of infection. Negative results do not preclude SARS-CoV-2 infection, do not rule out co-infections with other  pathogens, and should not be used as the sole basis for treatment or other patient management decisions. Negative results must be combined with clinical observations, patient history, and epidemiological information. The expected result is Negative.  Fact Sheet for Patients: HairSlick.no  Fact Sheet for Healthcare Providers: quierodirigir.com  This test is not yet approved or cleared by the [place] FDA and  has been authorized for detection and/or diagnosis of SARS-CoV-2 by FDA under an Emergency Use Authorization (EUA). This EUA will remain  in effect (meaning this test can be used) for the duration of the COVID-19 declaration under Se ction 564(b)(1) of the Act, 21 U.S.C. section 360bbb-3(b)(1), unless the authorization is terminated or revoked sooner.  Performed at [facility], [address] [place], [place] [address]      Time coordinating discharge: Over 30 minutes  SIGNED:   Azucena Fallen, DO [facility] 07/27/2020, 8:01 PM Pager   If 7PM-7AM, please contact night-coverage [email or web address]

## 2020-07-27 NOTE — Telephone Encounter (Signed)
Patient is an inpatient at [facility] long being treated with antibiotics

## 2020-07-27 NOTE — Telephone Encounter (Signed)
Post ED Visit - Positive Culture Follow-up  Culture report reviewed by antimicrobial stewardship pharmacist: [facility] []  , Pharm.D. []  Enzo Bi, Pharm.D., BCPS AQ-ID []  , Pharm.D., BCPS []  Celedonio Miyamoto, Pharm.D., BCPS []  [place], Garvin Fila.D., BCPS, AAHIVP []  , Pharm.D., BCPS, AAHIVP []  Georgina Pillion, PharmD, BCPS []  , PharmD, BCPS []  [place], PharmD, BCPS []  [place], PharmD []  , PharmD, BCPS []  Estella Husk, PharmD PharmD  [facility] []  , PharmD []  Phillips Climes, PharmD []  , PharmD []  Agapito Games, Rph []  ) Verlan Friends, PharmD []  , PharmD []  Mervyn Gay, PharmD []  , PharmD []  Vinnie Level, PharmD []  Gerrit Halls, PharmD []  Wonda Old[facility], PharmD []  , PharmD []  Len Childs, PharmD   Positive urine culture Treated with cephalexin, organism sensitive to the same and no further patient follow-up is required at this time.  07/27/2020, 3:37 PM

## 2020-07-28 NOTE — Telephone Encounter (Signed)
Thank you for informing me.

## 2020-07-28 NOTE — Telephone Encounter (Signed)
Patient states she is feeling okay and is going to follow up with her PCP.

## 2020-08-06 ENCOUNTER — Other Ambulatory Visit: Payer: Self-pay | Admitting: Pain Medicine

## 2020-08-06 DIAGNOSIS — G8929 Other chronic pain: Secondary | ICD-10-CM

## 2020-08-12 ENCOUNTER — Other Ambulatory Visit: Payer: Self-pay | Admitting: Family Medicine

## 2020-08-12 DIAGNOSIS — R935 Abnormal findings on diagnostic imaging of other abdominal regions, including retroperitoneum: Secondary | ICD-10-CM

## 2020-08-21 ENCOUNTER — Telehealth: Payer: Self-pay

## 2020-08-21 NOTE — Telephone Encounter (Signed)
Patient called requesting a return call regarding the referral Dr. Corliss Skains was making for her.

## 2020-08-21 NOTE — Telephone Encounter (Signed)
We had received a rheumatology referral from patient's PCP and patient is an established patient of Dr. Fatima Sanger. I called the patient on 08/12/2020 and left her a message to clarify the situation (if she was transferring care or needed a sooner appt). Patient returned the call today and states that was an error. Patient states her PCP was referring her to a gastroenterologist and mistakenly referred her to rheumatology. Patient states she plans to continue care at our office and will follow up as scheduled.

## 2020-08-23 ENCOUNTER — Ambulatory Visit
Admission: RE | Admit: 2020-08-23 | Discharge: 2020-08-23 | Disposition: A | Discharge status: Home / Self Care | Payer: Medicaid Other | Source: Ambulatory Visit | Attending: Family Medicine | Admitting: Family Medicine

## 2020-08-23 ENCOUNTER — Other Ambulatory Visit: Payer: Medicaid Other

## 2020-08-23 DIAGNOSIS — R935 Abnormal findings on diagnostic imaging of other abdominal regions, including retroperitoneum: Secondary | ICD-10-CM

## 2020-08-23 MED ORDER — GADOBENATE DIMEGLUMINE 529 MG/ML IV SOLN
20.0000 mL | Freq: Once | INTRAVENOUS | Status: AC | PRN
  Administered 2020-08-23: 20 mL via INTRAVENOUS

## 2020-09-10 NOTE — Progress Notes (Signed)
Office Visit Note  Patient: [name]             Date of Birth: [birth year]           MRN: [id number]             PCP: Jonathon Jordan, MD Referring: Jonathon Jordan, MD Visit Date: 09/24/2020 Occupation: '@GUAROCC' @  Subjective:  Medication management.   History of Present Illness: [name] is a 30 y.o. female with a history of seronegative rheumatoid arthritis, osteoarthritis and myofascial pain syndrome.  She continues to have symptoms of right carpal tunnel syndrome.  She states the brace does not fit her right.  She recently has been experiencing increased pain and discomfort in her right knee joint.  She has been followed by Dr.Spievy.  She is awaiting MRI of her right knee joint.  She states she has tried physical therapy in the past without results.  Her left knee joint causes some discomfort as well.  The lower back pain persists.  She also has discomfort in the trapezius area.  She has been tolerating Plaquenil well.  She had bilateral cataract surgeries in April and May respectively.  Activities of Daily Living:  Patient reports morning stiffness for 30-60 minutes.   Patient Reports nocturnal pain.  Difficulty dressing/grooming: Denies Difficulty climbing stairs: Denies Difficulty getting out of chair: Denies Difficulty using hands for taps, buttons, cutlery, and/or writing: Reports  Review of Systems  Constitutional:  Positive for fatigue.  HENT:  Negative for mouth sores, mouth dryness and nose dryness.   Eyes:  Positive for dryness. Negative for pain and itching.  Respiratory:  Negative for shortness of breath and difficulty breathing.   Cardiovascular:  Negative for chest pain and palpitations.  Gastrointestinal:  Negative for blood in stool, constipation and diarrhea.  Endocrine: Negative for increased urination.  Genitourinary:  Negative for difficulty urinating.  Musculoskeletal:  Positive for joint pain, joint pain, joint swelling, myalgias, morning  stiffness, muscle tenderness and myalgias.  Skin:  Negative for color change, rash, redness and sensitivity to sunlight.  Allergic/Immunologic: Positive for susceptible to infections.  Neurological:  Positive for dizziness, headaches and weakness. Negative for numbness and memory loss.  Hematological:  Positive for bruising/bleeding tendency.  Psychiatric/Behavioral:  Negative for confusion.    PMFS History:  Patient Active Problem List   Diagnosis Date Noted   Intractable abdominal pain 07/26/2020   Right upper quadrant abdominal pain 07/26/2020   Acute pyelonephritis 07/26/2020   Diabetes mellitus type 1, controlled, with complications ([place]) 50/15/8682   Obesity, Class III, BMI 40-49.9 (morbid obesity) ([place]) 07/26/2020   Enterococcus faecalis infection 07/26/2020   Essential hypertension 07/26/2020   RUQ abdominal pain 07/26/2020   Chronic low back pain 11/22/2013   Clinical depression 10/07/2013   Extreme obesity 10/07/2013   Diabetic neuropathy ([place]) 08/06/2013   General patient noncompliance 12/21/2012   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[place]) 12/30/2010   Appetite disorder 12/30/2010   BP (high blood pressure) 12/30/2010   Muscle ache 12/30/2010   Cachectic ([place]) 12/30/2010   Patellofemoral disorder of left knee 09/03/2010   Diabetes mellitus 09/03/2010    Past Medical History:  Diagnosis Date   Anemia    Arthritis    Asthma    Diabetes mellitus without complication ([place])    Hypertension    Vitamin D deficiency     Family History  Problem Relation Age of Onset   Lupus Maternal Grandmother    Asthma Sister    ADD / ADHD  Sister    Past Surgical History:  Procedure Laterality Date   CATARACT EXTRACTION, BILATERAL Bilateral 2022   GASTRIC BYPASS  07/2017   Social History   Social History Narrative   Not on file   Immunization History  Administered Date(s) Administered   PFIZER(Purple Top)SARS-COV-2 Vaccination 05/28/2019, 06/18/2019, 03/06/2020, 09/13/2020      Objective: Vital Signs: BP (!) 148/85 (BP Location: Left Wrist, Patient Position: Sitting, Cuff Size: Normal)   Pulse 86   Ht '5\' 4"'  (1.626 m)   Wt 275 lb (124.7 kg)   BMI 47.20 kg/m    Physical Exam Vitals and nursing note reviewed.  Constitutional:      Appearance: She is well-developed.  HENT:     Head: Normocephalic and atraumatic.  Eyes:     Conjunctiva/sclera: Conjunctivae normal.  Cardiovascular:     Rate and Rhythm: Normal rate and regular rhythm.     Heart sounds: Normal heart sounds.  Pulmonary:     Effort: Pulmonary effort is normal.     Breath sounds: Normal breath sounds.  Abdominal:     General: Bowel sounds are normal.     Palpations: Abdomen is soft.  Musculoskeletal:     Cervical back: Normal range of motion.  Lymphadenopathy:     Cervical: No cervical adenopathy.  Skin:    General: Skin is warm and dry.     Capillary Refill: Capillary refill takes less than 2 seconds.  Neurological:     Mental Status: She is alert and oriented to person, place, and time.  Psychiatric:        Behavior: Behavior normal.     Musculoskeletal Exam: C-spine thoracic and lumbar spine were in good range of motion.  She had bilateral trapezius spasm.  Shoulder joints, elbow joints, wrist joints, MCPs PIPs and DIPs with good range of motion with no synovitis.  Hip joints, knee joints, ankles, MTPs and PIPs with good range of motion with no synovitis.  She had warmth in palpation of her right knee joint.  CDAI Exam: CDAI Score: 1.6  Patient Global: 4 mm; Provider Global: 2 mm Swollen: 0 ; Tender: 1  Joint Exam 09/24/2020      Right  Left  Knee   Tender        Investigation: No additional findings.  Imaging: No results found.  Recent Labs: Lab Results  Component Value Date   WBC 6.7 07/27/2020   HGB 8.3 (L) 07/27/2020   PLT 189 07/27/2020   NA 138 07/27/2020   K 4.1 07/27/2020   CL 105 07/27/2020   CO2 27 07/27/2020   GLUCOSE 50 (L) 07/27/2020   BUN 15  07/27/2020   CREATININE 0.89 07/27/2020   BILITOT 0.3 07/25/2020   ALKPHOS 48 07/25/2020   AST 11 (L) 07/25/2020   ALT 12 07/25/2020   PROT 6.8 07/25/2020   ALBUMIN 3.0 (L) 07/25/2020   CALCIUM 7.9 (L) 07/27/2020   GFRAA 139 04/17/2020    Speciality Comments: PLQ eye exam: 01/28/2020 normal @ [facility]. Follow up in 1 year.  Procedures:  No procedures performed Allergies: Fruit extracts, Latex, and Zinc   Assessment / Plan:     Visit Diagnoses: Seronegative rheumatoid arthritis ([place]) - U/S+ syonvitis Bilateral wrists/left MCP, RF-, CCP-, 14-3-3 eta negative: She had no synovitis on examination today.  She has been tolerating hydroxychloroquine well.  She had some warmth on palpation of her right knee joint.  She is awaiting MRI of her right knee joint which has been  ordered by pain management.  High risk medication use - Plaquenil 200 mg 1 tablet by mouth twice daily. PLQ eye exam: 01/28/2020.  Patient states she had recent eye examination.  She will send results to [name].  I reviewed lab results from May 2022 she was very anemic at hemoglobin of 8.3.  She states it was close to her surgery.  She is denies having heavy periods.  I will refer her to hematology for further evaluation.  Carpal tunnel syndrome, right upper limb-she has intermittent symptoms.  She has not been using brace on a regular basis.  I have encouraged use of brace.  Patellofemoral disorder of left knee-she has been doing some stretching exercises.  Chronic pain of right knee-she complains of increased pain and discomfort in her right knee joint.  She states Dr.Spivey ordered MRI of her right knee joint.  Chronic SI joint pain-she continues to have some SI joint pain.  Spondylolysis, lumbar region-she gives history of lower back pain.  Stretching exercises were emphasized.  Positive ANA (antinuclear antibody) - 06/17/2020 ANA negative, complements within normal limits, ESR within normal limits,  double-stranded DNA negative, CK within normal limits.   Trapezius muscle spasm-she continues to have some trapezius spasm.  Myofascial pain-she has generalized pain and discomfort.  Chronic pain syndrome - She is taking hydrocodone-acetaminophen 10-325 mg every 4 hours as needed for pain relief.   Other medical problems are listed as follows:  Other fatigue  Vitamin B12 deficiency  Vitamin D deficiency  History of iron deficiency anemia-referred to hematology.  History of depression  Diabetic gastroparesis ([place])  History of asthma  Type 1 diabetes mellitus with diabetic polyneuropathy (HCC)-patient recently had bilateral cataract surgery in April.  History of gastroesophageal reflux (GERD)  Family history of systemic lupus erythematosus  Orders: Orders Placed This Encounter  Procedures   Ambulatory referral to Hematology / Oncology    No orders of the defined types were placed in this encounter.    Follow-Up Instructions: Return in about 5 months (around 02/24/2021) for Rheumatoid arthritis.   Bo Merino, MD  Note - This record has been created using Editor, commissioning.  Chart creation errors have been sought, but may not always  have been located. Such creation errors do not reflect on  the standard of medical care.

## 2020-09-13 IMAGING — MR MRI CERVICAL SPINE WITHOUT CONTRAST
4 of 5 series · 27 of 48 positions shown · non-contrast
Comparison: None.

CLINICAL DATA: Pain and numbness in the shoulders and left thumb.

EXAM:
MRI CERVICAL, THORACIC AND LUMBAR SPINE WITHOUT CONTRAST
TECHNIQUE: Multiplanar and multiecho pulse sequences of the cervical spine, to
include the craniocervical junction and cervicothoracic junction and
thoracic spine were obtained without intravenous contrast.

[Series 6: T1 · sagittal · 3.0mm · 0.66mm/px · 6 of 15 slices shown]
[im 1/15]
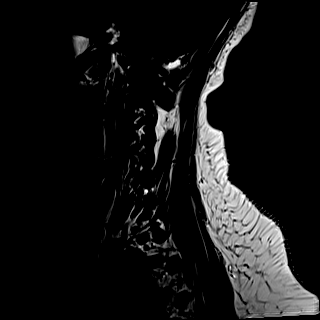
[im 3/15]
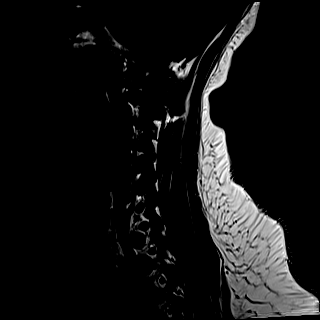
[im 6/15]
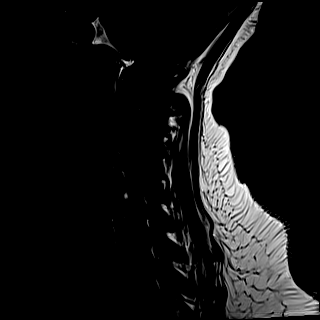
[im 9/15]
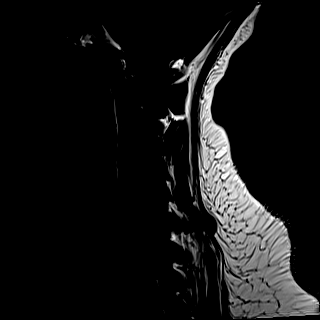
[im 12/15]
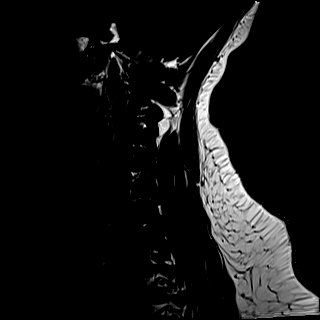
[im 15/15]
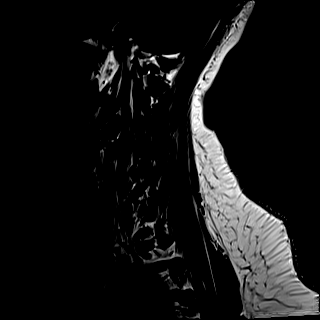

[Series 7: T2 · sagittal · 3.0mm · 0.55mm/px · 7 of 15 slices shown (1 of 2)]
[im 1/15]
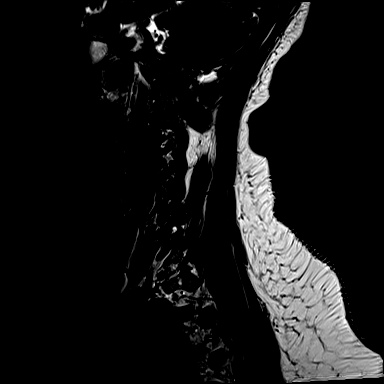
[im 3/15]
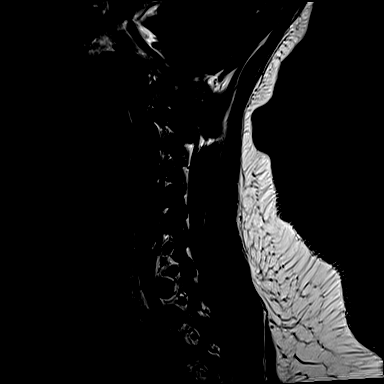
[im 5/15]
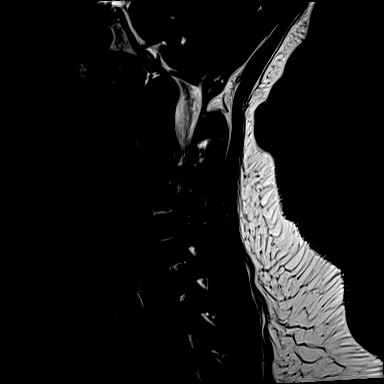
[im 8/15]
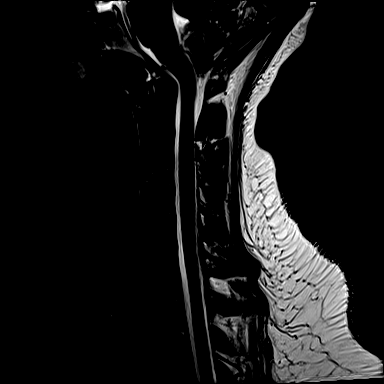
[im 10/15]
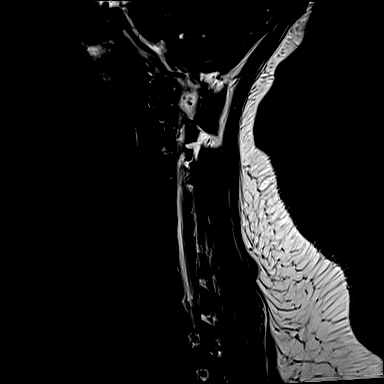
[im 12/15]
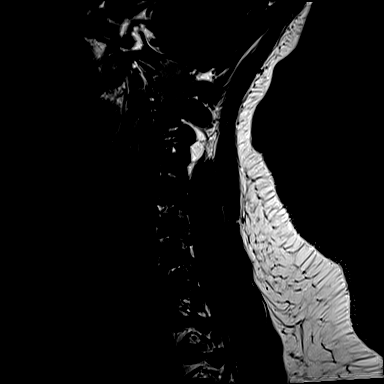
[im 15/15]
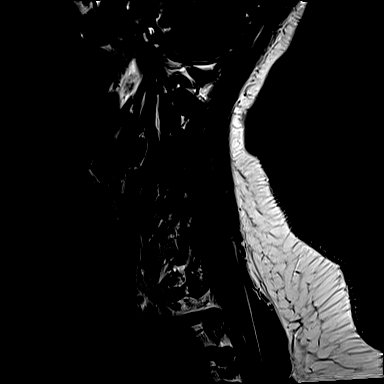

[Series 8: STIR · sagittal · 3.0mm · 0.33mm/px · 6 of 15 slices shown]
[im 1/15]
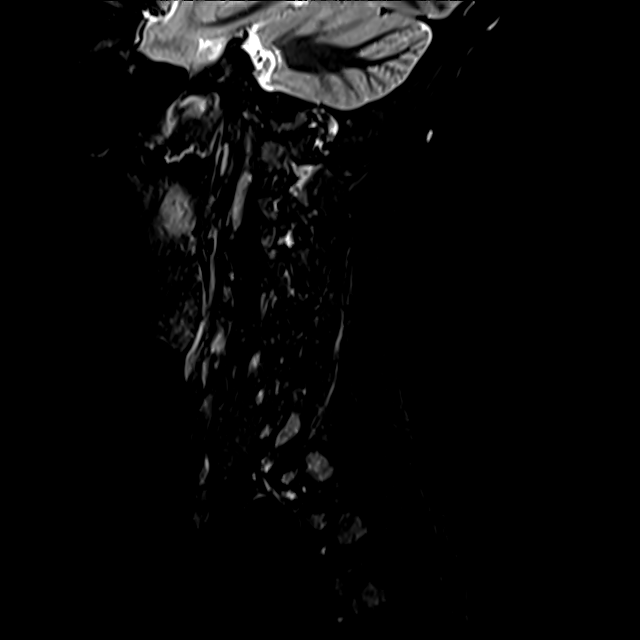
[im 3/15]
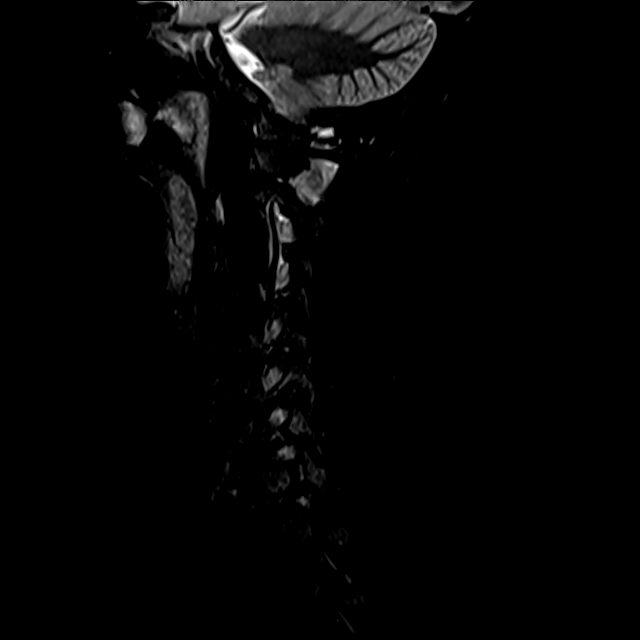
[im 5/15]
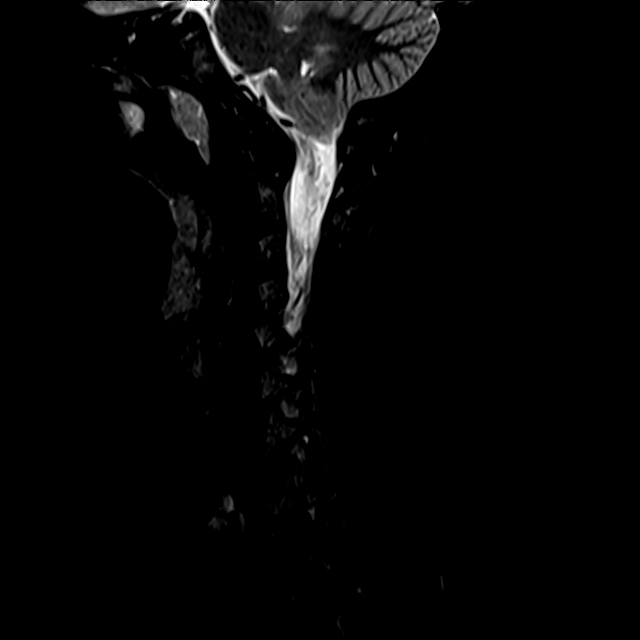
[im 8/15]
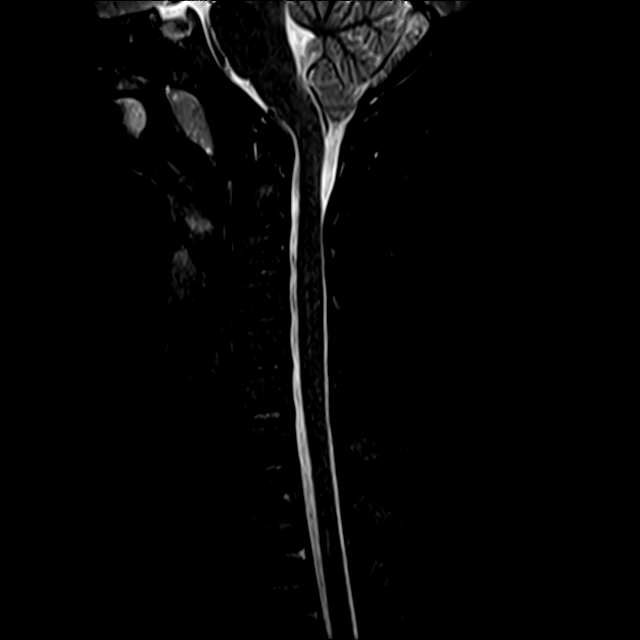
[im 10/15]
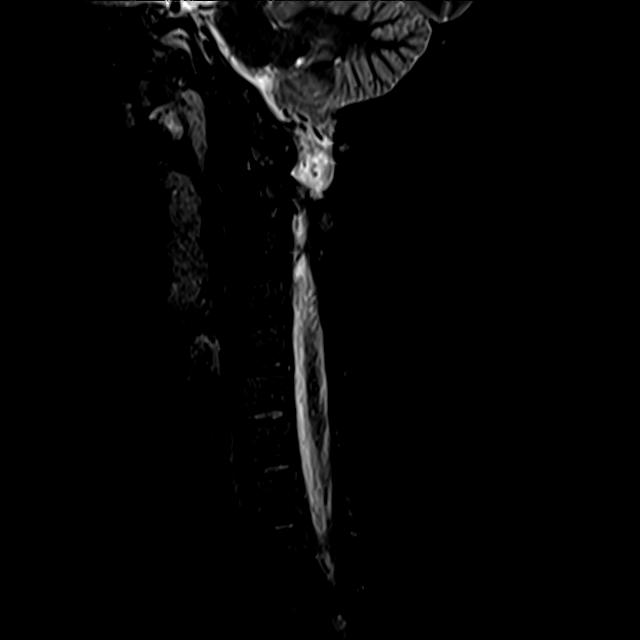
[im 12/15]
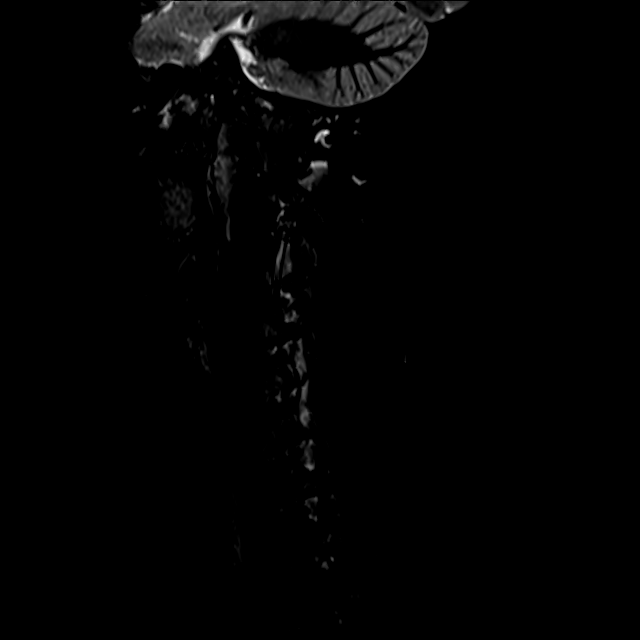

[Series 9: T2 · axial · 3.0mm · 0.62mm/px · z∈[-29,+67]mm · 8 of 30 slices shown (2 of 2)]
[im 1/30]
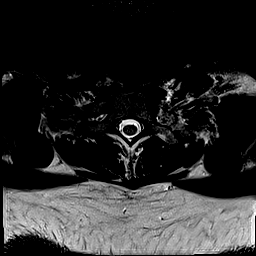
[im 5/30]
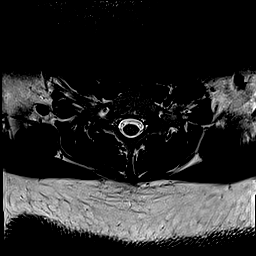
[im 9/30]
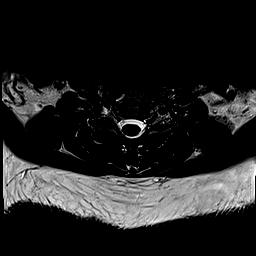
[im 14/30]
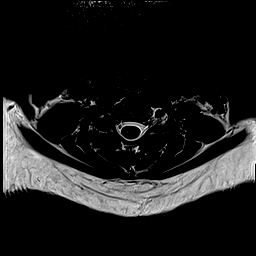
[im 16/30]
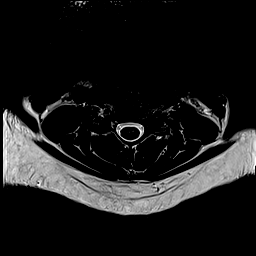
[im 21/30]
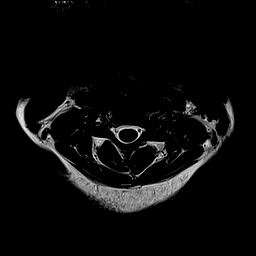
[im 25/30]
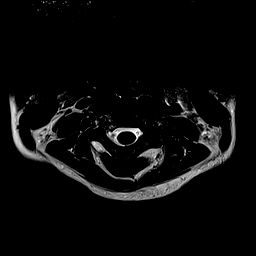
[im 30/30]
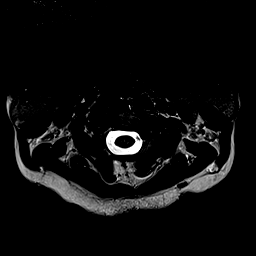

[27 of 48 positions shown; findings below may reference images not displayed]

FINDINGS: MRI CERVICAL SPINE FINDINGS

Alignment: Physiologic.

Vertebrae: No fracture, evidence of discitis, or bone lesion.

Cord: Normal signal and morphology.

Posterior Fossa, vertebral arteries, paraspinal tissues: Posterior
fossa demonstrates no focal abnormality. Vertebral artery flow voids
are maintained. Paraspinal soft tissues are unremarkable.

Disc levels:

Discs: Disc spaces are maintained.

C2-3: No significant disc bulge. No neural foraminal stenosis. No
central canal stenosis.

C3-4: No significant disc bulge. No neural foraminal stenosis. No
central canal stenosis.

C4-5: No significant disc bulge. No neural foraminal stenosis. No
central canal stenosis.

C5-6: No significant disc bulge. No neural foraminal stenosis. No
central canal stenosis.

C6-7: No significant disc bulge. No neural foraminal stenosis. No
central canal stenosis.

C7-T1: No significant disc bulge. No neural foraminal stenosis. No
central canal stenosis.

MRI THORACIC SPINE FINDINGS

Alignment:  Physiologic.

Vertebrae: No fracture, evidence of discitis, or bone lesion.

Cord:  Normal signal and morphology.

Paraspinal and other soft tissues: No acute paraspinal abnormality.

Disc levels:

Disc spaces:  Disc spaces are maintained.

T1-T2: No disc protrusion, foraminal stenosis or central canal
stenosis.

T2-T3: No disc protrusion, foraminal stenosis or central canal
stenosis.

T3-T4: No disc protrusion, foraminal stenosis or central canal
stenosis.

T4-T5: No disc protrusion, foraminal stenosis or central canal
stenosis.

T5-T6: No disc protrusion, foraminal stenosis or central canal
stenosis.

T6-T7: No disc protrusion, foraminal stenosis or central canal
stenosis.

T7-T8: No disc protrusion, foraminal stenosis or central canal
stenosis.

T8-T9: No disc protrusion, foraminal stenosis or central canal
stenosis.

T9-T10: No disc protrusion, foraminal stenosis or central canal
stenosis.

T10-T11: No disc protrusion, foraminal stenosis or central canal
stenosis.

T11-T12: No disc protrusion, foraminal stenosis or central canal
stenosis.
IMPRESSION: 1. Normal MRI of the cervical spine.
2. Normal MRI of the thoracic spine.

## 2020-09-24 ENCOUNTER — Encounter: Payer: Self-pay | Admitting: Rheumatology

## 2020-09-24 ENCOUNTER — Other Ambulatory Visit: Payer: Self-pay

## 2020-09-24 ENCOUNTER — Ambulatory Visit: Payer: Medicaid Other | Admitting: Rheumatology

## 2020-09-24 VITALS — BP 148/85 | HR 86 | Ht 64.0 in | Wt 275.0 lb

## 2020-09-24 DIAGNOSIS — E559 Vitamin D deficiency, unspecified: Secondary | ICD-10-CM

## 2020-09-24 DIAGNOSIS — E538 Deficiency of other specified B group vitamins: Secondary | ICD-10-CM

## 2020-09-24 DIAGNOSIS — M4306 Spondylolysis, lumbar region: Secondary | ICD-10-CM

## 2020-09-24 DIAGNOSIS — Z862 Personal history of diseases of the blood and blood-forming organs and certain disorders involving the immune mechanism: Secondary | ICD-10-CM

## 2020-09-24 DIAGNOSIS — M222X2 Patellofemoral disorders, left knee: Secondary | ICD-10-CM | POA: Diagnosis not present

## 2020-09-24 DIAGNOSIS — G5601 Carpal tunnel syndrome, right upper limb: Secondary | ICD-10-CM

## 2020-09-24 DIAGNOSIS — G8929 Other chronic pain: Secondary | ICD-10-CM

## 2020-09-24 DIAGNOSIS — M62838 Other muscle spasm: Secondary | ICD-10-CM

## 2020-09-24 DIAGNOSIS — Z8709 Personal history of other diseases of the respiratory system: Secondary | ICD-10-CM

## 2020-09-24 DIAGNOSIS — E1042 Type 1 diabetes mellitus with diabetic polyneuropathy: Secondary | ICD-10-CM

## 2020-09-24 DIAGNOSIS — Z8659 Personal history of other mental and behavioral disorders: Secondary | ICD-10-CM

## 2020-09-24 DIAGNOSIS — M533 Sacrococcygeal disorders, not elsewhere classified: Secondary | ICD-10-CM

## 2020-09-24 DIAGNOSIS — K3184 Gastroparesis: Secondary | ICD-10-CM

## 2020-09-24 DIAGNOSIS — M25561 Pain in right knee: Secondary | ICD-10-CM

## 2020-09-24 DIAGNOSIS — Z79899 Other long term (current) drug therapy: Secondary | ICD-10-CM | POA: Diagnosis not present

## 2020-09-24 DIAGNOSIS — R5383 Other fatigue: Secondary | ICD-10-CM

## 2020-09-24 DIAGNOSIS — Z8719 Personal history of other diseases of the digestive system: Secondary | ICD-10-CM

## 2020-09-24 DIAGNOSIS — M0600 Rheumatoid arthritis without rheumatoid factor, unspecified site: Secondary | ICD-10-CM | POA: Diagnosis not present

## 2020-09-24 DIAGNOSIS — M7918 Myalgia, other site: Secondary | ICD-10-CM

## 2020-09-24 DIAGNOSIS — E1143 Type 2 diabetes mellitus with diabetic autonomic (poly)neuropathy: Secondary | ICD-10-CM

## 2020-09-24 DIAGNOSIS — G894 Chronic pain syndrome: Secondary | ICD-10-CM

## 2020-09-24 DIAGNOSIS — R768 Other specified abnormal immunological findings in serum: Secondary | ICD-10-CM

## 2020-09-24 DIAGNOSIS — Z8269 Family history of other diseases of the musculoskeletal system and connective tissue: Secondary | ICD-10-CM

## 2020-09-24 NOTE — Patient Instructions (Signed)
Standing Labs We placed an order today for your standing lab work.   Please have your standing labs drawn in October   If possible, please have your labs drawn 2 weeks prior to your appointment so that the provider can discuss your results at your appointment.  Please note that you may see your imaging and lab results in MyChart before we have reviewed them. We may be awaiting multiple results to interpret others before contacting you. Please allow our office up to 72 hours to thoroughly review all of the results before contacting the office for clarification of your results.  We have open lab daily: Monday through Thursday from 1:30-4:30 PM and Friday from 1:30-4:00 PM at the office of Dr. Pollyann Savoy, [facility].   Please be advised, all patients with office appointments requiring lab work will take precedent over walk-in lab work.  If possible, please come for your lab work on Monday and Friday afternoons, as you may experience shorter wait times. The office is located at [address], [place], [place] [address] No appointment is necessary.   Labs are drawn by [facility]. Please bring your co-pay at the time of your lab draw.  You may receive a bill from [facility] for your lab work.  If you wish to have your labs drawn at another location, please call the office 24 hours in advance to send orders.  If you have any questions regarding directions or hours of operation,  please call [phone].   As a reminder, please drink plenty of water prior to coming for your lab work. Thanks!

## 2020-09-25 ENCOUNTER — Telehealth: Payer: Self-pay | Admitting: Hematology and Oncology

## 2020-09-25 NOTE — Telephone Encounter (Signed)
Received a new hem referral from Dr. Corliss Skains for hx of anemia. Ms. Herling has been cld and scheduled to see Dr. Al Pimple on 6/29 at 10am. Pt aware to arrive 20 minutes early.

## 2020-09-30 ENCOUNTER — Ambulatory Visit
Admission: RE | Admit: 2020-09-30 | Discharge: 2020-09-30 | Disposition: A | Discharge status: Home / Self Care | Payer: Medicaid Other | Source: Ambulatory Visit | Attending: Pain Medicine | Admitting: Pain Medicine

## 2020-09-30 ENCOUNTER — Other Ambulatory Visit: Payer: Self-pay

## 2020-09-30 DIAGNOSIS — G8929 Other chronic pain: Secondary | ICD-10-CM

## 2020-10-06 ENCOUNTER — Inpatient Hospital Stay: Payer: Medicaid Other | Attending: Hematology and Oncology | Admitting: Hematology and Oncology

## 2020-10-06 ENCOUNTER — Other Ambulatory Visit: Payer: Self-pay

## 2020-10-06 ENCOUNTER — Encounter: Payer: Self-pay | Admitting: Hematology and Oncology

## 2020-10-06 ENCOUNTER — Inpatient Hospital Stay: Payer: Medicaid Other

## 2020-10-06 VITALS — BP 144/87 | HR 91 | Temp 98.4°F | Resp 18 | Wt 267.6 lb

## 2020-10-06 DIAGNOSIS — D649 Anemia, unspecified: Secondary | ICD-10-CM | POA: Diagnosis not present

## 2020-10-06 DIAGNOSIS — Z79899 Other long term (current) drug therapy: Secondary | ICD-10-CM | POA: Diagnosis not present

## 2020-10-06 DIAGNOSIS — M069 Rheumatoid arthritis, unspecified: Secondary | ICD-10-CM | POA: Insufficient documentation

## 2020-10-06 DIAGNOSIS — Z9884 Bariatric surgery status: Secondary | ICD-10-CM | POA: Insufficient documentation

## 2020-10-06 DIAGNOSIS — E108 Type 1 diabetes mellitus with unspecified complications: Secondary | ICD-10-CM | POA: Insufficient documentation

## 2020-10-06 DIAGNOSIS — D500 Iron deficiency anemia secondary to blood loss (chronic): Secondary | ICD-10-CM

## 2020-10-06 LAB — CBC WITH DIFFERENTIAL/PLATELET
Abs Immature Granulocytes: 0.01 10*3/uL (ref 0.00–0.07)
Basophils Absolute: 0 10*3/uL (ref 0.0–0.1)
Basophils Relative: 1 %
Eosinophils Absolute: 0.1 10*3/uL (ref 0.0–0.5)
Eosinophils Relative: 1 %
HCT: 30.4 % — ABNORMAL LOW (ref 36.0–46.0)
Hemoglobin: 9.8 g/dL — ABNORMAL LOW (ref 12.0–15.0)
Immature Granulocytes: 0 %
Lymphocytes Relative: 28 %
Lymphs Abs: 1.5 10*3/uL (ref 0.7–4.0)
MCH: 26.4 pg (ref 26.0–34.0)
MCHC: 32.2 g/dL (ref 30.0–36.0)
MCV: 81.9 fL (ref 80.0–100.0)
Monocytes Absolute: 0.4 10*3/uL (ref 0.1–1.0)
Monocytes Relative: 8 %
Neutro Abs: 3.4 10*3/uL (ref 1.7–7.7)
Neutrophils Relative %: 62 %
Platelets: 192 10*3/uL (ref 150–400)
RBC: 3.71 MIL/uL — ABNORMAL LOW (ref 3.87–5.11)
RDW: 13.8 % (ref 11.5–15.5)
WBC: 5.5 10*3/uL (ref 4.0–10.5)
nRBC: 0 % (ref 0.0–0.2)

## 2020-10-06 LAB — COMPREHENSIVE METABOLIC PANEL
ALT: 7 U/L (ref 0–44)
AST: 10 U/L — ABNORMAL LOW (ref 15–41)
Albumin: 3.4 g/dL — ABNORMAL LOW (ref 3.5–5.0)
Alkaline Phosphatase: 66 U/L (ref 38–126)
Anion gap: 9 (ref 5–15)
BUN: 11 mg/dL (ref 6–20)
CO2: 24 mmol/L (ref 22–32)
Calcium: 8.7 mg/dL — ABNORMAL LOW (ref 8.9–10.3)
Chloride: 102 mmol/L (ref 98–111)
Creatinine, Ser: 1.01 mg/dL — ABNORMAL HIGH (ref 0.44–1.00)
GFR, Estimated: >60 mL/min (ref >60)
Glucose, Bld: 454 mg/dL — ABNORMAL HIGH (ref 70–99)
Potassium: 4.1 mmol/L (ref 3.5–5.1)
Sodium: 135 mmol/L (ref 135–145)
Total Bilirubin: 0.9 mg/dL (ref 0.3–1.2)
Total Protein: 7.2 g/dL (ref 6.5–8.1)

## 2020-10-06 LAB — IRON AND TIBC
Iron: 55 ug/dL (ref 41–142)
Saturation Ratios: 19 % — ABNORMAL LOW (ref 21–57)
TIBC: 291 ug/dL (ref 236–444)
UIBC: 236 ug/dL (ref 120–384)

## 2020-10-06 LAB — RETICULOCYTES
Immature Retic Fract: 11.6 % (ref 2.3–15.9)
RBC.: 3.8 MIL/uL — ABNORMAL LOW (ref 3.87–5.11)
Retic Count, Absolute: 61.9 10*3/uL (ref 19.0–186.0)
Retic Ct Pct: 1.6 % (ref 0.4–3.1)

## 2020-10-06 LAB — LACTATE DEHYDROGENASE: LDH: 129 U/L (ref 98–192)

## 2020-10-06 LAB — VITAMIN B12: Vitamin B-12: 174 pg/mL — ABNORMAL LOW (ref 180–914)

## 2020-10-06 LAB — FERRITIN: Ferritin: 38 ng/mL (ref 11–307)

## 2020-10-06 NOTE — Progress Notes (Signed)
[facility] CONSULT NOTE  Patient Care Team: [name], MD as PCP - General (Family Medicine)  CHIEF COMPLAINTS/PURPOSE OF CONSULTATION:  Anemia, unspecified, initial consultation  ASSESSMENT & PLAN:   This is a very pleasant 30 year old female patient with juvenile diabetes, rheumatoid arthritis on Plaquenil, hypertension, obesity status post gastric sleeve surgery referred to hematology for evaluation of anemia.  Patient arrived to the appointment today with her family.  She complains of feeling fatigued and cold, has been taking oral iron supplementation for couple years without any improvement. Physical examination unremarkable, no palpable lymphadenopathy or hepatosplenomegaly. I reviewed pertinent records, she was severely anemic back in May but I did not have any recent iron panel and ferritin to compare.  It looks like her baseline hemoglobin is around 10 to 11 g/dL. I have recommended that we proceed with evaluation, discussed about common causes of anemia including hemolysis, iron deficiency which is likely from blood loss anemia in the abdomen, malabsorption and rarely bone marrow disorders.  Given normal white cell line platelet count, I do not have high suspicion for a bone marrow disorder as a primary cause for her anemia. She expressed understanding of the plan and will return to clinic in 4 weeks, proceed with labs today. Thank you for consulting [name] in the care of this patient.  Please not hesitate to contact us with any additional questions or concerns.  If she does appear to have severe iron deficiency, despite taking oral iron supplementation, we have discussed about proceeding with intravenous iron.  She is agreeable to this.  HISTORY OF PRESENTING ILLNESS:   [name] 30 y.o. female is here because of anemia.  This is a very pleasant 30 year old female patient with past medical history significant for juvenile diabetes, rheumatoid arthritis on  Plaquenil, gastric sleeve surgery referred to hematology for evaluation of anemia.  Patient arrived to the appointment today with her family.  She mentions that she always feels tired and cold, craves ice.  She has been taking oral iron once a day for the past 2 years.  She has never needed any blood transfusion or iron infusion although there have been discussion about this.  Her PCP was wondering if this is all purely iron deficiency versus any other reasons for her anemia.  She denies any hematochezia or melena or other change in bowel habits. She does have menstrual cycles which are regular, intermittently heavy, previously used to be heavier. No other hematuria or any other source of bleeding that we discussed about.  She used to take some antacids and proton pump inhibitors a couple years ago but has not been taking them on a regular basis now. Family history of hemolytic anemia in grandfather, G6PD deficiency, no known thalassemia trait or sickle cell trait. Rest of the pertinent 10 point ROS reviewed and negative.  MEDICAL HISTORY:  Past Medical History:  Diagnosis Date   Anemia    Arthritis    Asthma    Diabetes mellitus without complication (HCC)    Hypertension    Vitamin D deficiency     SURGICAL HISTORY: Past Surgical History:  Procedure Laterality Date   CATARACT EXTRACTION, BILATERAL Bilateral 2022   GASTRIC BYPASS  07/2017    SOCIAL HISTORY: Social History   Socioeconomic History   Marital status: Single    Spouse name: Not on file   Number of children: Not on file   Years of education: Not on file   Highest education level: Not on  file  Occupational History   Not on file  Tobacco Use   Smoking status: Never   Smokeless tobacco: Never  Vaping Use   Vaping Use: Never used  Substance and Sexual Activity   Alcohol use: Yes    Comment: occ   Drug use: Never   Sexual activity: Yes    Birth control/protection: None  Other Topics Concern   Not on file  Social  History Narrative   Not on file   Social Determinants of Health   Financial Resource Strain: Not on file  Food Insecurity: Not on file  Transportation Needs: Not on file  Physical Activity: Not on file  Stress: Not on file  Social Connections: Not on file  Intimate Partner Violence: Not on file    FAMILY HISTORY: Family History  Problem Relation Age of Onset   Lupus Maternal Grandmother    Asthma Sister    ADD / ADHD Sister     ALLERGIES:  is allergic to fruit extracts, latex, and zinc.  MEDICATIONS:  Current Outpatient Medications  Medication Sig Dispense Refill   albuterol (PROVENTIL HFA;VENTOLIN HFA) 108 (90 Base) MCG/ACT inhaler Inhale 2 puffs into the lungs every 4 (four) hours as needed for wheezing.     baclofen (LIORESAL) 10 MG tablet Take 10 mg by mouth 3 (three) times daily as needed for muscle spasms.     ergocalciferol (VITAMIN D2) 1.25 MG (50000 UT) capsule Take 50,000 Units by mouth once a week. Wednesday     ferrous sulfate 325 (65 FE) MG tablet Take 325 mg by mouth daily.     FLUoxetine (PROZAC) 40 MG capsule Take 40 mg by mouth every morning.     HUMALOG 100 UNIT/ML injection Inject 100 Units as directed See admin instructions. Inject up to 100 units per day via insulin pump as directed  9   HYDROcodone-acetaminophen (NORCO/VICODIN) 5-325 MG tablet Take 1 tablet by mouth every 6 (six) hours as needed for moderate pain or severe pain. 30 tablet 0   hydroxychloroquine (PLAQUENIL) 200 MG tablet TAKE 1 TABLET(200 MG) BY MOUTH TWICE DAILY (Patient taking differently: Take 200 mg by mouth 2 (two) times daily.) 180 tablet 0   Lactobacillus (PROBIOTIC ACIDOPHILUS) TABS Take 1 tablet by mouth daily.     ondansetron (ZOFRAN ODT) 4 MG disintegrating tablet Take 1 tablet (4 mg total) by mouth every 8 (eight) hours as needed for up to 15 doses for nausea or vomiting. 15 tablet 0   prazosin (MINIPRESS) 2 MG capsule Take 2 mg by mouth at bedtime.     traZODone (DESYREL) 50 MG  tablet Take 100 mg by mouth at bedtime as needed for sleep.     triamcinolone cream (KENALOG) 0.1 % Apply 1 application topically as needed (dry, itch skin). Apply 1-2 times a day to affected areas of dry itchy skin on the body until clear, then as needed. Not to face.     valsartan (DIOVAN) 160 MG tablet Take 160 mg by mouth daily.     No current facility-administered medications for this visit.     PHYSICAL EXAMINATION:  ECOG PERFORMANCE STATUS: 0 - Asymptomatic  Vitals:   10/06/20 1012  BP: (!) 144/87  Pulse: 91  Resp: 18  Temp: 98.4 F (36.9 C)  SpO2: 100%   Filed Weights   10/06/20 1012  Weight: 267 lb 9.6 oz (121.4 kg)    GENERAL:alert, no distress and comfortable, obese. SKIN: skin color, texture, turgor are normal, no rashes or significant  lesions EYES: normal, conjunctiva are pink and non-injected, sclera clear OROPHARYNX:no exudate, no erythema and lips, buccal mucosa, and tongue normal  NECK: supple, thyroid normal size, non-tender, without nodularity LYMPH:  no palpable lymphadenopathy in the cervical, axillary LUNGS: clear to auscultation and percussion with normal breathing effort HEART: regular rate & rhythm and no murmurs and no lower extremity edema ABDOMEN:abdomen soft, non-tender and normal bowel sounds, no splenomegaly Musculoskeletal:no cyanosis of digits and no clubbing  PSYCH: alert & oriented x 3 with fluent speech NEURO: no focal motor/sensory deficits  LABORATORY DATA:  I have reviewed the data as listed Lab Results  Component Value Date   WBC 6.7 07/27/2020   HGB 8.3 (L) 07/27/2020   HCT 27.1 (L) 07/27/2020   MCV 84.7 07/27/2020   PLT 189 07/27/2020     Chemistry      Component Value Date/Time   NA 138 07/27/2020 0307   K 4.1 07/27/2020 0307   CL 105 07/27/2020 0307   CO2 27 07/27/2020 0307   BUN 15 07/27/2020 0307   CREATININE 0.89 07/27/2020 0307   CREATININE 0.67 04/17/2020 0843      Component Value Date/Time   CALCIUM 7.9 (L)  07/27/2020 0307   ALKPHOS 48 07/25/2020 2244   AST 11 (L) 07/25/2020 2244   ALT 12 07/25/2020 2244   BILITOT 0.3 07/25/2020 2244       RADIOGRAPHIC STUDIES: I have personally reviewed the radiological images as listed and agreed with the findings in the report. MR KNEE RIGHT WO CONTRAST  Result Date: 10/01/2020 CLINICAL DATA:  Chronic right knee pain.  No prior surgery. EXAM: MRI OF THE RIGHT KNEE WITHOUT CONTRAST TECHNIQUE: Multiplanar, multisequence MR imaging of the knee was performed. No intravenous contrast was administered. COMPARISON:  MRI right knee dated August 29, 2013. FINDINGS: MENISCI Medial meniscus: Continued degenerative signal within the posterior horn without discrete tear. Lateral meniscus: Small radial and horizontal tear of the mid body again noted. The horizontal component now extends into the anterior horn. LIGAMENTS Cruciates:  Intact ACL and PCL. Collaterals: Medial collateral ligament is intact. Lateral collateral ligament complex is intact. CARTILAGE Patellofemoral:  No chondral defect. Medial:  No chondral defect. Lateral:  No chondral defect. Joint:  No joint effusion. Normal Hoffa's fat. No plical thickening. Popliteal Fossa:  No Baker cyst. Intact popliteus tendon. Extensor Mechanism: Intact quadriceps tendon and patellar tendon. Intact medial and lateral patellar retinaculum. Intact MPFL. Bones: No focal marrow signal abnormality. No fracture or dislocation. Other: None. IMPRESSION: 1. Small radial and horizontal tear of the lateral meniscus mid body again noted. The horizontal component now extends into the anterior horn. 2. Continued degenerative signal within the medial meniscus posterior horn without discrete tear. Electronically Signed   By: Obie Dredge M.D.   On: 10/01/2020 11:36    All questions were answered. The patient knows to call the clinic with any problems, questions or concerns. I spent 45 minutes in the care of this patient including H and P, review  of records, counseling and coordination of care.     Rachel Moulds, MD 10/06/2020 10:14 AM

## 2020-10-07 ENCOUNTER — Encounter (HOSPITAL_COMMUNITY): Payer: Self-pay | Admitting: Emergency Medicine

## 2020-10-07 ENCOUNTER — Other Ambulatory Visit: Payer: Self-pay

## 2020-10-07 ENCOUNTER — Emergency Department (HOSPITAL_COMMUNITY): Payer: Medicaid Other | Attending: Student

## 2020-10-07 ENCOUNTER — Emergency Department (HOSPITAL_COMMUNITY)
Admission: EM | Admit: 2020-10-07 | Discharge: 2020-10-08 | Disposition: A | Discharge status: Home / Self Care | Payer: Worker's Compensation | Attending: Emergency Medicine | Admitting: Emergency Medicine

## 2020-10-07 ENCOUNTER — Emergency Department (HOSPITAL_COMMUNITY): Payer: Medicaid Other

## 2020-10-07 DIAGNOSIS — I10 Essential (primary) hypertension: Secondary | ICD-10-CM | POA: Insufficient documentation

## 2020-10-07 DIAGNOSIS — Z794 Long term (current) use of insulin: Secondary | ICD-10-CM | POA: Diagnosis not present

## 2020-10-07 DIAGNOSIS — R519 Headache, unspecified: Secondary | ICD-10-CM

## 2020-10-07 DIAGNOSIS — E119 Type 2 diabetes mellitus without complications: Secondary | ICD-10-CM | POA: Diagnosis not present

## 2020-10-07 DIAGNOSIS — W228XXA Striking against or struck by other objects, initial encounter: Secondary | ICD-10-CM | POA: Diagnosis not present

## 2020-10-07 DIAGNOSIS — S0003XA Contusion of scalp, initial encounter: Secondary | ICD-10-CM | POA: Insufficient documentation

## 2020-10-07 DIAGNOSIS — Z91040 Latex allergy status: Secondary | ICD-10-CM | POA: Diagnosis not present

## 2020-10-07 DIAGNOSIS — N9489 Other specified conditions associated with female genital organs and menstrual cycle: Secondary | ICD-10-CM | POA: Insufficient documentation

## 2020-10-07 DIAGNOSIS — Y990 Civilian activity done for income or pay: Secondary | ICD-10-CM | POA: Insufficient documentation

## 2020-10-07 DIAGNOSIS — J45909 Unspecified asthma, uncomplicated: Secondary | ICD-10-CM | POA: Insufficient documentation

## 2020-10-07 DIAGNOSIS — S0990XA Unspecified injury of head, initial encounter: Secondary | ICD-10-CM | POA: Diagnosis present

## 2020-10-07 DIAGNOSIS — M542 Cervicalgia: Secondary | ICD-10-CM | POA: Insufficient documentation

## 2020-10-07 DIAGNOSIS — Z79899 Other long term (current) drug therapy: Secondary | ICD-10-CM | POA: Insufficient documentation

## 2020-10-07 LAB — COMPREHENSIVE METABOLIC PANEL
ALT: 11 U/L (ref 0–44)
AST: 13 U/L — ABNORMAL LOW (ref 15–41)
Albumin: 4.1 g/dL (ref 3.5–5.0)
Alkaline Phosphatase: 57 U/L (ref 38–126)
Anion gap: 9 (ref 5–15)
BUN: 13 mg/dL (ref 6–20)
CO2: 24 mmol/L (ref 22–32)
Calcium: 9.2 mg/dL (ref 8.9–10.3)
Chloride: 105 mmol/L (ref 98–111)
Creatinine, Ser: 0.82 mg/dL (ref 0.44–1.00)
GFR, Estimated: >60 mL/min (ref >60)
Glucose, Bld: 91 mg/dL (ref 70–99)
Potassium: 3.5 mmol/L (ref 3.5–5.1)
Sodium: 138 mmol/L (ref 135–145)
Total Bilirubin: 0.9 mg/dL (ref 0.3–1.2)
Total Protein: 7.9 g/dL (ref 6.5–8.1)

## 2020-10-07 LAB — CBC WITH DIFFERENTIAL/PLATELET
Abs Immature Granulocytes: 0.03 10*3/uL (ref 0.00–0.07)
Basophils Absolute: 0.1 10*3/uL (ref 0.0–0.1)
Basophils Relative: 1 %
Eosinophils Absolute: 0.1 10*3/uL (ref 0.0–0.5)
Eosinophils Relative: 1 %
HCT: 35.6 % — ABNORMAL LOW (ref 36.0–46.0)
Hemoglobin: 11.4 g/dL — ABNORMAL LOW (ref 12.0–15.0)
Immature Granulocytes: 0 %
Lymphocytes Relative: 38 %
Lymphs Abs: 3.3 10*3/uL (ref 0.7–4.0)
MCH: 26.7 pg (ref 26.0–34.0)
MCHC: 32 g/dL (ref 30.0–36.0)
MCV: 83.4 fL (ref 80.0–100.0)
Monocytes Absolute: 0.7 10*3/uL (ref 0.1–1.0)
Monocytes Relative: 8 %
Neutro Abs: 4.6 10*3/uL (ref 1.7–7.7)
Neutrophils Relative %: 52 %
Platelets: 221 10*3/uL (ref 150–400)
RBC: 4.27 MIL/uL (ref 3.87–5.11)
RDW: 14.1 % (ref 11.5–15.5)
WBC: 8.8 10*3/uL (ref 4.0–10.5)
nRBC: 0 % (ref 0.0–0.2)

## 2020-10-07 LAB — FOLATE RBC
Folate, Hemolysate: 301 ng/mL
Folate, RBC: 920 ng/mL (ref >498)
Hematocrit: 32.7 % — ABNORMAL LOW (ref 34.0–46.6)

## 2020-10-07 MED ORDER — DIPHENHYDRAMINE HCL 50 MG/ML IJ SOLN
25.0000 mg | Freq: Once | INTRAMUSCULAR | Status: AC
  Administered 2020-10-07: 25 mg via INTRAVENOUS
  Filled 2020-10-07: qty 1

## 2020-10-07 MED ORDER — SODIUM CHLORIDE 0.9 % IV BOLUS
1000.0000 mL | Freq: Once | INTRAVENOUS | Status: AC
  Administered 2020-10-07: 1000 mL via INTRAVENOUS

## 2020-10-07 MED ORDER — PROCHLORPERAZINE EDISYLATE 10 MG/2ML IJ SOLN
10.0000 mg | Freq: Once | INTRAMUSCULAR | Status: AC
  Administered 2020-10-07: 10 mg via INTRAVENOUS
  Filled 2020-10-07: qty 2
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
## 2020-10-07 NOTE — ED Notes (Signed)
Main lab will add on HCG
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
## 2020-10-08 LAB — HCG, QUANTITATIVE, PREGNANCY: hCG, Beta Chain, Quant, S: <1 m[IU]/mL (ref <5)

## 2020-10-08 MED ORDER — METHOCARBAMOL 500 MG PO TABS: 500.0000 mg | ORAL_TABLET | Freq: Two times a day (BID) | ORAL | 0 refills | Status: DC
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
## 2020-11-04 NOTE — Progress Notes (Signed)
[facility] CONSULT NOTE  Patient Care Team: [name], MD as PCP - General (Family Medicine)  CHIEF COMPLAINTS/PURPOSE OF CONSULTATION:  Anemia, unspecified, initial consultation  ASSESSMENT & PLAN:   This is a very pleasant 30 year old female patient with juvenile diabetes, rheumatoid arthritis on Plaquenil, hypertension, obesity status post gastric sleeve surgery referred to hematology for evaluation of anemia.  She has been taking oral iron supplementation however still feels very fatigued, has been craving ice.  She has not started taking B12 supplementation yet.  We have repeated labs which showed downtrending ferritin and worsening anemia hence have recommended proceed with intravenous iron supplementation.  We have discussed about adverse effects of intravenous iron supplementation including but not limited to fatigue, myalgias, flulike symptoms and very rare side effect of severe anaphylaxis. She was agreeable to IV iron supplementation.  With regards to B12, she would rather prefer injections then taking a pill daily a week as it is hard for her to be compliant with daily pills.  Syringes sent prescription has been dispensed to the pharmacy of her choice.  She was also recommended to follow-up in 3 months with repeat labs. Thank you for consulting [name] the care of this patient.  Please not hesitate to contact us with any additional questions or concerns  HISTORY OF PRESENTING ILLNESS:   [name] 30 y.o. female is here because of anemia.  This is a very pleasant 30 year old female patient with past medical history significant for juvenile diabetes, rheumatoid arthritis on Plaquenil, gastric sleeve surgery referred to hematology for evaluation of anemia.    Since last visit, she continues to feel tired.  She has been taking iron as recommended however she has not started on her B12 supplementation.  Apparently she forgot.  She continues to note some ice craving,  shortness of breath on exertion and fatigue. Rest of the pertinent 10 point ROS reviewed and negative.  MEDICAL HISTORY:  Past Medical History:  Diagnosis Date   Anemia    Arthritis    Asthma    Diabetes mellitus without complication (HCC)    Hypertension    Vitamin D deficiency     SURGICAL HISTORY: Past Surgical History:  Procedure Laterality Date   CATARACT EXTRACTION, BILATERAL Bilateral 2022   GASTRIC BYPASS  07/2017    SOCIAL HISTORY: Social History   Socioeconomic History   Marital status: Single    Spouse name: Not on file   Number of children: Not on file   Years of education: Not on file   Highest education level: Not on file  Occupational History   Not on file  Tobacco Use   Smoking status: Never   Smokeless tobacco: Never  Vaping Use   Vaping Use: Never used  Substance and Sexual Activity   Alcohol use: Yes    Comment: occ   Drug use: Never   Sexual activity: Yes    Birth control/protection: None  Other Topics Concern   Not on file  Social History Narrative   Not on file   Social Determinants of Health   Financial Resource Strain: Not on file  Food Insecurity: Not on file  Transportation Needs: Not on file  Physical Activity: Not on file  Stress: Not on file  Social Connections: Not on file  Intimate Partner Violence: Not on file    FAMILY HISTORY: Family History  Problem Relation Age of Onset   Lupus Maternal Grandmother    Asthma Sister    ADD /  ADHD Sister     ALLERGIES:  is allergic to fruit extracts, latex, and zinc.  MEDICATIONS:  Current Outpatient Medications  Medication Sig Dispense Refill   albuterol (PROVENTIL HFA;VENTOLIN HFA) 108 (90 Base) MCG/ACT inhaler Inhale 2 puffs into the lungs every 4 (four) hours as needed for wheezing.     baclofen (LIORESAL) 10 MG tablet Take 10 mg by mouth 3 (three) times daily as needed for muscle spasms.     ergocalciferol (VITAMIN D2) 1.25 MG (50000 UT) capsule Take 50,000 Units by mouth  once a week. Wednesday     ferrous sulfate 325 (65 FE) MG tablet Take 325 mg by mouth daily.     FLUoxetine (PROZAC) 40 MG capsule Take 40 mg by mouth every morning.     HUMALOG 100 UNIT/ML injection Inject 100 Units as directed See admin instructions. Inject up to 100 units per day via insulin pump as directed  9   HYDROcodone-acetaminophen (NORCO/VICODIN) 5-325 MG tablet Take 1 tablet by mouth every 6 (six) hours as needed for moderate pain or severe pain. 30 tablet 0   hydroxychloroquine (PLAQUENIL) 200 MG tablet TAKE 1 TABLET(200 MG) BY MOUTH TWICE DAILY (Patient taking differently: Take 200 mg by mouth 2 (two) times daily.) 180 tablet 0   Lactobacillus (PROBIOTIC ACIDOPHILUS) TABS Take 1 tablet by mouth daily.     methocarbamol (ROBAXIN) 500 MG tablet Take 1 tablet (500 mg total) by mouth 2 (two) times daily. 20 tablet 0   ondansetron (ZOFRAN ODT) 4 MG disintegrating tablet Take 1 tablet (4 mg total) by mouth every 8 (eight) hours as needed for up to 15 doses for nausea or vomiting. 15 tablet 0   prazosin (MINIPRESS) 2 MG capsule Take 2 mg by mouth at bedtime.     traZODone (DESYREL) 50 MG tablet Take 100 mg by mouth at bedtime as needed for sleep.     triamcinolone cream (KENALOG) 0.1 % Apply 1 application topically as needed (dry, itch skin). Apply 1-2 times a day to affected areas of dry itchy skin on the body until clear, then as needed. Not to face.     valsartan (DIOVAN) 160 MG tablet Take 160 mg by mouth daily.     No current facility-administered medications for this visit.     PHYSICAL EXAMINATION:  ECOG PERFORMANCE STATUS: 0 - Asymptomatic  Vitals:   11/05/20 0914  BP: 132/72  Pulse: (!) 113  Resp: 18  Temp: 98.4 F (36.9 C)  SpO2: 100%    Filed Weights   11/05/20 0914  Weight: 268 lb 9.6 oz (121.8 kg)   GENERAL:alert, no distress and comfortable, obese. SKIN: skin color, texture, turgor are normal, no rashes or significant lesions EYES: normal, conjunctiva are  pink and non-injected, sclera clear OROPHARYNX:no exudate, no erythema and lips, buccal mucosa, and tongue normal  NECK: supple, thyroid normal size, non-tender, without nodularity LYMPH:  no palpable lymphadenopathy in the cervical, axillary LUNGS: clear to auscultation and percussion with normal breathing effort HEART: regular rate & rhythm and no murmurs and no lower extremity edema ABDOMEN:abdomen soft, non-tender and normal bowel sounds, no splenomegaly Musculoskeletal:no cyanosis of digits and no clubbing  PSYCH: alert & oriented x 3 with fluent speech NEURO: no focal motor/sensory deficits  LABORATORY DATA:  I have reviewed the data as listed Lab Results  Component Value Date   WBC 4.4 11/05/2020   HGB 9.8 (L) 11/05/2020   HCT 31.1 (L) 11/05/2020   MCV 82.5 11/05/2020   PLT 196  11/05/2020     Chemistry      Component Value Date/Time   NA 137 11/05/2020 1012   K 3.7 11/05/2020 1012   CL 105 11/05/2020 1012   CO2 23 11/05/2020 1012   BUN 7 11/05/2020 1012   CREATININE 0.93 11/05/2020 1012   CREATININE 0.67 04/17/2020 0843      Component Value Date/Time   CALCIUM 8.0 (L) 11/05/2020 1012   ALKPHOS 60 11/05/2020 1012   AST 8 (L) 11/05/2020 1012   ALT 9 11/05/2020 1012   BILITOT 0.4 11/05/2020 1012       RADIOGRAPHIC STUDIES: I have personally reviewed the radiological images as listed and agreed with the findings in the report. CT Head Wo Contrast  Result Date: 10/07/2020 CLINICAL DATA:  Head trauma EXAM: CT HEAD WITHOUT CONTRAST TECHNIQUE: Contiguous axial images were obtained from the base of the skull through the vertex without intravenous contrast. COMPARISON:  None. FINDINGS: Brain: No acute territorial infarction, hemorrhage or intracranial mass. Probable low lying cerebellar tonsils extending about 9 mm inferior to foramen magnum. Ventricles are nonenlarged Vascular: No hyperdense vessel or unexpected calcification. Skull: Normal. Negative for fracture or focal  lesion. Sinuses/Orbits: No acute finding. Other: None IMPRESSION: 1. No CT evidence for acute intracranial abnormality. Electronically Signed   By: Jasmine Pang M.D.   On: 10/07/2020 22:57   CT Cervical Spine Wo Contrast  Result Date: 10/07/2020 CLINICAL DATA:  Posterior head injury, fall, pain under right eye EXAM: CT MAXILLOFACIAL WITHOUT CONTRAST CT CERVICAL SPINE WITHOUT CONTRAST TECHNIQUE: Multidetector CT imaging of the maxillofacial structures was performed. Multiplanar CT image reconstructions were also generated. A small metallic BB was placed on the right temple in order to reliably differentiate right from left. Multidetector CT imaging of the cervical spine was performed without intravenous contrast. Multiplanar CT image reconstructions were also generated. COMPARISON:  CT head dated 10/07/2020 FINDINGS: CT MAXILLOFACIAL FINDINGS Osseous: No evidence of maxillofacial fracture. Mandible is intact. Bilateral mandibular condyles are well-seated in the TMJs. Orbits: Bilateral orbits, including the globes and retroconal soft tissues, are within normal limits. Sinuses: The visualized paranasal sinuses are essentially clear. The mastoid air cells are unopacified. Soft tissues: Negative. Limited intracranial: Better evaluated on recent CT head. CT CERVICAL FINDINGS Alignment: Straightening of the cervical spine, likely positional. Skull base and vertebrae: No acute fracture. No primary bone lesion or focal pathologic process. Soft tissues and spinal canal: No prevertebral fluid or swelling. No visible canal hematoma. Disc levels: Vertebral body heights and intervertebral disc spaces are maintained. Spinal canal is patent. Upper chest: Visualized lung apices are clear. Other: Visualized thyroid is unremarkable. IMPRESSION: Normal maxillofacial CT. Normal cervical spine CT. Electronically Signed   By: Charline Bills M.D.   On: 10/07/2020 23:46   CT Maxillofacial Wo Contrast  Result Date:  10/07/2020 CLINICAL DATA:  Posterior head injury, fall, pain under right eye EXAM: CT MAXILLOFACIAL WITHOUT CONTRAST CT CERVICAL SPINE WITHOUT CONTRAST TECHNIQUE: Multidetector CT imaging of the maxillofacial structures was performed. Multiplanar CT image reconstructions were also generated. A small metallic BB was placed on the right temple in order to reliably differentiate right from left. Multidetector CT imaging of the cervical spine was performed without intravenous contrast. Multiplanar CT image reconstructions were also generated. COMPARISON:  CT head dated 10/07/2020 FINDINGS: CT MAXILLOFACIAL FINDINGS Osseous: No evidence of maxillofacial fracture. Mandible is intact. Bilateral mandibular condyles are well-seated in the TMJs. Orbits: Bilateral orbits, including the globes and retroconal soft tissues, are within normal limits. Sinuses:  The visualized paranasal sinuses are essentially clear. The mastoid air cells are unopacified. Soft tissues: Negative. Limited intracranial: Better evaluated on recent CT head. CT CERVICAL FINDINGS Alignment: Straightening of the cervical spine, likely positional. Skull base and vertebrae: No acute fracture. No primary bone lesion or focal pathologic process. Soft tissues and spinal canal: No prevertebral fluid or swelling. No visible canal hematoma. Disc levels: Vertebral body heights and intervertebral disc spaces are maintained. Spinal canal is patent. Upper chest: Visualized lung apices are clear. Other: Visualized thyroid is unremarkable. IMPRESSION: Normal maxillofacial CT. Normal cervical spine CT. Electronically Signed   By: Charline Bills M.D.   On: 10/07/2020 23:46    All questions were answered. The patient knows to call the clinic with any problems, questions or concerns. I spent 20 minutes in the care of this patient including history and physical, review of records, counseling and coordination of care.  We have reviewed labs from last visit, discussed  about treatment of iron deficiency with IV iron if there is no improvement and also the need to start on B12 supplementation as soon as possible.     Rachel Moulds, MD 11/06/2020 11:35 AM

## 2020-11-05 ENCOUNTER — Other Ambulatory Visit: Payer: Self-pay

## 2020-11-05 ENCOUNTER — Inpatient Hospital Stay: Payer: Medicaid Other

## 2020-11-05 ENCOUNTER — Inpatient Hospital Stay: Payer: Medicaid Other | Attending: Hematology and Oncology | Admitting: Hematology and Oncology

## 2020-11-05 VITALS — BP 132/72 | HR 113 | Temp 98.4°F | Resp 18 | Ht 64.0 in | Wt 268.6 lb

## 2020-11-05 DIAGNOSIS — D649 Anemia, unspecified: Secondary | ICD-10-CM | POA: Insufficient documentation

## 2020-11-05 DIAGNOSIS — D500 Iron deficiency anemia secondary to blood loss (chronic): Secondary | ICD-10-CM

## 2020-11-05 DIAGNOSIS — E611 Iron deficiency: Secondary | ICD-10-CM | POA: Insufficient documentation

## 2020-11-05 DIAGNOSIS — E538 Deficiency of other specified B group vitamins: Secondary | ICD-10-CM | POA: Diagnosis not present

## 2020-11-05 DIAGNOSIS — Z9884 Bariatric surgery status: Secondary | ICD-10-CM | POA: Diagnosis not present

## 2020-11-05 LAB — CBC WITH DIFFERENTIAL/PLATELET
Abs Immature Granulocytes: 0.01 10*3/uL (ref 0.00–0.07)
Basophils Absolute: 0 10*3/uL (ref 0.0–0.1)
Basophils Relative: 1 %
Eosinophils Absolute: 0.1 10*3/uL (ref 0.0–0.5)
Eosinophils Relative: 2 %
HCT: 31.1 % — ABNORMAL LOW (ref 36.0–46.0)
Hemoglobin: 9.8 g/dL — ABNORMAL LOW (ref 12.0–15.0)
Immature Granulocytes: 0 %
Lymphocytes Relative: 34 %
Lymphs Abs: 1.5 10*3/uL (ref 0.7–4.0)
MCH: 26 pg (ref 26.0–34.0)
MCHC: 31.5 g/dL (ref 30.0–36.0)
MCV: 82.5 fL (ref 80.0–100.0)
Monocytes Absolute: 0.4 10*3/uL (ref 0.1–1.0)
Monocytes Relative: 8 %
Neutro Abs: 2.4 10*3/uL (ref 1.7–7.7)
Neutrophils Relative %: 55 %
Platelets: 196 10*3/uL (ref 150–400)
RBC: 3.77 MIL/uL — ABNORMAL LOW (ref 3.87–5.11)
RDW: 13.7 % (ref 11.5–15.5)
WBC: 4.4 10*3/uL (ref 4.0–10.5)
nRBC: 0 % (ref 0.0–0.2)

## 2020-11-05 LAB — COMPREHENSIVE METABOLIC PANEL
ALT: 9 U/L (ref 0–44)
AST: 8 U/L — ABNORMAL LOW (ref 15–41)
Albumin: 3.1 g/dL — ABNORMAL LOW (ref 3.5–5.0)
Alkaline Phosphatase: 60 U/L (ref 38–126)
Anion gap: 9 (ref 5–15)
BUN: 7 mg/dL (ref 6–20)
CO2: 23 mmol/L (ref 22–32)
Calcium: 8 mg/dL — ABNORMAL LOW (ref 8.9–10.3)
Chloride: 105 mmol/L (ref 98–111)
Creatinine, Ser: 0.93 mg/dL (ref 0.44–1.00)
GFR, Estimated: >60 mL/min (ref >60)
Glucose, Bld: 400 mg/dL — ABNORMAL HIGH (ref 70–99)
Potassium: 3.7 mmol/L (ref 3.5–5.1)
Sodium: 137 mmol/L (ref 135–145)
Total Bilirubin: 0.4 mg/dL (ref 0.3–1.2)
Total Protein: 6.3 g/dL — ABNORMAL LOW (ref 6.5–8.1)

## 2020-11-05 LAB — IRON AND TIBC
Iron: 53 ug/dL (ref 41–142)
Saturation Ratios: 18 % — ABNORMAL LOW (ref 21–57)
TIBC: 291 ug/dL (ref 236–444)
UIBC: 238 ug/dL (ref 120–384)

## 2020-11-05 LAB — VITAMIN B12: Vitamin B-12: 142 pg/mL — ABNORMAL LOW (ref 180–914)

## 2020-11-05 LAB — FERRITIN: Ferritin: 10 ng/mL — ABNORMAL LOW (ref 11–307)

## 2020-11-05 MED ORDER — CYANOCOBALAMIN 1000 MCG/ML IJ SOLN: 1000.0000 ug | Freq: Once | INTRAMUSCULAR | 0 refills | Status: AC

## 2020-11-06 ENCOUNTER — Encounter: Payer: Self-pay | Admitting: Hematology and Oncology

## 2020-11-06 ENCOUNTER — Other Ambulatory Visit: Payer: Self-pay | Admitting: Hematology and Oncology

## 2020-11-06 ENCOUNTER — Other Ambulatory Visit: Payer: Self-pay

## 2020-11-06 DIAGNOSIS — D500 Iron deficiency anemia secondary to blood loss (chronic): Secondary | ICD-10-CM

## 2020-11-06 DIAGNOSIS — D509 Iron deficiency anemia, unspecified: Secondary | ICD-10-CM | POA: Insufficient documentation

## 2020-11-06 NOTE — Progress Notes (Signed)
Called in order for syringes and needles to patient's preferred pharmacy.

## 2020-11-12 ENCOUNTER — Inpatient Hospital Stay: Payer: Medicaid Other

## 2020-11-12 ENCOUNTER — Other Ambulatory Visit: Payer: Self-pay

## 2020-11-12 VITALS — BP 150/86 | HR 82 | Temp 98.3°F | Resp 17

## 2020-11-12 DIAGNOSIS — E611 Iron deficiency: Secondary | ICD-10-CM | POA: Diagnosis not present

## 2020-11-12 DIAGNOSIS — D500 Iron deficiency anemia secondary to blood loss (chronic): Secondary | ICD-10-CM

## 2020-11-12 MED ORDER — SODIUM CHLORIDE 0.9 % IV SOLN: Freq: Once | INTRAVENOUS | Status: AC

## 2020-11-12 MED ORDER — SODIUM CHLORIDE 0.9 % IV SOLN
200.0000 mg | Freq: Once | INTRAVENOUS | Status: AC
  Administered 2020-11-12: 200 mg via INTRAVENOUS
  Filled 2020-11-12: qty 200

## 2020-11-12 NOTE — Patient Instructions (Signed)

## 2020-11-17 ENCOUNTER — Inpatient Hospital Stay: Payer: Medicaid Other

## 2020-11-17 ENCOUNTER — Other Ambulatory Visit: Payer: Self-pay

## 2020-11-17 VITALS — BP 147/80 | HR 86 | Temp 98.6°F | Resp 18

## 2020-11-17 DIAGNOSIS — E611 Iron deficiency: Secondary | ICD-10-CM | POA: Diagnosis not present

## 2020-11-17 DIAGNOSIS — D500 Iron deficiency anemia secondary to blood loss (chronic): Secondary | ICD-10-CM

## 2020-11-17 MED ORDER — SODIUM CHLORIDE 0.9 % IV SOLN: Freq: Once | INTRAVENOUS | Status: AC

## 2020-11-17 MED ORDER — IRON SUCROSE 20 MG/ML IV SOLN
200.0000 mg | Freq: Once | INTRAVENOUS | Status: AC
  Administered 2020-11-17: 200 mg via INTRAVENOUS
  Filled 2020-11-17: qty 200

## 2020-11-17 NOTE — Patient Instructions (Signed)

## 2020-11-25 ENCOUNTER — Other Ambulatory Visit: Payer: Self-pay

## 2020-11-25 ENCOUNTER — Ambulatory Visit: Payer: Medicaid Other

## 2020-11-25 ENCOUNTER — Inpatient Hospital Stay: Payer: Medicaid Other | Attending: Hematology and Oncology

## 2020-11-25 VITALS — BP 152/82 | HR 88 | Temp 98.1°F | Resp 16

## 2020-11-25 DIAGNOSIS — D509 Iron deficiency anemia, unspecified: Secondary | ICD-10-CM | POA: Insufficient documentation

## 2020-11-25 DIAGNOSIS — D500 Iron deficiency anemia secondary to blood loss (chronic): Secondary | ICD-10-CM

## 2020-11-25 DIAGNOSIS — E538 Deficiency of other specified B group vitamins: Secondary | ICD-10-CM | POA: Insufficient documentation

## 2020-11-25 MED ORDER — SODIUM CHLORIDE 0.9 % IV SOLN
200.0000 mg | Freq: Once | INTRAVENOUS | Status: AC
  Administered 2020-11-25: 200 mg via INTRAVENOUS
  Filled 2020-11-25: qty 200

## 2020-11-25 MED ORDER — SODIUM CHLORIDE 0.9 % IV SOLN: Freq: Once | INTRAVENOUS | Status: AC

## 2020-11-25 MED ORDER — ACETAMINOPHEN 325 MG PO TABS
650.0000 mg | ORAL_TABLET | Freq: Once | ORAL | Status: AC
  Administered 2020-11-25: 650 mg via ORAL
  Filled 2020-11-25: qty 2

## 2020-11-25 NOTE — Patient Instructions (Signed)

## 2020-11-25 NOTE — Progress Notes (Signed)
Patient was observed for 30 minutes post iron infusion with no adverse reactions. Vitals stable and patient in no distress upon leaving infusion clinic.

## 2020-11-27 ENCOUNTER — Inpatient Hospital Stay: Payer: Medicaid Other

## 2020-11-27 ENCOUNTER — Ambulatory Visit: Payer: Medicaid Other

## 2020-11-27 ENCOUNTER — Other Ambulatory Visit: Payer: Self-pay

## 2020-11-27 VITALS — BP 152/90 | HR 84 | Temp 99.1°F | Resp 18

## 2020-11-27 DIAGNOSIS — D500 Iron deficiency anemia secondary to blood loss (chronic): Secondary | ICD-10-CM

## 2020-11-27 DIAGNOSIS — D509 Iron deficiency anemia, unspecified: Secondary | ICD-10-CM | POA: Diagnosis not present

## 2020-11-27 MED ORDER — SODIUM CHLORIDE 0.9 % IV SOLN: Freq: Once | INTRAVENOUS | Status: AC

## 2020-11-27 MED ORDER — SODIUM CHLORIDE 0.9 % IV SOLN
200.0000 mg | Freq: Once | INTRAVENOUS | Status: AC
  Administered 2020-11-27: 200 mg via INTRAVENOUS
  Filled 2020-11-27: qty 200

## 2020-11-27 NOTE — Patient Instructions (Signed)

## 2020-11-27 NOTE — Progress Notes (Signed)
20 minutes after pt received venofer infusion she complained of itchy hands. No other symptoms were reported. I informed Dr. Mosetta Putt and she recommended I give the pt Benadryl or Claritin. When I asked the pt which she would like she said, "I'm fine. I'll just take something at home. It's not bothering me that much anyway." Dr. Mosetta Putt informed of pt's decision. Pt's vital were rechecked and pt was stable upon d/c.

## 2020-12-02 ENCOUNTER — Inpatient Hospital Stay: Payer: Medicaid Other

## 2020-12-02 ENCOUNTER — Other Ambulatory Visit: Payer: Self-pay

## 2020-12-02 VITALS — BP 156/90 | HR 85 | Temp 98.7°F | Resp 18

## 2020-12-02 DIAGNOSIS — D509 Iron deficiency anemia, unspecified: Secondary | ICD-10-CM | POA: Diagnosis not present

## 2020-12-02 DIAGNOSIS — D500 Iron deficiency anemia secondary to blood loss (chronic): Secondary | ICD-10-CM

## 2020-12-02 MED ORDER — SODIUM CHLORIDE 0.9 % IV SOLN
200.0000 mg | Freq: Once | INTRAVENOUS | Status: AC
  Administered 2020-12-02: 200 mg via INTRAVENOUS
  Filled 2020-12-02: qty 200

## 2020-12-02 MED ORDER — SODIUM CHLORIDE 0.9 % IV SOLN: Freq: Once | INTRAVENOUS | Status: AC

## 2020-12-02 NOTE — Patient Instructions (Signed)

## 2020-12-02 NOTE — Progress Notes (Signed)
Pt observed for 30 minutes post Venofer infusion.  Tolerated treatment well. VSS at discharge.  Ambulatory to lobby.  

## 2020-12-14 ENCOUNTER — Encounter (HOSPITAL_BASED_OUTPATIENT_CLINIC_OR_DEPARTMENT_OTHER): Payer: Self-pay | Admitting: Emergency Medicine

## 2020-12-14 ENCOUNTER — Other Ambulatory Visit: Payer: Self-pay

## 2020-12-14 ENCOUNTER — Emergency Department (HOSPITAL_BASED_OUTPATIENT_CLINIC_OR_DEPARTMENT_OTHER)
Admission: EM | Admit: 2020-12-14 | Discharge: 2020-12-14 | Disposition: A | Discharge status: Home / Self Care | Payer: PRIVATE HEALTH INSURANCE | Attending: Emergency Medicine | Admitting: Emergency Medicine

## 2020-12-14 ENCOUNTER — Emergency Department (HOSPITAL_BASED_OUTPATIENT_CLINIC_OR_DEPARTMENT_OTHER): Payer: PRIVATE HEALTH INSURANCE | Admitting: Radiology

## 2020-12-14 DIAGNOSIS — J45909 Unspecified asthma, uncomplicated: Secondary | ICD-10-CM | POA: Insufficient documentation

## 2020-12-14 DIAGNOSIS — Z794 Long term (current) use of insulin: Secondary | ICD-10-CM | POA: Insufficient documentation

## 2020-12-14 DIAGNOSIS — I10 Essential (primary) hypertension: Secondary | ICD-10-CM | POA: Diagnosis not present

## 2020-12-14 DIAGNOSIS — S62655A Nondisplaced fracture of medial phalanx of left ring finger, initial encounter for closed fracture: Secondary | ICD-10-CM | POA: Insufficient documentation

## 2020-12-14 DIAGNOSIS — S6992XA Unspecified injury of left wrist, hand and finger(s), initial encounter: Secondary | ICD-10-CM | POA: Diagnosis present

## 2020-12-14 DIAGNOSIS — Z91040 Latex allergy status: Secondary | ICD-10-CM | POA: Diagnosis not present

## 2020-12-14 DIAGNOSIS — Y9361 Activity, american tackle football: Secondary | ICD-10-CM | POA: Diagnosis not present

## 2020-12-14 DIAGNOSIS — Z79899 Other long term (current) drug therapy: Secondary | ICD-10-CM | POA: Diagnosis not present

## 2020-12-14 DIAGNOSIS — E1040 Type 1 diabetes mellitus with diabetic neuropathy, unspecified: Secondary | ICD-10-CM | POA: Insufficient documentation

## 2020-12-14 DIAGNOSIS — W230XXA Caught, crushed, jammed, or pinched between moving objects, initial encounter: Secondary | ICD-10-CM | POA: Insufficient documentation

## 2020-12-14 NOTE — ED Provider Notes (Signed)
[facility] EMERGENCY DEPT Provider Note   CSN: [id number] Arrival date & time: 12/14/20  1017     History Chief Complaint  Patient presents with   Finger Injury    [name] is a 30 y.o. female.  Patient presenting with chief complaint of pain to the left hand middle finger.  She states that the injury occurred 2 days ago while playing football and the football jammed her left hand ring finger.  Denies injury elsewhere denies fevers cough vomiting or diarrhea.      Past Medical History:  Diagnosis Date   Anemia    Arthritis    Asthma    Diabetes mellitus without complication (HCC)    Hypertension    Vitamin D deficiency     Patient Active Problem List   Diagnosis Date Noted   IDA (iron deficiency anemia) 11/06/2020   Intractable abdominal pain 07/26/2020   Right upper quadrant abdominal pain 07/26/2020   Acute pyelonephritis 07/26/2020   Diabetes mellitus type 1, controlled, with complications (HCC) 07/26/2020   Obesity, Class III, BMI 40-49.9 (morbid obesity) (HCC) 07/26/2020   Enterococcus faecalis infection 07/26/2020   Essential hypertension 07/26/2020   RUQ abdominal pain 07/26/2020   Chronic low back pain 11/22/2013   Clinical depression 10/07/2013   Extreme obesity 10/07/2013   Diabetic neuropathy (HCC) 08/06/2013   General patient noncompliance 12/21/2012   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     Family History  Problem Relation Age of Onset   Lupus Maternal Grandmother    Asthma Sister    ADD / ADHD Sister     Social History   Tobacco Use    Smoking status: Never   Smokeless tobacco: Never  Vaping Use   Vaping Use: Never used  Substance Use Topics   Alcohol use: Yes    Comment: occ   Drug use: Never    Home Medications Prior to Admission medications   Medication Sig Start Date End Date Taking? Authorizing Provider  albuterol (PROVENTIL HFA;VENTOLIN HFA) 108 (90 Base) MCG/ACT inhaler Inhale 2 puffs into the lungs every 4 (four) hours as needed for wheezing.    [provider]  baclofen (LIORESAL) 10 MG tablet Take 10 mg by mouth 3 (three) times daily as needed for muscle spasms. 07/12/19   [provider]  ergocalciferol (VITAMIN D2) 1.25 MG (50000 UT) capsule Take 50,000 Units by mouth once a week. Wednesday    [provider]  ferrous sulfate 325 (65 FE) MG tablet Take 325 mg by mouth daily.    [provider]  FLUoxetine (PROZAC) 40 MG capsule Take 40 mg by mouth every morning. 03/07/20   [provider]  HUMALOG 100 UNIT/ML injection Inject 100 Units as directed See admin instructions. Inject up to 100 units per day via insulin pump as directed 02/06/18   [provider]  HYDROcodone-acetaminophen (NORCO/VICODIN) 5-325 MG tablet Take 1 tablet by mouth every 6 (six) hours as needed for moderate pain or severe pain. 07/27/20   Azucena Fallen, MD  hydroxychloroquine (PLAQUENIL) 200 MG tablet TAKE 1 TABLET(200  MG) BY MOUTH TWICE DAILY Patient taking differently: Take 200 mg by mouth 2 (two) times daily. 04/17/20   Gearldine Bienenstock, PA-C  Lactobacillus (PROBIOTIC ACIDOPHILUS) TABS Take 1 tablet by mouth daily.    [provider]  methocarbamol (ROBAXIN) 500 MG tablet Take 1 tablet (500 mg total) by mouth 2 (two) times daily. 10/08/20   McDonald, Mia A, PA-C  ondansetron (ZOFRAN ODT) 4 MG disintegrating tablet Take 1 tablet (4 mg total) by mouth every 8 (eight) hours as needed for up to 15 doses for nausea or vomiting. 07/23/20   Terald Sleeper, MD  prazosin (MINIPRESS)  2 MG capsule Take 2 mg by mouth at bedtime. 03/06/20   [provider]  traZODone (DESYREL) 50 MG tablet Take 100 mg by mouth at bedtime as needed for sleep. 03/06/20   [provider]  triamcinolone cream (KENALOG) 0.1 % Apply 1 application topically as needed (dry, itch skin). Apply 1-2 times a day to affected areas of dry itchy skin on the body until clear, then as needed. Not to face. 09/08/16   [provider]  valsartan (DIOVAN) 160 MG tablet Take 160 mg by mouth daily. 06/16/20   [provider]    Allergies    Fruit extracts, Latex, and Zinc  Review of Systems   Review of Systems  Constitutional:  Negative for fever.  HENT:  Negative for ear pain.   Eyes:  Negative for pain.  Respiratory:  Negative for cough.   Cardiovascular:  Negative for chest pain.  Gastrointestinal:  Negative for abdominal pain.  Genitourinary:  Negative for flank pain.  Musculoskeletal:  Negative for back pain.  Skin:  Negative for rash.  Neurological:  Negative for headaches.   Physical Exam Updated Vital Signs BP 135/82 (BP Location: Right Arm)   Pulse 87   Temp 98.2 F (36.8 C)   Resp 17   Ht 5\' 4"  (1.626 m)   Wt 117 kg   SpO2 100%   BMI 44.29 kg/m   Physical Exam Constitutional:      General: She is not in acute distress.    Appearance: Normal appearance.  HENT:     Head: Normocephalic.     Nose: Nose normal.  Eyes:     Extraocular Movements: Extraocular movements intact.  Cardiovascular:     Rate and Rhythm: Normal rate.  Pulmonary:     Effort: Pulmonary effort is normal.  Musculoskeletal:        General: Normal range of motion.     Cervical back: Normal range of motion.     Comments: Left upper extremity neurovascularly intact.  Tenderness to the fourth finger mid finger region.  Otherwise intact flexion extension  Neurological:     General: No focal deficit present.     Mental Status: She is alert. Mental status is at baseline.    ED  Results / Procedures / Treatments   Labs (all labs ordered are listed, but only abnormal results are displayed) Labs Reviewed - No data to display  EKG None  Radiology DG Finger Ring Left  Result Date: 12/14/2020 CLINICAL DATA:  Left ring finger pain and swelling after being hit football. EXAM: LEFT RING FINGER 2+V COMPARISON:  Left hand x-rays dated December 06, 2018. FINDINGS: Acute nondisplaced fracture of the volar base fourth middle phalanx. No additional fracture. No dislocation. Joint spaces are preserved. Mild soft tissue swelling around the fourth PIP joint. IMPRESSION: 1. Acute nondisplaced fracture of the fourth middle phalanx  volar base. Electronically Signed   By: Obie Dredge M.D.   On: 12/14/2020 11:52    Procedures .Ortho Injury Treatment  Date/Time: 12/14/2020 12:08 PM Performed by: Cheryll Cockayne, MD Authorized by: Cheryll Cockayne, MD  Post-procedure neurovascular assessment: post-procedure neurovascularly intact Comments: Buddy tape to left upper extremity fourth and third fingers.     Medications Ordered in ED Medications - No data to display  ED Course  I have reviewed the triage vital signs and the nursing notes.  Pertinent labs & imaging results that were available during my care of the patient were reviewed by me and considered in my medical decision making (see chart for details).    MDM Rules/Calculators/A&P                           X-ray consistent with left hand fourth finger mid phalanx nondisplaced fracture.  Patient placed in a buddy splint by myself.  Neurovascular intact after placement.  Advised outpatient follow-up with a hand specialist within the week.  Patient states understanding, discharged home in stable condition.  Final Clinical Impression(s) / ED Diagnoses Final diagnoses:  Closed nondisplaced fracture of middle phalanx of left ring finger, initial encounter    Rx / DC Orders ED Discharge Orders     None        Cheryll Cockayne, MD 12/14/20 1209

## 2020-12-14 NOTE — ED Triage Notes (Addendum)
Pt arrives to ED with c/o of finger pain to left ring finger. This pain occurred due to her finger getting jammed by a football.

## 2020-12-14 NOTE — Discharge Instructions (Addendum)
Call your primary care doctor or specialist as discussed in the next 2-3 days.   Return immediately back to the ER if:  Your symptoms worsen within the next 12-24 hours. You develop new symptoms such as new fevers, persistent vomiting, new pain, shortness of breath, or new weakness or numbness, or if you have any other concerns.  

## 2020-12-14 NOTE — ED Notes (Signed)
Finger splint applied by EDP

## 2021-01-17 LAB — HM DIABETES EYE EXAM

## 2021-02-16 NOTE — Progress Notes (Signed)
Office Visit Note  Patient: [name]             Date of Birth: June 30, 1990           MRN: [id number]             PCP: Jonathon Jordan, MD Referring: Jonathon Jordan, MD Visit Date: 02/25/2021 Occupation: _0 @  Subjective:  Pain in multiple joints   History of Present Illness: [name] is a 30 y.o. female with history of seronegative rheumatoid arthritis and myofascial pain.  She is taking Plaquenil 200 mg 1 tablet by mouth twice daily.  She is tolerating plaquenil without any side effects.  She has not missed any doses of Plaquenil recently.  Patient reports that she has been experiencing recurrent flares up to once a week.  Her flares are typically in her hands and wrist joints.  She states several days ago her left hand and wrist had significant swelling.  Her left wrist pain has been constant but the inflammation has started to subside.  She states today she is also experiencing a generalized aching.  She is also having fatigue on a daily basis.  She has been taking Tylenol and ibuprofen as needed for symptomatic relief.  If her pain is severe she will take hydrocodone.  She continues to follow-up with pain management on a regular basis.   Activities of Daily Living:  Patient reports morning stiffness for several hours.   Patient Reports nocturnal pain.  Difficulty dressing/grooming: Denies Difficulty climbing stairs: Denies Difficulty getting out of chair: Denies Difficulty using hands for taps, buttons, cutlery, and/or writing: Reports  Review of Systems  Constitutional:  Positive for fatigue.  HENT:  Positive for mouth dryness and nose dryness. Negative for mouth sores.   Eyes:  Positive for dryness. Negative for pain and itching.  Respiratory:  Negative for shortness of breath and difficulty breathing.   Cardiovascular:  Negative for chest pain and palpitations.  Gastrointestinal:  Positive for constipation. Negative for blood in stool and diarrhea.   Endocrine: Positive for increased urination.  Genitourinary:  Negative for difficulty urinating.  Musculoskeletal:  Positive for joint pain, joint pain, joint swelling, myalgias, morning stiffness, muscle tenderness and myalgias.  Skin:  Negative for color change, rash and redness.  Allergic/Immunologic: Positive for susceptible to infections.  Neurological:  Positive for headaches and weakness. Negative for dizziness, numbness and memory loss.  Hematological:  Positive for bruising/bleeding tendency.  Psychiatric/Behavioral:  Negative for confusion.    PMFS History:  Patient Active Problem List   Diagnosis Date Noted   IDA (iron deficiency anemia) 11/06/2020   Intractable abdominal pain 07/26/2020   Right upper quadrant abdominal pain 07/26/2020   Acute pyelonephritis 07/26/2020   Diabetes mellitus type 1, controlled, with complications ([place]) 75/64/3329   Obesity, Class III, BMI 40-49.9 (morbid obesity) ([place]) 07/26/2020   Enterococcus faecalis infection 07/26/2020   Essential hypertension 07/26/2020   RUQ abdominal pain 07/26/2020   Chronic low back pain 11/22/2013   Clinical depression 10/07/2013   Extreme obesity 10/07/2013   Diabetic neuropathy ([place]) 08/06/2013   General patient noncompliance 12/21/2012   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[place]) 12/30/2010   Appetite disorder 12/30/2010   BP (high blood pressure) 12/30/2010   Muscle ache 12/30/2010   Cachectic ([place]) 12/30/2010   Patellofemoral disorder of left knee 09/03/2010   Diabetes mellitus 09/03/2010    Past Medical History:  Diagnosis Date   Anemia    Arthritis    Asthma    Diabetes  mellitus without complication ([place])    Hypertension    Vitamin D deficiency     Family History  Problem Relation Age of Onset   Lupus Maternal Grandmother    Asthma Sister    ADD / ADHD Sister    Past Surgical History:  Procedure Laterality Date   CATARACT EXTRACTION, BILATERAL Bilateral 2022   GASTRIC BYPASS  07/2017   Social History    Social History Narrative   Not on file   Immunization History  Administered Date(s) Administered   Influenza Inj Mdck Quad Pf 02/11/2018   PFIZER(Purple Top)SARS-COV-2 Vaccination 05/28/2019, 06/18/2019, 03/06/2020, 09/13/2020   Pneumococcal Polysaccharide-23 10/07/2013     Objective: Vital Signs: BP (!) 142/84 (BP Location: Right Wrist, Patient Position: Sitting, Cuff Size: Normal)   Pulse 96   Ht _0  (1.626 m)   Wt 256 lb (116.1 kg)   BMI 43.94 kg/m    Physical Exam Vitals and nursing note reviewed.  Constitutional:      Appearance: She is well-developed.  HENT:     Head: Normocephalic and atraumatic.  Eyes:     Conjunctiva/sclera: Conjunctivae normal.  Pulmonary:     Effort: Pulmonary effort is normal.  Abdominal:     Palpations: Abdomen is soft.  Musculoskeletal:     Cervical back: Normal range of motion.  Skin:    General: Skin is warm and dry.     Capillary Refill: Capillary refill takes less than 2 seconds.  Neurological:     Mental Status: She is alert and oriented to person, place, and time.  Psychiatric:        Behavior: Behavior normal.     Musculoskeletal Exam: C-spine, thoracic spine, and lumbar spine have good ROM.  Painful ROM both both shoulder joints, right > left.  Tenderness along the left elbow joint line.  Tenderness of both wrist joints, left > right.  Tenderness over the right second and third MCP and PIP joints.  Tenderness over the left second through fourth MCP and PIP joints.  Complete fist formation bilaterally.  Hip joints have good range of motion with no groin pain.  Good range of motion of both knee joints with no warmth or effusion.  Ankle joints have good range of motion with no inflammation.  CDAI Exam: CDAI Score: 1.4  Patient Global: 7 mm; Provider Global: 7 mm Swollen: 0 ; Tender: 0  Joint Exam 02/25/2021   No joint exam has been documented for this visit   There is currently no information documented on the homunculus. Go  to the Rheumatology activity and complete the homunculus joint exam.  Investigation: No additional findings.  Imaging: No results found.  Recent Labs: Lab Results  Component Value Date   WBC 4.4 11/05/2020   HGB 9.8 (L) 11/05/2020   PLT 196 11/05/2020   NA 137 11/05/2020   K 3.7 11/05/2020   CL 105 11/05/2020   CO2 23 11/05/2020   GLUCOSE 400 (H) 11/05/2020   BUN 7 11/05/2020   CREATININE 0.93 11/05/2020   BILITOT 0.4 11/05/2020   ALKPHOS 60 11/05/2020   AST 8 (L) 11/05/2020   ALT 9 11/05/2020   PROT 6.3 (L) 11/05/2020   ALBUMIN 3.1 (L) 11/05/2020   CALCIUM 8.0 (L) 11/05/2020   GFRAA 139 04/17/2020    Speciality Comments: PLQ eye exam: 01/28/2020 normal @ [facility]. Follow up in 1 year.  Procedures:  No procedures performed Allergies: Fruit extracts, Latex, and Zinc   Assessment / Plan:  Visit Diagnoses: Seronegative rheumatoid arthritis ([place]) - U/S+ syonvitis Bilateral wrists/left MCP, RF-, CCP-, 14-3-3 eta negative: She presents today with increased pain, stiffness, and intermittent inflammation involving multiple joints.  She is currently taking Plaquenil 200 mg 1 tablet by mouth twice daily.  She is tolerating Plaquenil without any side effects and has not missed any doses recently.  She has been experiencing recurrent flares up to once a week involving her hands and wrist joints.  She has been having to take Tylenol and ibuprofen more frequently to manage her symptoms.  She continues to follow-up with pain management.  She has ongoing pain in her right shoulder, left elbow, and the right knee joint. Discussed the need for further evaluation since it does not seem that Plaquenil is as effective as it was in the past.  X-rays of both hands were updated today to assess for radiographic progression.  We will also check a sed rate with her routine lab work.  She will be scheduled for an ultrasound of both hands to assess for synovitis.  She was advised to avoid  NSAID use at least 1 week prior to the ultrasound.  For now she will remain on Plaquenil as prescribed.  We will further discuss treatment options pending ultrasound and lab results.   High risk medication use - Plaquenil 200 mg 1 tablet by mouth twice daily. PLQ eye exam: 01/28/2020.  She has updated her Plaquenil eye exam so we will call to obtain these records.- Plan: CBC with Differential/Platelet, COMPLETE METABOLIC PANEL WITH GFR CBC and CMP were updated on 11/05/2020.  CBC and CMP will be updated today to monitor for drug toxicity.  Pain in both hands -She has been experiencing increased pain in both hands over the past several months.  She has not been performing any overuse activities contributing to her symptoms.  Going to the patient she has been having flares up to once a week involving her hands and wrist joints.  Earlier this week she was having significant pain and swelling in the left wrist and hand.  She continues to have ongoing tenderness in the left wrist and several MCP and PIP joints as described above but her inflammation has improved.  Discussed the need for further evaluation.  Updated x-rays of both hands were obtained today to assess for radiographic progression.  ESR will also be checked with her routine lab work.  She will be scheduled for an ultrasound of both hands to assess for synovitis.  She was advised to avoid taking NSAIDs at least 1 week prior to the ultrasound.  She voiced understanding.  We will further discuss treatment options pending ultrasound results.  Plan: Sedimentation rate, XR Hand 2 View Left, XR Hand 2 View Right  Carpal tunnel syndrome, right upper limb: She experiences occasional paresthesias in the right hand.  Overall her symptoms have been infrequent and tolerable recently.  Patellofemoral disorder of left knee: She has good range of motion of the left knee joint on examination today.  Her discomfort has been intermittent and tolerable  recently.  Chronic pain of right knee: Went to the patient she has a torn meniscus in the right knee.  She has been following up closely with pain management and recently saw an orthopedist.  She had an MRI of the right knee recently but is not ready to proceed with surgery at this time.  She has had several injections which have alleviated her discomfort.  On examination she has good range of  motion of the right knee joint with no warmth or effusion.  Chronic SI joint pain: Chronic pain  Spondylolysis, lumbar region: Chronic pain.  Followed by pain management.   Positive ANA (antinuclear antibody) - 06/17/2020 ANA negative, complements within normal limits, ESR within normal limits, double-stranded DNA negative, CK within normal limits.  She has no clinical features of systemic lupus at this time.  We discussed the symptoms to monitor for.  The following lab work will be updated today.  She was advised to notify us if she develops any new or worsening symptoms.- Plan: CBC with Differential/Platelet, COMPLETE METABOLIC PANEL WITH GFR, ANA, Anti-DNA antibody, double-stranded, C3 and C4, Sedimentation rate, Urinalysis, Routine w reflex microscopic  Trapezius muscle spasm: She has ongoing trapezius muscle tension and tenderness bilaterally.   Myofascial pain: She has ongoing myalgias and muscle tenderness consistent with myofascial pain.  Discussed that her ongoing symptoms raise a concern for fibromyalgia.  She is currently experiencing a generalized aching and has ongoing fatigue.  She takes baclofen 10 mg 3 times daily as needed for muscle spasms.  She remains on trazodone as prescribed.  Discussed the importance of regular exercise and good sleep hygiene.  Chronic pain syndrome: She continues to follow up with pain management.   Other fatigue: Chronic and secondary to insomnia.  Discussed the importance of regular exercise and good sleep hygiene.   Other medical conditions are listed as follows:    History of gastroesophageal reflux (GERD)  History of depression: She is taking Prozac as prescribed.  History of iron deficiency anemia - Referred to hematology.  Vitamin B12 deficiency  Vitamin D deficiency: She is taking vitamin D 50,000 units once weekly.   History of asthma  Diabetic gastroparesis ([place])  Type 1 diabetes mellitus with diabetic polyneuropathy ([place])  Family history of systemic lupus erythematosus    Orders: Orders Placed This Encounter  Procedures   XR Hand 2 View Left   XR Hand 2 View Right   CBC with Differential/Platelet   COMPLETE METABOLIC PANEL WITH GFR   ANA   Anti-DNA antibody, double-stranded   C3 and C4   Sedimentation rate   Urinalysis, Routine w reflex microscopic   No orders of the defined types were placed in this encounter.   Follow-Up Instructions: Return in about 2 months (around 04/28/2021) for Rheumatoid arthritis.   Ofilia Neas, PA-C  Note - This record has been created using Dragon software.  Chart creation errors have been sought, but may not always  have been located. Such creation errors do not reflect on  the standard of medical care.

## 2021-02-25 ENCOUNTER — Other Ambulatory Visit: Payer: Self-pay

## 2021-02-25 ENCOUNTER — Ambulatory Visit: Payer: Self-pay

## 2021-02-25 ENCOUNTER — Encounter: Payer: Self-pay | Admitting: Physician Assistant

## 2021-02-25 ENCOUNTER — Ambulatory Visit (INDEPENDENT_AMBULATORY_CARE_PROVIDER_SITE_OTHER): Payer: Medicaid Other | Admitting: Physician Assistant

## 2021-02-25 VITALS — BP 142/84 | HR 96 | Ht 64.0 in | Wt 256.0 lb

## 2021-02-25 DIAGNOSIS — Z8709 Personal history of other diseases of the respiratory system: Secondary | ICD-10-CM

## 2021-02-25 DIAGNOSIS — M62838 Other muscle spasm: Secondary | ICD-10-CM

## 2021-02-25 DIAGNOSIS — Z8659 Personal history of other mental and behavioral disorders: Secondary | ICD-10-CM

## 2021-02-25 DIAGNOSIS — M0600 Rheumatoid arthritis without rheumatoid factor, unspecified site: Secondary | ICD-10-CM | POA: Diagnosis not present

## 2021-02-25 DIAGNOSIS — G8929 Other chronic pain: Secondary | ICD-10-CM

## 2021-02-25 DIAGNOSIS — M533 Sacrococcygeal disorders, not elsewhere classified: Secondary | ICD-10-CM

## 2021-02-25 DIAGNOSIS — M7918 Myalgia, other site: Secondary | ICD-10-CM

## 2021-02-25 DIAGNOSIS — Z8719 Personal history of other diseases of the digestive system: Secondary | ICD-10-CM

## 2021-02-25 DIAGNOSIS — E559 Vitamin D deficiency, unspecified: Secondary | ICD-10-CM

## 2021-02-25 DIAGNOSIS — G5601 Carpal tunnel syndrome, right upper limb: Secondary | ICD-10-CM

## 2021-02-25 DIAGNOSIS — M79641 Pain in right hand: Secondary | ICD-10-CM | POA: Diagnosis not present

## 2021-02-25 DIAGNOSIS — Z79899 Other long term (current) drug therapy: Secondary | ICD-10-CM

## 2021-02-25 DIAGNOSIS — E1143 Type 2 diabetes mellitus with diabetic autonomic (poly)neuropathy: Secondary | ICD-10-CM

## 2021-02-25 DIAGNOSIS — E1042 Type 1 diabetes mellitus with diabetic polyneuropathy: Secondary | ICD-10-CM

## 2021-02-25 DIAGNOSIS — K3184 Gastroparesis: Secondary | ICD-10-CM

## 2021-02-25 DIAGNOSIS — M222X2 Patellofemoral disorders, left knee: Secondary | ICD-10-CM | POA: Diagnosis not present

## 2021-02-25 DIAGNOSIS — R768 Other specified abnormal immunological findings in serum: Secondary | ICD-10-CM

## 2021-02-25 DIAGNOSIS — M79642 Pain in left hand: Secondary | ICD-10-CM | POA: Diagnosis not present

## 2021-02-25 DIAGNOSIS — E538 Deficiency of other specified B group vitamins: Secondary | ICD-10-CM

## 2021-02-25 DIAGNOSIS — R5383 Other fatigue: Secondary | ICD-10-CM

## 2021-02-25 DIAGNOSIS — G894 Chronic pain syndrome: Secondary | ICD-10-CM

## 2021-02-25 DIAGNOSIS — M4306 Spondylolysis, lumbar region: Secondary | ICD-10-CM

## 2021-02-25 DIAGNOSIS — M25561 Pain in right knee: Secondary | ICD-10-CM

## 2021-02-25 DIAGNOSIS — Z8269 Family history of other diseases of the musculoskeletal system and connective tissue: Secondary | ICD-10-CM

## 2021-02-25 DIAGNOSIS — Z862 Personal history of diseases of the blood and blood-forming organs and certain disorders involving the immune mechanism: Secondary | ICD-10-CM

## 2021-02-25 MED ORDER — HYDROXYCHLOROQUINE SULFATE 200 MG PO TABS: ORAL_TABLET | ORAL | 0 refills | Status: DC

## 2021-02-25 NOTE — Progress Notes (Signed)
X-rays of both hands are unremarkable.  Please notify the patient.

## 2021-02-25 NOTE — Telephone Encounter (Signed)
I called patient and advised patient of x-ray results from this morning. Patient verbalized understanding.   I have called [facility] and requested updated PLQ eye exam report.   Patient requested refill of plaquenil today at appointment. Please review and send to the pharmacy. Thanks!

## 2021-02-26 NOTE — Progress Notes (Signed)
UA is consistent with a UTI.  Please notify the patient and advise her to follow up with PCP today.  She will likely require a urine culture.   Hemoglobin has improved.   Albumin is borderline low.  Rest of CMP WNL.  Complements WNL. ESR is slightly elevated-36.  If she continues to have pain in both hands she should schedule an ultrasound since her ESR is elevated.

## 2021-03-01 LAB — COMPLETE METABOLIC PANEL WITH GFR
AG Ratio: 1.1 (calc) (ref 1.0–2.5)
ALT: 11 U/L (ref 6–29)
AST: 11 U/L (ref 10–30)
Albumin: 3.5 g/dL — ABNORMAL LOW (ref 3.6–5.1)
Alkaline phosphatase (APISO): 64 U/L (ref 31–125)
BUN: 12 mg/dL (ref 7–25)
CO2: 27 mmol/L (ref 20–32)
Calcium: 8.6 mg/dL (ref 8.6–10.2)
Chloride: 107 mmol/L (ref 98–110)
Creat: 0.76 mg/dL (ref 0.50–0.96)
Globulin: 3.1 g/dL (calc) (ref 1.9–3.7)
Glucose, Bld: 95 mg/dL (ref 65–99)
Potassium: 4.1 mmol/L (ref 3.5–5.3)
Sodium: 141 mmol/L (ref 135–146)
Total Bilirubin: 0.4 mg/dL (ref 0.2–1.2)
Total Protein: 6.6 g/dL (ref 6.1–8.1)
eGFR: 109 mL/min/{1.73_m2} (ref >=60)

## 2021-03-01 LAB — URINALYSIS, ROUTINE W REFLEX MICROSCOPIC
Bilirubin Urine: NEGATIVE
Glucose, UA: NEGATIVE
Hgb urine dipstick: NEGATIVE
Hyaline Cast: NONE SEEN /LPF
Nitrite: NEGATIVE
Specific Gravity, Urine: 1.02 (ref 1.001–1.035)
pH: 6.5 (ref 5.0–8.0)

## 2021-03-01 LAB — C3 AND C4
C3 Complement: 146 mg/dL (ref 83–193)
C4 Complement: 39 mg/dL (ref 15–57)

## 2021-03-01 LAB — CBC WITH DIFFERENTIAL/PLATELET
Absolute Monocytes: 511 cells/uL (ref 200–950)
Basophils Absolute: 37 cells/uL (ref 0–200)
Basophils Relative: 0.5 %
Eosinophils Absolute: 81 cells/uL (ref 15–500)
Eosinophils Relative: 1.1 %
HCT: 35.1 % (ref 35.0–45.0)
Hemoglobin: 11.2 g/dL — ABNORMAL LOW (ref 11.7–15.5)
Lymphs Abs: 3293 cells/uL (ref 850–3900)
MCH: 27.1 pg (ref 27.0–33.0)
MCHC: 31.9 g/dL — ABNORMAL LOW (ref 32.0–36.0)
MCV: 84.8 fL (ref 80.0–100.0)
MPV: 13 fL — ABNORMAL HIGH (ref 7.5–12.5)
Monocytes Relative: 6.9 %
Neutro Abs: 3478 cells/uL (ref 1500–7800)
Neutrophils Relative %: 47 %
Platelets: 163 10*3/uL (ref 140–400)
RBC: 4.14 10*6/uL (ref 3.80–5.10)
RDW: 13.4 % (ref 11.0–15.0)
Total Lymphocyte: 44.5 %
WBC: 7.4 10*3/uL (ref 3.8–10.8)

## 2021-03-01 LAB — SEDIMENTATION RATE: Sed Rate: 36 mm/h — ABNORMAL HIGH (ref 0–20)

## 2021-03-01 LAB — ANTI-DNA ANTIBODY, DOUBLE-STRANDED: ds DNA Ab: <1 IU/mL

## 2021-03-01 LAB — ANA: Anti Nuclear Antibody (ANA): NEGATIVE

## 2021-03-01 LAB — MICROSCOPIC MESSAGE

## 2021-03-01 NOTE — Progress Notes (Signed)
dsDNA is negative

## 2021-03-01 NOTE — Progress Notes (Signed)
ANA is negative.

## 2021-03-04 ENCOUNTER — Ambulatory Visit: Payer: Medicaid Other | Admitting: Hematology and Oncology

## 2021-03-08 ENCOUNTER — Telehealth: Payer: Self-pay | Admitting: Hematology and Oncology

## 2021-03-08 NOTE — Telephone Encounter (Signed)
Rescheduled per provider. Left message with details.

## 2021-03-09 ENCOUNTER — Ambulatory Visit: Payer: Medicaid Other | Admitting: Hematology and Oncology

## 2021-03-10 NOTE — Progress Notes (Signed)
[facility] CONSULT NOTE  Patient Care Team: Jonathon Jordan, MD as PCP - General (Family Medicine)  CHIEF COMPLAINTS/PURPOSE OF CONSULTATION:  Anemia, unspecified, follow-up  ASSESSMENT & PLAN:   This is a very pleasant 30 year old female patient with juvenile diabetes, rheumatoid arthritis on Plaquenil, hypertension, obesity status post gastric sleeve surgery status post IV Venofer last dose in September 2022 here for follow-up.  Since she had her iron infusion, she felt much better.  She is very excited about her hemoglobin improvement on her most recent labs.  She has not been taking B12 supplementation, she says she was out of refills.  Her most recent CBC showed significant improvement from 9.8 g back in August 2 11.2 g/dL.  We will repeat iron labs and B12 today.  She was encouraged to return to clinic in about 4 months for further recommendations.  We will let her know if she needs to continue B12 supplementation.   Thank you for consulting [name] the care of this patient.  Please not hesitate to contact us with any additional questions or concerns  HISTORY OF PRESENTING ILLNESS:   [name] 30 y.o. female is here because of anemia.  This is a very pleasant 30 year old female patient with past medical history significant for juvenile diabetes, rheumatoid arthritis on Plaquenil, gastric sleeve surgery referred to hematology for evaluation of anemia.  During her last visit, we have agreed to proceeding with intravenous iron infusion.  She completed her Venofer on 12/02/2020 and is here for follow-up Since she completed iron infusion, she definitely noticed an improvement in her energy.  She continues to crave ice but it is not as bad as before.  She thinks it could be more of a habit at this point.  She denies any worsening shortness of breath.  She does have intermittent constipation but this is her baseline.  She has regular menstrual cycles.  She follows up with  rheumatology for her rheumatoid arthritis. Rest of the pertinent 10 point ROS reviewed and negative.  MEDICAL HISTORY:  Past Medical History:  Diagnosis Date   Anemia    Arthritis    Asthma    Diabetes mellitus without complication ([place])    Hypertension    Vitamin D deficiency     SURGICAL HISTORY: Past Surgical History:  Procedure Laterality Date   CATARACT EXTRACTION, BILATERAL Bilateral 2022   GASTRIC BYPASS  07/2017    SOCIAL HISTORY: Social History   Socioeconomic History   Marital status: Single    Spouse name: Not on file   Number of children: Not on file   Years of education: Not on file   Highest education level: Not on file  Occupational History   Not on file  Tobacco Use   Smoking status: Never   Smokeless tobacco: Never  Vaping Use   Vaping Use: Never used  Substance and Sexual Activity   Alcohol use: Yes    Comment: occ   Drug use: Never   Sexual activity: Yes    Birth control/protection: None  Other Topics Concern   Not on file  Social History Narrative   Not on file   Social Determinants of Health   Financial Resource Strain: Not on file  Food Insecurity: Not on file  Transportation Needs: Not on file  Physical Activity: Not on file  Stress: Not on file  Social Connections: Not on file  Intimate Partner Violence: Not on file    FAMILY HISTORY: Family History  Problem Relation  Age of Onset   Lupus Maternal Grandmother    Asthma Sister    ADD / ADHD Sister     ALLERGIES:  is allergic to fruit extracts, latex, and zinc.  MEDICATIONS:  Current Outpatient Medications  Medication Sig Dispense Refill   albuterol (PROVENTIL HFA;VENTOLIN HFA) 108 (90 Base) MCG/ACT inhaler Inhale 2 puffs into the lungs every 4 (four) hours as needed for wheezing.     baclofen (LIORESAL) 10 MG tablet Take 10 mg by mouth 3 (three) times daily as needed for muscle spasms.     ergocalciferol (VITAMIN D2) 1.25 MG (50000 UT) capsule Take 50,000 Units by mouth  once a week. Wednesday     ferrous sulfate 325 (65 FE) MG tablet Take 325 mg by mouth daily.     FLUoxetine (PROZAC) 40 MG capsule Take 40 mg by mouth every morning. (Patient not taking: Reported on 02/25/2021)     HUMALOG 100 UNIT/ML injection Inject 100 Units as directed See admin instructions. Inject up to 100 units per day via insulin pump as directed  9   HYDROcodone-acetaminophen (NORCO/VICODIN) 5-325 MG tablet Take 1 tablet by mouth every 6 (six) hours as needed for moderate pain or severe pain. (Patient not taking: Reported on 02/25/2021) 30 tablet 0   hydroxychloroquine (PLAQUENIL) 200 MG tablet TAKE 1 TABLET(200 MG) BY MOUTH TWICE DAILY 180 tablet 0   Lactobacillus (PROBIOTIC ACIDOPHILUS) TABS Take 1 tablet by mouth daily.     methocarbamol (ROBAXIN) 500 MG tablet Take 1 tablet (500 mg total) by mouth 2 (two) times daily. (Patient not taking: Reported on 02/25/2021) 20 tablet 0   ondansetron (ZOFRAN ODT) 4 MG disintegrating tablet Take 1 tablet (4 mg total) by mouth every 8 (eight) hours as needed for up to 15 doses for nausea or vomiting. 15 tablet 0   prazosin (MINIPRESS) 2 MG capsule Take 2 mg by mouth at bedtime.     traZODone (DESYREL) 50 MG tablet Take 100 mg by mouth at bedtime as needed for sleep.     triamcinolone cream (KENALOG) 0.1 % Apply 1 application topically as needed (dry, itch skin). Apply 1-2 times a day to affected areas of dry itchy skin on the body until clear, then as needed. Not to face.     valsartan (DIOVAN) 160 MG tablet Take 160 mg by mouth daily.     No current facility-administered medications for this visit.     PHYSICAL EXAMINATION:  ECOG PERFORMANCE STATUS: 0 - Asymptomatic  Vitals:   03/11/21 1337  BP: (!) 157/89  Pulse: (!) 104  Resp: 16  Temp: 98 F (36.7 C)  SpO2: 99%     Filed Weights   03/11/21 1337  Weight: 265 lb 3 oz (120.3 kg)   Physical Exam Constitutional:      Appearance: Normal appearance.  HENT:     Head: Normocephalic  and atraumatic.  Cardiovascular:     Rate and Rhythm: Normal rate and regular rhythm.     Pulses: Normal pulses.     Heart sounds: Normal heart sounds.  Pulmonary:     Effort: Pulmonary effort is normal.     Breath sounds: Normal breath sounds.  Musculoskeletal:        General: No swelling or tenderness.     Cervical back: Normal range of motion and neck supple. No rigidity.  Lymphadenopathy:     Cervical: No cervical adenopathy.  Skin:    General: Skin is warm.  Neurological:     General: No  focal deficit present.     Mental Status: She is alert.  Psychiatric:        Mood and Affect: Mood normal.     LABORATORY DATA:  I have reviewed the data as listed Lab Results  Component Value Date   WBC 7.4 02/25/2021   HGB 11.2 (L) 02/25/2021   HCT 35.1 02/25/2021   MCV 84.8 02/25/2021   PLT 163 02/25/2021     Chemistry      Component Value Date/Time   NA 141 02/25/2021 0916   K 4.1 02/25/2021 0916   CL 107 02/25/2021 0916   CO2 27 02/25/2021 0916   BUN 12 02/25/2021 0916   CREATININE 0.76 02/25/2021 0916      Component Value Date/Time   CALCIUM 8.6 02/25/2021 0916   ALKPHOS 60 11/05/2020 1012   AST 11 02/25/2021 0916   ALT 11 02/25/2021 0916   BILITOT 0.4 02/25/2021 0916       RADIOGRAPHIC STUDIES: I have personally reviewed the radiological images as listed and agreed with the findings in the report. XR Hand 2 View Left  Result Date: 02/25/2021 No MCP, PIP, DIP, intercarpal or radiocarpal joint space narrowing was noted.  No erosive changes were noted.  No radiographic progression was noted when compared to the x-rays of 2020. Impression: Unremarkable x-ray of the hand.  XR Hand 2 View Right  Result Date: 02/25/2021 No MCP, PIP, DIP, intercarpal or radiocarpal joint space narrowing was noted.  No erosive changes were noted.  No radiographic progression was noted when compared to the x-rays of 2020. Impression: Unremarkable x-ray of the hand.   All questions were  answered. The patient knows to call the clinic with any problems, questions or concerns.      Rachel Moulds, MD 03/11/2021 2:01 PM

## 2021-03-11 ENCOUNTER — Encounter: Payer: Self-pay | Admitting: Hematology and Oncology

## 2021-03-11 ENCOUNTER — Inpatient Hospital Stay: Payer: Medicaid Other

## 2021-03-11 ENCOUNTER — Ambulatory Visit: Payer: Medicaid Other | Admitting: Hematology and Oncology

## 2021-03-11 ENCOUNTER — Other Ambulatory Visit: Payer: Self-pay

## 2021-03-11 ENCOUNTER — Inpatient Hospital Stay: Payer: Medicaid Other | Attending: Hematology and Oncology | Admitting: Hematology and Oncology

## 2021-03-11 VITALS — BP 157/89 | HR 104 | Temp 98.0°F | Resp 16 | Wt 265.2 lb

## 2021-03-11 DIAGNOSIS — E538 Deficiency of other specified B group vitamins: Secondary | ICD-10-CM

## 2021-03-11 DIAGNOSIS — E611 Iron deficiency: Secondary | ICD-10-CM | POA: Insufficient documentation

## 2021-03-11 DIAGNOSIS — D500 Iron deficiency anemia secondary to blood loss (chronic): Secondary | ICD-10-CM

## 2021-03-11 LAB — CBC WITH DIFFERENTIAL/PLATELET
Abs Immature Granulocytes: 0.01 10*3/uL (ref 0.00–0.07)
Basophils Absolute: 0 10*3/uL (ref 0.0–0.1)
Basophils Relative: 1 %
Eosinophils Absolute: 0.1 10*3/uL (ref 0.0–0.5)
Eosinophils Relative: 1 %
HCT: 33.9 % — ABNORMAL LOW (ref 36.0–46.0)
Hemoglobin: 10.6 g/dL — ABNORMAL LOW (ref 12.0–15.0)
Immature Granulocytes: 0 %
Lymphocytes Relative: 38 %
Lymphs Abs: 1.9 10*3/uL (ref 0.7–4.0)
MCH: 26.6 pg (ref 26.0–34.0)
MCHC: 31.3 g/dL (ref 30.0–36.0)
MCV: 85.2 fL (ref 80.0–100.0)
Monocytes Absolute: 0.3 10*3/uL (ref 0.1–1.0)
Monocytes Relative: 5 %
Neutro Abs: 2.7 10*3/uL (ref 1.7–7.7)
Neutrophils Relative %: 55 %
Platelets: 225 10*3/uL (ref 150–400)
RBC: 3.98 MIL/uL (ref 3.87–5.11)
RDW: 13.7 % (ref 11.5–15.5)
WBC: 5 10*3/uL (ref 4.0–10.5)
nRBC: 0 % (ref 0.0–0.2)

## 2021-03-11 LAB — FERRITIN: Ferritin: 112 ng/mL (ref 11–307)

## 2021-03-11 LAB — IRON AND IRON BINDING CAPACITY (CC-WL,HP ONLY)
Iron: 40 ug/dL (ref 28–170)
Saturation Ratios: 15 % (ref 10.4–31.8)
TIBC: 266 ug/dL (ref 250–450)
UIBC: 226 ug/dL (ref 148–442)

## 2021-03-11 LAB — VITAMIN B12: Vitamin B-12: 194 pg/mL (ref 180–914)

## 2021-03-12 ENCOUNTER — Telehealth: Payer: Self-pay

## 2021-03-12 NOTE — Telephone Encounter (Signed)
Pt advised with verbal understanding  °

## 2021-03-12 NOTE — Telephone Encounter (Signed)
-----   Message from Rachel Moulds, MD sent at 03/12/2021  1:16 PM EST ----- [name]  Can you let her know iron labs are looking good. B12 is low normal, she can take OTC B12 1000 mcg daily.

## 2021-04-08 ENCOUNTER — Other Ambulatory Visit: Payer: Medicaid Other | Admitting: Rheumatology

## 2021-04-22 NOTE — Progress Notes (Deleted)
Office Visit Note  Patient: [name]             Date of Birth: 07-14-1990           MRN: [id number]             PCP: Jonathon Jordan, MD Referring: Jonathon Jordan, MD Visit Date: 05/05/2021 Occupation: @GUAROCC @  Subjective:  No chief complaint on file.   History of Present Illness: [name] is a 31 y.o. female ***   Activities of Daily Living:  Patient reports morning stiffness for *** {minute/hour:19697}.   Patient {ACTIONS;DENIES/REPORTS:21021675::"Denies"} nocturnal pain.  Difficulty dressing/grooming: {ACTIONS;DENIES/REPORTS:21021675::"Denies"} Difficulty climbing stairs: {ACTIONS;DENIES/REPORTS:21021675::"Denies"} Difficulty getting out of chair: {ACTIONS;DENIES/REPORTS:21021675::"Denies"} Difficulty using hands for taps, buttons, cutlery, and/or writing: {ACTIONS;DENIES/REPORTS:21021675::"Denies"}  No Rheumatology ROS completed.   PMFS History:  Patient Active Problem List   Diagnosis Date Noted   IDA (iron deficiency anemia) 11/06/2020   Intractable abdominal pain 07/26/2020   Right upper quadrant abdominal pain 07/26/2020   Acute pyelonephritis 07/26/2020   Diabetes mellitus type 1, controlled, with complications ([place]) 123456   Obesity, Class III, BMI 40-49.9 (morbid obesity) ([place]) 07/26/2020   Enterococcus faecalis infection 07/26/2020   Essential hypertension 07/26/2020   RUQ abdominal pain 07/26/2020   Chronic low back pain 11/22/2013   Clinical depression 10/07/2013   Extreme obesity 10/07/2013   Diabetic neuropathy ([place]) 08/06/2013   General patient noncompliance 12/21/2012   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[place]) 12/30/2010   Appetite disorder 12/30/2010   BP (high blood pressure) 12/30/2010   Muscle ache 12/30/2010   Cachectic ([place]) 12/30/2010   Patellofemoral disorder of left knee 09/03/2010   Diabetes mellitus 09/03/2010    Past Medical History:  Diagnosis Date   Anemia    Arthritis    Asthma    Diabetes mellitus without complication ([place])     Hypertension    Vitamin D deficiency     Family History  Problem Relation Age of Onset   Lupus Maternal Grandmother    Asthma Sister    ADD / ADHD Sister    Past Surgical History:  Procedure Laterality Date   CATARACT EXTRACTION, BILATERAL Bilateral 2022   GASTRIC BYPASS  07/2017   Social History   Social History Narrative   Not on file   Immunization History  Administered Date(s) Administered   Influenza Inj Mdck Quad Pf 02/11/2018   PFIZER(Purple Top)SARS-COV-2 Vaccination 05/28/2019, 06/18/2019, 03/06/2020, 09/13/2020   Pneumococcal Polysaccharide-23 10/07/2013     Objective: Vital Signs: There were no vitals taken for this visit.   Physical Exam   Musculoskeletal Exam: ***  CDAI Exam: CDAI Score: -- Patient Global: --; Provider Global: -- Swollen: --; Tender: -- Joint Exam 05/05/2021   No joint exam has been documented for this visit   There is currently no information documented on the homunculus. Go to the Rheumatology activity and complete the homunculus joint exam.  Investigation: No additional findings.  Imaging: No results found.  Recent Labs: Lab Results  Component Value Date   WBC 5.0 03/11/2021   HGB 10.6 (L) 03/11/2021   PLT 225 03/11/2021   NA 141 02/25/2021   K 4.1 02/25/2021   CL 107 02/25/2021   CO2 27 02/25/2021   GLUCOSE 95 02/25/2021   BUN 12 02/25/2021   CREATININE 0.76 02/25/2021   BILITOT 0.4 02/25/2021   ALKPHOS 60 11/05/2020   AST 11 02/25/2021   ALT 11 02/25/2021   PROT 6.6 02/25/2021   ALBUMIN 3.1 (L) 11/05/2020   CALCIUM 8.6 02/25/2021  GFRAA 139 04/17/2020    Speciality Comments: PLQ eye exam: 01/17/2021 normal @ [facility]. Follow up in 5 years.  Procedures:  No procedures performed Allergies: Fruit extracts, Latex, and Zinc   Assessment / Plan:     Visit Diagnoses: No diagnosis found.  Orders: No orders of the defined types were placed in this encounter.  No orders of the defined  types were placed in this encounter.   Face-to-face time spent with patient was *** minutes. Greater than 50% of time was spent in counseling and coordination of care.  Follow-Up Instructions: No follow-ups on file.   Earnestine Mealing, CMA  Note - This record has been created using Editor, commissioning.  Chart creation errors have been sought, but may not always  have been located. Such creation errors do not reflect on  the standard of medical care.

## 2021-05-05 ENCOUNTER — Ambulatory Visit: Payer: Medicaid Other | Admitting: Rheumatology

## 2021-05-05 DIAGNOSIS — R5383 Other fatigue: Secondary | ICD-10-CM

## 2021-05-05 DIAGNOSIS — G8929 Other chronic pain: Secondary | ICD-10-CM

## 2021-05-05 DIAGNOSIS — Z8659 Personal history of other mental and behavioral disorders: Secondary | ICD-10-CM

## 2021-05-05 DIAGNOSIS — M4306 Spondylolysis, lumbar region: Secondary | ICD-10-CM

## 2021-05-05 DIAGNOSIS — R768 Other specified abnormal immunological findings in serum: Secondary | ICD-10-CM

## 2021-05-05 DIAGNOSIS — M7918 Myalgia, other site: Secondary | ICD-10-CM

## 2021-05-05 DIAGNOSIS — E538 Deficiency of other specified B group vitamins: Secondary | ICD-10-CM

## 2021-05-05 DIAGNOSIS — Z8719 Personal history of other diseases of the digestive system: Secondary | ICD-10-CM

## 2021-05-05 DIAGNOSIS — E1143 Type 2 diabetes mellitus with diabetic autonomic (poly)neuropathy: Secondary | ICD-10-CM

## 2021-05-05 DIAGNOSIS — Z862 Personal history of diseases of the blood and blood-forming organs and certain disorders involving the immune mechanism: Secondary | ICD-10-CM

## 2021-05-05 DIAGNOSIS — M62838 Other muscle spasm: Secondary | ICD-10-CM

## 2021-05-05 DIAGNOSIS — G894 Chronic pain syndrome: Secondary | ICD-10-CM

## 2021-05-05 DIAGNOSIS — G5601 Carpal tunnel syndrome, right upper limb: Secondary | ICD-10-CM

## 2021-05-05 DIAGNOSIS — Z8269 Family history of other diseases of the musculoskeletal system and connective tissue: Secondary | ICD-10-CM

## 2021-05-05 DIAGNOSIS — M0600 Rheumatoid arthritis without rheumatoid factor, unspecified site: Secondary | ICD-10-CM

## 2021-05-05 DIAGNOSIS — Z8709 Personal history of other diseases of the respiratory system: Secondary | ICD-10-CM

## 2021-05-05 DIAGNOSIS — Z79899 Other long term (current) drug therapy: Secondary | ICD-10-CM

## 2021-05-05 DIAGNOSIS — M222X2 Patellofemoral disorders, left knee: Secondary | ICD-10-CM

## 2021-05-05 DIAGNOSIS — E1042 Type 1 diabetes mellitus with diabetic polyneuropathy: Secondary | ICD-10-CM

## 2021-05-05 DIAGNOSIS — E559 Vitamin D deficiency, unspecified: Secondary | ICD-10-CM

## 2021-05-20 NOTE — Progress Notes (Deleted)
? ?Office Visit Note ? ?Patient: [name]             ?Date of Birth: 05-Mar-1991           ?MRN: [id number]             ?PCP: Jonathon Jordan, MD ?Referring: Jonathon Jordan, MD ?Visit Date: 06/02/2021 ?Occupation: @GUAROCC @ ? ?Subjective:  ?No chief complaint on file. ? ? ?History of Present Illness: [name] is a 31 y.o. female ***  ? ?Activities of Daily Living:  ?Patient reports morning stiffness for *** {minute/hour:19697}.   ?Patient {ACTIONS;DENIES/REPORTS:21021675::"Denies"} nocturnal pain.  ?Difficulty dressing/grooming: {ACTIONS;DENIES/REPORTS:21021675::"Denies"} ?Difficulty climbing stairs: {ACTIONS;DENIES/REPORTS:21021675::"Denies"} ?Difficulty getting out of chair: {ACTIONS;DENIES/REPORTS:21021675::"Denies"} ?Difficulty using hands for taps, buttons, cutlery, and/or writing: {ACTIONS;DENIES/REPORTS:21021675::"Denies"} ? ?No Rheumatology ROS completed.  ? ?PMFS History:  ?Patient Active Problem List  ? Diagnosis Date Noted  ? IDA (iron deficiency anemia) 11/06/2020  ? Intractable abdominal pain 07/26/2020  ? Right upper quadrant abdominal pain 07/26/2020  ? Acute pyelonephritis 07/26/2020  ? Diabetes mellitus type 1, controlled, with complications ([place]) 123456  ? Obesity, Class III, BMI 40-49.9 (morbid obesity) ([place]) 07/26/2020  ? Enterococcus faecalis infection 07/26/2020  ? Essential hypertension 07/26/2020  ? RUQ abdominal pain 07/26/2020  ? Chronic low back pain 11/22/2013  ? Clinical depression 10/07/2013  ? Extreme obesity 10/07/2013  ? Diabetic neuropathy ([place]) 08/06/2013  ? General patient noncompliance 12/21/2012  ? 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 Appetite disorder 12/30/2010  ? BP (high blood pressure) 12/30/2010  ? Muscle ache 12/30/2010  ? Cachectic ([place]) 12/30/2010  ? Patellofemoral disorder of left knee 09/03/2010  ? Diabetes mellitus 09/03/2010  ?  ?Past Medical History:  ?Diagnosis Date  ? Anemia   ? Arthritis   ? Asthma   ? Diabetes mellitus without complication ([place])    ? Hypertension   ? Vitamin D deficiency   ?  ?Family History  ?Problem Relation Age of Onset  ? Lupus Maternal Grandmother   ? Asthma Sister   ? ADD / ADHD Sister   ? ?Past Surgical History:  ?Procedure Laterality Date  ? CATARACT EXTRACTION, BILATERAL Bilateral 2022  ? GASTRIC BYPASS  07/2017  ? ?Social History  ? ?Social History Narrative  ? Not on file  ? ?Immunization History  ?Administered Date(s) Administered  ? Influenza Inj Mdck Quad Pf 02/11/2018  ? PFIZER(Purple Top)SARS-COV-2 Vaccination 05/28/2019, 06/18/2019, 03/06/2020, 09/13/2020  ? Pneumococcal Polysaccharide-23 10/07/2013  ?  ? ?Objective: ?Vital Signs: There were no vitals taken for this visit.  ? ?Physical Exam  ? ?Musculoskeletal Exam: *** ? ?CDAI Exam: ?CDAI Score: -- ?Patient Global: --; Provider Global: -- ?Swollen: --; Tender: -- ?Joint Exam 06/02/2021  ? ?No joint exam has been documented for this visit  ? ?There is currently no information documented on the homunculus. Go to the Rheumatology activity and complete the homunculus joint exam. ? ?Investigation: ?No additional findings. ? ?Imaging: ?No results found. ? ?Recent Labs: ?Lab Results  ?Component Value Date  ? WBC 5.0 03/11/2021  ? HGB 10.6 (L) 03/11/2021  ? PLT 225 03/11/2021  ? NA 141 02/25/2021  ? K 4.1 02/25/2021  ? CL 107 02/25/2021  ? CO2 27 02/25/2021  ? GLUCOSE 95 02/25/2021  ? BUN 12 02/25/2021  ? CREATININE 0.76 02/25/2021  ? BILITOT 0.4 02/25/2021  ? ALKPHOS 60 11/05/2020  ? AST 11 02/25/2021  ? ALT 11 02/25/2021  ? PROT 6.6 02/25/2021  ? ALBUMIN 3.1 (L) 11/05/2020  ? CALCIUM 8.6 02/25/2021  ?  GFRAA 139 04/17/2020  ? ? ?Speciality Comments: PLQ eye exam: 01/17/2021 normal @ [facility]. Follow up in 5 years. ? ?Procedures:  ?No procedures performed ?Allergies: Fruit extracts, Latex, and Zinc  ? ?Assessment / Plan:     ?Visit Diagnoses: No diagnosis found. ? ?Orders: ?No orders of the defined types were placed in this encounter. ? ?No orders of the defined  types were placed in this encounter. ? ? ?Face-to-face time spent with patient was *** minutes. Greater than 50% of time was spent in counseling and coordination of care. ? ?Follow-Up Instructions: No follow-ups on file. ? ? ?Earnestine Mealing, CMA ? ?Note - This record has been created using [employer].  ?Chart creation errors have been sought, but may not always  ?have been located. Such creation errors do not reflect on  ?the standard of medical care.  ?

## 2021-05-27 ENCOUNTER — Ambulatory Visit (INDEPENDENT_AMBULATORY_CARE_PROVIDER_SITE_OTHER): Payer: Medicaid Other | Admitting: Rheumatology

## 2021-05-27 ENCOUNTER — Ambulatory Visit: Payer: Self-pay

## 2021-05-27 ENCOUNTER — Other Ambulatory Visit: Payer: Self-pay

## 2021-05-27 DIAGNOSIS — M79641 Pain in right hand: Secondary | ICD-10-CM | POA: Diagnosis not present

## 2021-05-27 DIAGNOSIS — M79642 Pain in left hand: Secondary | ICD-10-CM | POA: Diagnosis not present

## 2021-05-27 DIAGNOSIS — G5601 Carpal tunnel syndrome, right upper limb: Secondary | ICD-10-CM

## 2021-05-27 DIAGNOSIS — M0600 Rheumatoid arthritis without rheumatoid factor, unspecified site: Secondary | ICD-10-CM

## 2021-06-02 ENCOUNTER — Ambulatory Visit: Payer: Medicaid Other | Admitting: Rheumatology

## 2021-06-02 DIAGNOSIS — Z8709 Personal history of other diseases of the respiratory system: Secondary | ICD-10-CM

## 2021-06-02 DIAGNOSIS — R768 Other specified abnormal immunological findings in serum: Secondary | ICD-10-CM

## 2021-06-02 DIAGNOSIS — M0600 Rheumatoid arthritis without rheumatoid factor, unspecified site: Secondary | ICD-10-CM

## 2021-06-02 DIAGNOSIS — Z79899 Other long term (current) drug therapy: Secondary | ICD-10-CM

## 2021-06-02 DIAGNOSIS — R5383 Other fatigue: Secondary | ICD-10-CM

## 2021-06-02 DIAGNOSIS — G894 Chronic pain syndrome: Secondary | ICD-10-CM

## 2021-06-02 DIAGNOSIS — M7918 Myalgia, other site: Secondary | ICD-10-CM

## 2021-06-02 DIAGNOSIS — Z8659 Personal history of other mental and behavioral disorders: Secondary | ICD-10-CM

## 2021-06-02 DIAGNOSIS — Z8269 Family history of other diseases of the musculoskeletal system and connective tissue: Secondary | ICD-10-CM

## 2021-06-02 DIAGNOSIS — Z862 Personal history of diseases of the blood and blood-forming organs and certain disorders involving the immune mechanism: Secondary | ICD-10-CM

## 2021-06-02 DIAGNOSIS — M62838 Other muscle spasm: Secondary | ICD-10-CM

## 2021-06-02 DIAGNOSIS — E559 Vitamin D deficiency, unspecified: Secondary | ICD-10-CM

## 2021-06-02 DIAGNOSIS — G8929 Other chronic pain: Secondary | ICD-10-CM

## 2021-06-02 DIAGNOSIS — K3184 Gastroparesis: Secondary | ICD-10-CM

## 2021-06-02 DIAGNOSIS — Z8719 Personal history of other diseases of the digestive system: Secondary | ICD-10-CM

## 2021-06-02 DIAGNOSIS — G5601 Carpal tunnel syndrome, right upper limb: Secondary | ICD-10-CM

## 2021-06-02 DIAGNOSIS — E538 Deficiency of other specified B group vitamins: Secondary | ICD-10-CM

## 2021-06-02 DIAGNOSIS — M4306 Spondylolysis, lumbar region: Secondary | ICD-10-CM

## 2021-06-02 DIAGNOSIS — E1042 Type 1 diabetes mellitus with diabetic polyneuropathy: Secondary | ICD-10-CM

## 2021-06-02 DIAGNOSIS — M222X2 Patellofemoral disorders, left knee: Secondary | ICD-10-CM

## 2021-06-24 ENCOUNTER — Ambulatory Visit: Payer: Self-pay | Admitting: General Surgery

## 2021-06-24 NOTE — H&P (Signed)
?Chief Complaint: New Consultation ?  ?  ?  ?History of Present Illness: ?[name] is a 31 y.o. female who is seen today as an office consultation at the request of Dr. Ranae Pila for evaluation of New Consultation ?Marland Kitchen   ?Patient is a 31 year old female with a history of approximate 1 year of abdominal pain.  States is mainly right upper quadrant and does spread out to the epigastrium left upper quadrant.  Patient's had multitude of work-up to include HIDA scan, ultrasound labs.  Patient's HIDA scan was normal however she does have pain with Pedialyte.  Patient had ultrasound with a positive Murphy sign and seen to have no gallstones. ?  ?Patient states that her pain is usually associate with nausea vomiting.  She has been to the ER several times secondary to pain. ?  ?She had previous gastric sleeve surgery done laparoscopically in the past. ?  ?  ?  ?  ?  ?Review of Systems: ?A complete review of systems was obtained from the patient.  I have reviewed this information and discussed as appropriate with the patient.  See HPI as well for other ROS. ?  ?Review of Systems  ?Constitutional: Negative for fever.  ?HENT: Negative for congestion.   ?Eyes: Negative for blurred vision.  ?Respiratory: Negative for cough, shortness of breath and wheezing.   ?Cardiovascular: Negative for chest pain and palpitations.  ?Gastrointestinal: Positive for abdominal pain, nausea and vomiting. Negative for heartburn.  ?Genitourinary: Negative for dysuria.  ?Musculoskeletal: Negative for myalgias.  ?Skin: Negative for rash.  ?Neurological: Negative for dizziness and headaches.  ?Psychiatric/Behavioral: Negative for depression and suicidal ideas.  ?All other systems reviewed and are negative. ?  ?  ?  ?Medical History: ?Past Medical History ?Past Medical History: ?Diagnosis Date ? Anemia   ? Anxiety   ? ?  ?  ?There is no problem list on file for this patient. ?  ?  ?Past Surgical History ?Past Surgical  History: ?Procedure Laterality Date ? LAPAROSCOPIC SLEEVE GASTRECTOMY     ? ?  ?  ?Allergies ?Allergies ?Allergen Reactions ? Zinc Anaphylaxis, Itching and Other (See Comments) ? Fruit Extracts Itching ?    All fruits some worse than others ?All fruits some worse than others ?  ? Latex Itching, Other (See Comments) and Swelling ? Other Itching ?    Fruits ? ?  ?  ?Current Outpatient Medications on File Prior to Visit ?Medication Sig Dispense Refill ? baclofen (LIORESAL) 10 MG tablet Take 10 mg by mouth 3 (three) times daily as needed     ? ergocalciferol, vitamin D2, 1,250 mcg (50,000 unit) capsule       ? FLUoxetine (PROZAC) 40 MG capsule 1 capsule     ? HUMALOG U-100 INSULIN injection (concentration 100 units/mL) USE UP TO 100 UNITS PER DAY IN INSULIN PUMP     ? hydrOXYchloroQUINE (PLAQUENIL) 200 mg tablet 1 tablet     ? pantoprazole (PROTONIX) 40 MG DR tablet TAKE 1 TABLET BY MOUTH EVERY DAY 30 MINUTES BEFORE A MEAL ON AN EMPTY STOMACH     ? traZODone (DESYREL) 50 MG tablet 2 tablets at bedtime as needed     ?  ?No current facility-administered medications on file prior to visit. ?  ?  ?Family History ?Family History ?Problem Relation Age of Onset ? Obesity Mother   ? ?  ?  ?Social History ?  ?Tobacco Use ?Smoking Status Never ?Smokeless Tobacco Never ?  ?  ?Social History ?Social History ?  ? ?  Socioeconomic History ? Marital status: Single ?Tobacco Use ? Smoking status: Never ? Smokeless tobacco: Never ? ?  ?  ?Objective: ?  ?  ?Vitals: ?  06/24/21 1353 ?Pulse: 95 ?Weight: (!) 115.8 kg (255 lb 3.2 oz) ?Height: 162.6 cm (5\' 4" ) ?  ?Body mass index is 43.8 kg/m?. ?  ?Physical Exam ?Constitutional:   ?   Appearance: Normal appearance.  ?HENT:  ?   Head: Normocephalic and atraumatic.  ?   Mouth/Throat:  ?   Mouth: Mucous membranes are moist.  ?   Pharynx: Oropharynx is clear.  ?Eyes:  ?   General: No scleral icterus. ?   Pupils: Pupils are equal, round, and reactive to light.  ?Cardiovascular:  ?   Rate and Rhythm:  Normal rate and regular rhythm.  ?   Pulses: Normal pulses.  ?   Heart sounds: No murmur heard. ?  No friction rub. No gallop.  ?Pulmonary:  ?   Effort: Pulmonary effort is normal. No respiratory distress.  ?   Breath sounds: Normal breath sounds. No stridor.  ?Abdominal:  ?   General: Abdomen is flat.  ?Musculoskeletal:     ?   General: No swelling.  ?Skin: ?   General: Skin is warm.  ?Neurological:  ?   General: No focal deficit present.  ?   Mental Status: She is alert and oriented to person, place, and time. Mental status is at baseline.  ?Psychiatric:     ?   Mood and Affect: Mood normal.     ?   Thought Content: Thought content normal.     ?   Judgment: Judgment normal.  ?  ?  ?  ?Assessment and Plan: ?Diagnoses and all orders for this visit: ?  ?Symptomatic cholelithiasis ?  ?  ?[name] is a 31 y.o. female  ?  ?1.  We will proceed to the OR for a lap cholecystectomy. ?2. All risks and benefits were discussed with the patient to generally include: infection, bleeding, possible need for post op ERCP, damage to the bile ducts, and bile leak. Alternatives were offered and described.  All questions were answered and the patient voiced understanding of the procedure and wishes to proceed at this point with a laparoscopic cholecystectomy ?  ?  ?  ?  ?  ?No follow-ups on file. ?  ?Ralene Ok, MD, FACS ?[facility], [place] ?General & Minimally Invasive Surgery ? ?

## 2021-07-08 ENCOUNTER — Inpatient Hospital Stay: Payer: Medicaid Other | Admitting: Hematology and Oncology

## 2021-07-20 ENCOUNTER — Inpatient Hospital Stay: Payer: Medicaid Other | Admitting: Hematology and Oncology

## 2021-08-20 ENCOUNTER — Encounter: Payer: Self-pay | Admitting: Hematology and Oncology

## 2021-08-20 ENCOUNTER — Other Ambulatory Visit: Payer: Self-pay

## 2021-08-20 ENCOUNTER — Inpatient Hospital Stay: Payer: Medicaid Other | Attending: Hematology and Oncology | Admitting: Hematology and Oncology

## 2021-08-20 ENCOUNTER — Inpatient Hospital Stay: Payer: Medicaid Other

## 2021-08-20 VITALS — BP 147/87 | HR 99 | Temp 98.8°F | Resp 18 | Ht 64.0 in | Wt 259.7 lb

## 2021-08-20 DIAGNOSIS — Z9884 Bariatric surgery status: Secondary | ICD-10-CM | POA: Insufficient documentation

## 2021-08-20 DIAGNOSIS — E538 Deficiency of other specified B group vitamins: Secondary | ICD-10-CM

## 2021-08-20 DIAGNOSIS — E108 Type 1 diabetes mellitus with unspecified complications: Secondary | ICD-10-CM | POA: Diagnosis not present

## 2021-08-20 DIAGNOSIS — D509 Iron deficiency anemia, unspecified: Secondary | ICD-10-CM | POA: Insufficient documentation

## 2021-08-20 DIAGNOSIS — Z79899 Other long term (current) drug therapy: Secondary | ICD-10-CM | POA: Diagnosis not present

## 2021-08-20 DIAGNOSIS — Z794 Long term (current) use of insulin: Secondary | ICD-10-CM | POA: Diagnosis not present

## 2021-08-20 DIAGNOSIS — D500 Iron deficiency anemia secondary to blood loss (chronic): Secondary | ICD-10-CM

## 2021-08-20 DIAGNOSIS — M069 Rheumatoid arthritis, unspecified: Secondary | ICD-10-CM | POA: Insufficient documentation

## 2021-08-20 LAB — CMP (CANCER CENTER ONLY)
ALT: 11 U/L (ref 0–44)
AST: 14 U/L — ABNORMAL LOW (ref 15–41)
Albumin: 3.3 g/dL — ABNORMAL LOW (ref 3.5–5.0)
Alkaline Phosphatase: 63 U/L (ref 38–126)
Anion gap: 6 (ref 5–15)
BUN: 11 mg/dL (ref 6–20)
CO2: 25 mmol/L (ref 22–32)
Calcium: 8.6 mg/dL — ABNORMAL LOW (ref 8.9–10.3)
Chloride: 105 mmol/L (ref 98–111)
Creatinine: 0.84 mg/dL (ref 0.44–1.00)
GFR, Estimated: >60 mL/min (ref >60)
Glucose, Bld: 400 mg/dL — ABNORMAL HIGH (ref 70–99)
Potassium: 4.2 mmol/L (ref 3.5–5.1)
Sodium: 136 mmol/L (ref 135–145)
Total Bilirubin: 0.6 mg/dL (ref 0.3–1.2)
Total Protein: 7 g/dL (ref 6.5–8.1)

## 2021-08-20 LAB — CBC WITH DIFFERENTIAL/PLATELET
Abs Immature Granulocytes: 0.01 10*3/uL (ref 0.00–0.07)
Basophils Absolute: 0 10*3/uL (ref 0.0–0.1)
Basophils Relative: 1 %
Eosinophils Absolute: 0.1 10*3/uL (ref 0.0–0.5)
Eosinophils Relative: 1 %
HCT: 32.9 % — ABNORMAL LOW (ref 36.0–46.0)
Hemoglobin: 10.8 g/dL — ABNORMAL LOW (ref 12.0–15.0)
Immature Granulocytes: 0 %
Lymphocytes Relative: 37 %
Lymphs Abs: 1.8 10*3/uL (ref 0.7–4.0)
MCH: 26.7 pg (ref 26.0–34.0)
MCHC: 32.8 g/dL (ref 30.0–36.0)
MCV: 81.4 fL (ref 80.0–100.0)
Monocytes Absolute: 0.3 10*3/uL (ref 0.1–1.0)
Monocytes Relative: 7 %
Neutro Abs: 2.6 10*3/uL (ref 1.7–7.7)
Neutrophils Relative %: 54 %
Platelets: 209 10*3/uL (ref 150–400)
RBC: 4.04 MIL/uL (ref 3.87–5.11)
RDW: 12.8 % (ref 11.5–15.5)
WBC: 4.8 10*3/uL (ref 4.0–10.5)
nRBC: 0 % (ref 0.0–0.2)

## 2021-08-20 LAB — IRON AND IRON BINDING CAPACITY (CC-WL,HP ONLY)
Iron: 57 ug/dL (ref 28–170)
Saturation Ratios: 21 % (ref 10.4–31.8)
TIBC: 275 ug/dL (ref 250–450)
UIBC: 218 ug/dL

## 2021-08-20 LAB — VITAMIN B12: Vitamin B-12: 184 pg/mL (ref 180–914)

## 2021-08-20 LAB — FERRITIN: Ferritin: 68 ng/mL (ref 11–307)

## 2021-08-20 NOTE — Assessment & Plan Note (Signed)
This is a 31 yr old with IDA s.p Iron infusion referred to hematology for additional recommendations. She is feeling poorly and wondering if she needs iron infusion again, NO PE findings today concerning for severe IDA We reviewed labs, mild anemia with ferritin of 68, no indication for iron infusion. I dont think I can explain her symptoms with the anemia. Her fatigue and other symptoms could be from her autoimmune condition, medications or hyperglycemia. We will recommend she touch base with her PCP for hyperglycemia. She will RTC in 4 months.

## 2021-08-20 NOTE — Progress Notes (Signed)
[facility] CONSULT NOTE  Patient Care Team: Jonathon Jordan, MD as PCP - General (Family Medicine)  CHIEF COMPLAINTS/PURPOSE OF CONSULTATION:  Anemia, unspecified, follow-up  ASSESSMENT & PLAN:   This is a very pleasant 31 year old female patient with juvenile diabetes, rheumatoid arthritis on Plaquenil, hypertension, obesity status post gastric sleeve surgery status post IV Venofer last dose in September 2022 here for follow-up.  IDA (iron deficiency anemia) This is a 31 yr old with IDA s.p Iron infusion referred to hematology for additional recommendations. She is feeling poorly and wondering if she needs iron infusion again, NO PE findings today concerning for severe IDA We reviewed labs, mild anemia with ferritin of 68, no indication for iron infusion. I dont think I can explain her symptoms with the anemia. Her fatigue and other symptoms could be from her autoimmune condition, medications or hyperglycemia. We will recommend she touch base with her PCP for hyperglycemia. She will RTC in 4 months.  Thank you for consulting [name] the care of this patient.  Please not hesitate to contact us with any additional questions or concerns  HISTORY OF PRESENTING ILLNESS:   [name] 31 y.o. female is here because of anemia.  This is a very pleasant 31 year old female patient with past medical history significant for juvenile diabetes, rheumatoid arthritis on Plaquenil, gastric sleeve surgery referred to hematology for evaluation of anemia.  During her last visit, we have agreed to proceeding with intravenous iron infusion.  She completed her Venofer on 12/02/2020 and is here for follow-up   She is feeling very tired. She says she continues to crave ice. She had a very heavy menstrual cycle, tried birth control for menorrhagia, this didn't help. No other bleeding Rest of the pertinent 10 point ROS reviewed and negative.  MEDICAL HISTORY:  Past Medical History:   Diagnosis Date   Anemia    Arthritis    Asthma    Diabetes mellitus without complication ([place])    Hypertension    Vitamin D deficiency     SURGICAL HISTORY: Past Surgical History:  Procedure Laterality Date   CATARACT EXTRACTION, BILATERAL Bilateral 2022   GASTRIC BYPASS  07/2017    SOCIAL HISTORY: Social History   Socioeconomic History   Marital status: Single    Spouse name: Not on file   Number of children: Not on file   Years of education: Not on file   Highest education level: Not on file  Occupational History   Not on file  Tobacco Use   Smoking status: Never   Smokeless tobacco: Never  Vaping Use   Vaping Use: Never used  Substance and Sexual Activity   Alcohol use: Yes    Comment: occ   Drug use: Never   Sexual activity: Yes    Birth control/protection: None  Other Topics Concern   Not on file  Social History Narrative   Not on file   Social Determinants of Health   Financial Resource Strain: Not on file  Food Insecurity: Not on file  Transportation Needs: Not on file  Physical Activity: Not on file  Stress: Not on file  Social Connections: Not on file  Intimate Partner Violence: Not on file    FAMILY HISTORY: Family History  Problem Relation Age of Onset   Lupus Maternal Grandmother    Asthma Sister    ADD / ADHD Sister     ALLERGIES:  is allergic to fruit extracts, latex, and zinc.  MEDICATIONS:  Current Outpatient Medications  Medication Sig Dispense Refill   albuterol (PROVENTIL HFA;VENTOLIN HFA) 108 (90 Base) MCG/ACT inhaler Inhale 2 puffs into the lungs every 4 (four) hours as needed for wheezing.     baclofen (LIORESAL) 10 MG tablet Take 10 mg by mouth 3 (three) times daily as needed for muscle spasms.     ergocalciferol (VITAMIN D2) 1.25 MG (50000 UT) capsule Take 50,000 Units by mouth once a week. Wednesday     HUMALOG 100 UNIT/ML injection Inject 100 Units as directed See admin instructions. Inject up to 100 units per day via  insulin pump as directed  9   hydroxychloroquine (PLAQUENIL) 200 MG tablet TAKE 1 TABLET(200 MG) BY MOUTH TWICE DAILY 180 tablet 0   Lactobacillus (PROBIOTIC ACIDOPHILUS) TABS Take 1 tablet by mouth daily.     ondansetron (ZOFRAN ODT) 4 MG disintegrating tablet Take 1 tablet (4 mg total) by mouth every 8 (eight) hours as needed for up to 15 doses for nausea or vomiting. 15 tablet 0   prazosin (MINIPRESS) 2 MG capsule Take 2 mg by mouth at bedtime.     traZODone (DESYREL) 50 MG tablet Take 100 mg by mouth at bedtime as needed for sleep.     triamcinolone cream (KENALOG) 0.1 % Apply 1 application topically as needed (dry, itch skin). Apply 1-2 times a day to affected areas of dry itchy skin on the body until clear, then as needed. Not to face.     valsartan (DIOVAN) 160 MG tablet Take 160 mg by mouth daily.     No current facility-administered medications for this visit.     PHYSICAL EXAMINATION:  ECOG PERFORMANCE STATUS: 0 - Asymptomatic  Vitals:   08/20/21 1250  BP: (!) 147/87  Pulse: 99  Resp: 18  Temp: 98.8 F (37.1 C)  SpO2: 100%     Filed Weights   08/20/21 1250  Weight: 259 lb 11.2 oz (117.8 kg)   Physical Exam Constitutional:      Appearance: Normal appearance.  HENT:     Head: Normocephalic and atraumatic.  Cardiovascular:     Rate and Rhythm: Normal rate and regular rhythm.     Pulses: Normal pulses.     Heart sounds: Normal heart sounds.  Pulmonary:     Effort: Pulmonary effort is normal.     Breath sounds: Normal breath sounds.  Musculoskeletal:        General: No swelling or tenderness.     Cervical back: Normal range of motion and neck supple. No rigidity.  Lymphadenopathy:     Cervical: No cervical adenopathy.  Skin:    General: Skin is warm.  Neurological:     General: No focal deficit present.     Mental Status: She is alert.  Psychiatric:        Mood and Affect: Mood normal.     LABORATORY DATA:  I have reviewed the data as listed Lab  Results  Component Value Date   WBC 4.8 08/20/2021   HGB 10.8 (L) 08/20/2021   HCT 32.9 (L) 08/20/2021   MCV 81.4 08/20/2021   PLT 209 08/20/2021     Chemistry      Component Value Date/Time   NA 136 08/20/2021 1315   K 4.2 08/20/2021 1315   CL 105 08/20/2021 1315   CO2 25 08/20/2021 1315   BUN 11 08/20/2021 1315   CREATININE 0.84 08/20/2021 1315   CREATININE 0.76 02/25/2021 0916      Component Value Date/Time   CALCIUM 8.6 (L) 08/20/2021  1315   ALKPHOS 63 08/20/2021 1315   AST 14 (L) 08/20/2021 1315   ALT 11 08/20/2021 1315   BILITOT 0.6 08/20/2021 1315       RADIOGRAPHIC STUDIES: I have personally reviewed the radiological images as listed and agreed with the findings in the report. No results found.  All questions were answered. The patient knows to call the clinic with any problems, questions or concerns.      Benay Pike, MD 08/20/2021 10:56 PM

## 2021-10-13 NOTE — Progress Notes (Deleted)
Office Visit Note  Patient: [name]             Date of Birth: [birth year]           MRN: [id number]             PCP: [name], MD Referring: [name], MD Visit Date: 10/27/2021 Occupation: @GUAROCC @  Subjective:    History of Present Illness: [name] is a 31 y.o. female with history of seronegative rheumatoid arthritis. She is taking plaquenil 200 mg 1 tablet by mouth twice daily.    PLQ eye exam: 08/31/2021 WNL @ [facility]. Follow up in 1 year. CBC and CMP updated on 08/20/21.   Activities of Daily Living:  Patient reports morning stiffness for *** {minute/hour:19697}.   Patient {ACTIONS;DENIES/REPORTS:21021675::"Denies"} nocturnal pain.  Difficulty dressing/grooming: {ACTIONS;DENIES/REPORTS:21021675::"Denies"} Difficulty climbing stairs: {ACTIONS;DENIES/REPORTS:21021675::"Denies"} Difficulty getting out of chair: {ACTIONS;DENIES/REPORTS:21021675::"Denies"} Difficulty using hands for taps, buttons, cutlery, and/or writing: {ACTIONS;DENIES/REPORTS:21021675::"Denies"}  No Rheumatology ROS completed.   PMFS History:  Patient Active Problem List   Diagnosis Date Noted   IDA (iron deficiency anemia) 11/06/2020   Intractable abdominal pain 07/26/2020   Right upper quadrant abdominal pain 07/26/2020   Acute pyelonephritis 07/26/2020   Diabetes mellitus type 1, controlled, with complications (HCC) 07/26/2020   Obesity, Class III, BMI 40-49.9 (morbid obesity) (HCC) 07/26/2020   Enterococcus faecalis infection 07/26/2020   Essential hypertension 07/26/2020   RUQ abdominal pain 07/26/2020   Chronic low back pain 11/22/2013   Clinical depression 10/07/2013   Extreme obesity 10/07/2013   Diabetic neuropathy (HCC) 08/06/2013   General patient noncompliance 12/21/2012   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HCC) 12/30/2010   Appetite disorder 12/30/2010   BP (high blood pressure) 12/30/2010   Muscle ache 12/30/2010   Cachectic (HCC) 12/30/2010    Patellofemoral disorder of left knee 09/03/2010   Diabetes mellitus 09/03/2010    Past Medical History:  Diagnosis Date   Anemia    Arthritis    Asthma    Diabetes mellitus without complication (HCC)    Hypertension    Vitamin D deficiency     Family History  Problem Relation Age of Onset   Lupus Maternal Grandmother    Asthma Sister    ADD / ADHD Sister    Past Surgical History:  Procedure Laterality Date   CATARACT EXTRACTION, BILATERAL Bilateral 2022   GASTRIC BYPASS  07/2017   Social History   Social History Narrative   Not on file   Immunization History  Administered Date(s) Administered   Influenza Inj Mdck Quad Pf 02/11/2018   PFIZER(Purple Top)SARS-COV-2 Vaccination 05/28/2019, 06/18/2019, 03/06/2020, 09/13/2020   Pneumococcal Polysaccharide-23 10/07/2013     Objective: Vital Signs: There were no vitals taken for this visit.   Physical Exam Vitals and nursing note reviewed.  Constitutional:      Appearance: She is well-developed.  HENT:     Head: Normocephalic and atraumatic.  Eyes:     Conjunctiva/sclera: Conjunctivae normal.  Cardiovascular:     Rate and Rhythm: Normal rate and regular rhythm.     Heart sounds: Normal heart sounds.  Pulmonary:     Effort: Pulmonary effort is normal.     Breath sounds: Normal breath sounds.  Abdominal:     General: Bowel sounds are normal.     Palpations: Abdomen is soft.  Musculoskeletal:     Cervical back: Normal range of motion.  Lymphadenopathy:     Cervical: No cervical adenopathy.  Skin:    General: Skin is warm and  dry.     Capillary Refill: Capillary refill takes less than 2 seconds.  Neurological:     Mental Status: She is alert and oriented to person, place, and time.  Psychiatric:        Behavior: Behavior normal.      Musculoskeletal Exam: ***  CDAI Exam: CDAI Score: -- Patient Global: --; Provider Global: -- Swollen: --; Tender: -- Joint Exam 10/27/2021   No joint exam has been  documented for this visit   There is currently no information documented on the homunculus. Go to the Rheumatology activity and complete the homunculus joint exam.  Investigation: No additional findings.  Imaging: No results found.  Recent Labs: Lab Results  Component Value Date   WBC 4.8 08/20/2021   HGB 10.8 (L) 08/20/2021   PLT 209 08/20/2021   NA 136 08/20/2021   K 4.2 08/20/2021   CL 105 08/20/2021   CO2 25 08/20/2021   GLUCOSE 400 (H) 08/20/2021   BUN 11 08/20/2021   CREATININE 0.84 08/20/2021   BILITOT 0.6 08/20/2021   ALKPHOS 63 08/20/2021   AST 14 (L) 08/20/2021   ALT 11 08/20/2021   PROT 7.0 08/20/2021   ALBUMIN 3.3 (L) 08/20/2021   CALCIUM 8.6 (L) 08/20/2021   GFRAA 139 04/17/2020    Speciality Comments: PLQ eye exam: 08/31/2021 WNL @ [facility]. Follow up in 1 year.  Procedures:  No procedures performed Allergies: Fruit extracts, Latex, and Zinc   Assessment / Plan:     Visit Diagnoses: No diagnosis found.  Orders: No orders of the defined types were placed in this encounter.  No orders of the defined types were placed in this encounter.   Face-to-face time spent with patient was *** minutes. Greater than 50% of time was spent in counseling and coordination of care.  Follow-Up Instructions: No follow-ups on file.   Ellen Henri, CMA  Note - This record has been created using Animal nutritionist.  Chart creation errors have been sought, but may not always  have been located. Such creation errors do not reflect on  the standard of medical care.

## 2021-10-27 ENCOUNTER — Ambulatory Visit: Payer: Medicaid Other | Admitting: Physician Assistant

## 2021-10-27 DIAGNOSIS — M62838 Other muscle spasm: Secondary | ICD-10-CM

## 2021-10-27 DIAGNOSIS — R768 Other specified abnormal immunological findings in serum: Secondary | ICD-10-CM

## 2021-10-27 DIAGNOSIS — E1042 Type 1 diabetes mellitus with diabetic polyneuropathy: Secondary | ICD-10-CM

## 2021-10-27 DIAGNOSIS — M4306 Spondylolysis, lumbar region: Secondary | ICD-10-CM

## 2021-10-27 DIAGNOSIS — M0600 Rheumatoid arthritis without rheumatoid factor, unspecified site: Secondary | ICD-10-CM

## 2021-10-27 DIAGNOSIS — G5601 Carpal tunnel syndrome, right upper limb: Secondary | ICD-10-CM

## 2021-10-27 DIAGNOSIS — Z79899 Other long term (current) drug therapy: Secondary | ICD-10-CM

## 2021-10-27 DIAGNOSIS — R5383 Other fatigue: Secondary | ICD-10-CM

## 2021-10-27 DIAGNOSIS — M7918 Myalgia, other site: Secondary | ICD-10-CM

## 2021-10-27 DIAGNOSIS — Z8659 Personal history of other mental and behavioral disorders: Secondary | ICD-10-CM

## 2021-10-27 DIAGNOSIS — M79641 Pain in right hand: Secondary | ICD-10-CM

## 2021-10-27 DIAGNOSIS — E538 Deficiency of other specified B group vitamins: Secondary | ICD-10-CM

## 2021-10-27 DIAGNOSIS — Z862 Personal history of diseases of the blood and blood-forming organs and certain disorders involving the immune mechanism: Secondary | ICD-10-CM

## 2021-10-27 DIAGNOSIS — Z8269 Family history of other diseases of the musculoskeletal system and connective tissue: Secondary | ICD-10-CM

## 2021-10-27 DIAGNOSIS — Z8709 Personal history of other diseases of the respiratory system: Secondary | ICD-10-CM

## 2021-10-27 DIAGNOSIS — G894 Chronic pain syndrome: Secondary | ICD-10-CM

## 2021-10-27 DIAGNOSIS — G8929 Other chronic pain: Secondary | ICD-10-CM

## 2021-10-27 DIAGNOSIS — E559 Vitamin D deficiency, unspecified: Secondary | ICD-10-CM

## 2021-10-27 DIAGNOSIS — M222X2 Patellofemoral disorders, left knee: Secondary | ICD-10-CM

## 2021-10-27 DIAGNOSIS — Z8719 Personal history of other diseases of the digestive system: Secondary | ICD-10-CM

## 2021-10-27 DIAGNOSIS — E1143 Type 2 diabetes mellitus with diabetic autonomic (poly)neuropathy: Secondary | ICD-10-CM

## 2021-11-01 ENCOUNTER — Ambulatory Visit: Payer: Medicaid Other | Attending: Physician Assistant | Admitting: Physician Assistant

## 2021-11-01 ENCOUNTER — Encounter: Payer: Self-pay | Admitting: Physician Assistant

## 2021-11-01 VITALS — BP 158/103 | HR 83 | Ht 64.75 in | Wt 268.4 lb

## 2021-11-01 DIAGNOSIS — Z8709 Personal history of other diseases of the respiratory system: Secondary | ICD-10-CM

## 2021-11-01 DIAGNOSIS — M79642 Pain in left hand: Secondary | ICD-10-CM

## 2021-11-01 DIAGNOSIS — G894 Chronic pain syndrome: Secondary | ICD-10-CM

## 2021-11-01 DIAGNOSIS — M0600 Rheumatoid arthritis without rheumatoid factor, unspecified site: Secondary | ICD-10-CM | POA: Diagnosis not present

## 2021-11-01 DIAGNOSIS — M25561 Pain in right knee: Secondary | ICD-10-CM

## 2021-11-01 DIAGNOSIS — Z862 Personal history of diseases of the blood and blood-forming organs and certain disorders involving the immune mechanism: Secondary | ICD-10-CM

## 2021-11-01 DIAGNOSIS — E538 Deficiency of other specified B group vitamins: Secondary | ICD-10-CM

## 2021-11-01 DIAGNOSIS — Z8659 Personal history of other mental and behavioral disorders: Secondary | ICD-10-CM

## 2021-11-01 DIAGNOSIS — E559 Vitamin D deficiency, unspecified: Secondary | ICD-10-CM

## 2021-11-01 DIAGNOSIS — R768 Other specified abnormal immunological findings in serum: Secondary | ICD-10-CM

## 2021-11-01 DIAGNOSIS — G5601 Carpal tunnel syndrome, right upper limb: Secondary | ICD-10-CM | POA: Diagnosis not present

## 2021-11-01 DIAGNOSIS — R5383 Other fatigue: Secondary | ICD-10-CM

## 2021-11-01 DIAGNOSIS — M222X2 Patellofemoral disorders, left knee: Secondary | ICD-10-CM

## 2021-11-01 DIAGNOSIS — M533 Sacrococcygeal disorders, not elsewhere classified: Secondary | ICD-10-CM

## 2021-11-01 DIAGNOSIS — Z79899 Other long term (current) drug therapy: Secondary | ICD-10-CM | POA: Diagnosis not present

## 2021-11-01 DIAGNOSIS — G8929 Other chronic pain: Secondary | ICD-10-CM

## 2021-11-01 DIAGNOSIS — M79641 Pain in right hand: Secondary | ICD-10-CM | POA: Diagnosis not present

## 2021-11-01 DIAGNOSIS — M62838 Other muscle spasm: Secondary | ICD-10-CM

## 2021-11-01 DIAGNOSIS — Z8719 Personal history of other diseases of the digestive system: Secondary | ICD-10-CM

## 2021-11-01 DIAGNOSIS — E1143 Type 2 diabetes mellitus with diabetic autonomic (poly)neuropathy: Secondary | ICD-10-CM

## 2021-11-01 DIAGNOSIS — K3184 Gastroparesis: Secondary | ICD-10-CM

## 2021-11-01 DIAGNOSIS — M4306 Spondylolysis, lumbar region: Secondary | ICD-10-CM

## 2021-11-01 DIAGNOSIS — Z8269 Family history of other diseases of the musculoskeletal system and connective tissue: Secondary | ICD-10-CM

## 2021-11-01 DIAGNOSIS — M7918 Myalgia, other site: Secondary | ICD-10-CM

## 2021-11-01 DIAGNOSIS — E1042 Type 1 diabetes mellitus with diabetic polyneuropathy: Secondary | ICD-10-CM

## 2021-11-01 MED ORDER — HYDROXYCHLOROQUINE SULFATE 200 MG PO TABS: ORAL_TABLET | ORAL | 0 refills | Status: DC

## 2021-11-01 NOTE — Progress Notes (Signed)
Office Visit Note  Patient: [name]             Date of Birth: Aug 28, 1990           MRN: [id number]             PCP: Jonathon Jordan, MD Referring: Jonathon Jordan, MD Visit Date: 11/01/2021 Occupation: _0 @  Subjective:  Right shoulder joint pain   History of Present Illness: [name] is a 31 y.o. female with history of seronegative rheumatoid arthritis and myofascial pain.  Patient remains on Plaquenil 200 mg 1 tablet by mouth twice daily.  She is tolerating Plaquenil without any side effects and has not missed any doses recently.  She has been on Plaquenil consistently since February 2023.  She had ultrasound of both hands in March 2023 which was negative for synovitis.  She states that since consistently taking Plaquenil her joint pain and stiffness has improved.  She has been having increased pain in the right shoulder joint intermittently for the past 1-1/2 weeks.  She denies any injury or fall prior to the onset of symptoms.  She had difficulty using her right arm during these episodes.  She has also had pain at night intermittently.  She has taken baclofen as well as tried taking Tylenol which has alleviated her symptoms temporarily.  Of note she is scheduled for a laparoscopic cholecystectomy on 11/11/2021.  It is unclear if the left shoulder and scapular pain is referred pain from her gallbladder or due to underlying arthritis.  Patient reports that her blood sugars are not currently well controlled but she has been trying to better medically manage her diabetes.  She has been working with a Social worker which has been helpful as well.      Activities of Daily Living:  Patient reports morning stiffness for 2-3 hours.   Patient Reports nocturnal pain.  Difficulty dressing/grooming: Reports Difficulty climbing stairs: Reports Difficulty getting out of chair: Denies Difficulty using hands for taps, buttons, cutlery, and/or writing: Reports  Review of Systems   Constitutional:  Positive for fatigue.  HENT:  Negative for mouth sores and mouth dryness.   Eyes:  Positive for dryness.  Respiratory:  Negative for shortness of breath.   Cardiovascular:  Positive for irregular heartbeat. Negative for chest pain and palpitations.  Gastrointestinal:  Positive for constipation and nausea. Negative for blood in stool and diarrhea.  Endocrine: Negative for increased urination.  Genitourinary:  Negative for involuntary urination.  Musculoskeletal:  Positive for joint pain, joint pain, myalgias, muscle weakness, morning stiffness, muscle tenderness and myalgias. Negative for joint swelling.  Skin:  Positive for hair loss and sensitivity to sunlight. Negative for color change and rash.  Allergic/Immunologic: Negative for susceptible to infections.  Neurological:  Positive for dizziness and headaches.  Hematological:  Positive for swollen glands.  Psychiatric/Behavioral:  Positive for sleep disturbance. Negative for depressed mood. The patient is nervous/anxious.     PMFS History:  Patient Active Problem List   Diagnosis Date Noted   IDA (iron deficiency anemia) 11/06/2020   Intractable abdominal pain 07/26/2020   Right upper quadrant abdominal pain 07/26/2020   Acute pyelonephritis 07/26/2020   Diabetes mellitus type 1, controlled, with complications ([place]) 31/54/0086   Obesity, Class III, BMI 40-49.9 (morbid obesity) ([place]) 07/26/2020   Enterococcus faecalis infection 07/26/2020   Essential hypertension 07/26/2020   RUQ abdominal pain 07/26/2020   Chronic low back pain 11/22/2013   Clinical depression 10/07/2013   Extreme obesity 10/07/2013  Diabetic neuropathy ([place]) 08/06/2013   General patient noncompliance 12/21/2012   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[place]) 12/30/2010   Appetite disorder 12/30/2010   BP (high blood pressure) 12/30/2010   Muscle ache 12/30/2010   Cachectic ([place]) 12/30/2010   Patellofemoral disorder of left knee 09/03/2010   Diabetes mellitus  09/03/2010    Past Medical History:  Diagnosis Date   Anemia    Arthritis    Asthma    Diabetes mellitus without complication ([place])    Hypertension    Vitamin D deficiency     Family History  Problem Relation Age of Onset   Lupus Maternal Grandmother    Asthma Sister    ADD / ADHD Sister    Past Surgical History:  Procedure Laterality Date   CATARACT EXTRACTION, BILATERAL Bilateral 2022   GASTRIC BYPASS  07/2017   Social History   Social History Narrative   Not on file   Immunization History  Administered Date(s) Administered   Influenza Inj Mdck Quad Pf 02/11/2018   PFIZER(Purple Top)SARS-COV-2 Vaccination 05/28/2019, 06/18/2019, 03/06/2020, 09/13/2020   Pneumococcal Polysaccharide-23 10/07/2013     Objective: Vital Signs: BP (!) 158/103 (BP Location: Left Wrist, Patient Position: Sitting, Cuff Size: Normal)   Pulse 83   Ht 5' 4.75" (1.645 m)   Wt 268 lb 6.4 oz (121.7 kg)   BMI 45.01 kg/m    Physical Exam Vitals and nursing note reviewed.  Constitutional:      Appearance: She is well-developed.  HENT:     Head: Normocephalic and atraumatic.  Eyes:     Conjunctiva/sclera: Conjunctivae normal.  Cardiovascular:     Rate and Rhythm: Normal rate and regular rhythm.     Heart sounds: Normal heart sounds.  Pulmonary:     Effort: Pulmonary effort is normal.     Breath sounds: Normal breath sounds.  Abdominal:     General: Bowel sounds are normal.     Palpations: Abdomen is soft.  Musculoskeletal:     Cervical back: Normal range of motion.  Skin:    General: Skin is warm and dry.     Capillary Refill: Capillary refill takes less than 2 seconds.  Neurological:     Mental Status: She is alert and oriented to person, place, and time.  Psychiatric:        Behavior: Behavior normal.      Musculoskeletal Exam: C-spine has good ROM with some discomfort with lateral rotation.  Painful ROM of lumbar spine. Right shoulder has painful ROM with some diffuse  tenderness.  Left shoulder has good ROM with no discomfort.  Elbow joints, wrist joints, MCPs, PIPs, and DIPs good ROM with no synovitis.  Complete fist formation bilaterally.  Hip joints, knee joints, and ankle joints have good ROM with no discomfort.  No warmth or effusion of knee joints.  No tenderness or swelling of ankle joints.   CDAI Exam: CDAI Score: 1.6  Patient Global: 3 mm; Provider Global: 3 mm Swollen: 0 ; Tender: 1  Joint Exam 11/01/2021      Right  Left  Glenohumeral   Tender        Investigation: No additional findings.  Imaging: No results found.  Recent Labs: Lab Results  Component Value Date   WBC 4.8 08/20/2021   HGB 10.8 (L) 08/20/2021   PLT 209 08/20/2021   NA 136 08/20/2021   K 4.2 08/20/2021   CL 105 08/20/2021   CO2 25 08/20/2021   GLUCOSE 400 (H) 08/20/2021   BUN 11 08/20/2021  CREATININE 0.84 08/20/2021   BILITOT 0.6 08/20/2021   ALKPHOS 63 08/20/2021   AST 14 (L) 08/20/2021   ALT 11 08/20/2021   PROT 7.0 08/20/2021   ALBUMIN 3.3 (L) 08/20/2021   CALCIUM 8.6 (L) 08/20/2021   GFRAA 139 04/17/2020    Speciality Comments: PLQ eye exam: 08/31/2021 WNL @ [facility]. Follow up in 1 year.  Procedures:  No procedures performed Allergies: Fruit extracts, Latex, and Zinc   Assessment / Plan:     Visit Diagnoses: Seronegative rheumatoid arthritis ([place]) - U/S+ syonvitis Bilateral wrists/left MCP, RF-, CCP-, 14-3-3 eta negative: She has no synovitis on examination today. She has not had any recent rheumatoid arthritis flares.  She has been taking Plaquenil 200 mg 1 tablet by mouth twice daily consistently since February 2023.  She had ultrasound of both hands on 05/27/2021 which was negative for synovitis.  She presents today with increased discomfort in the right shoulder joint which started 1 and half weeks ago with no injury prior to the onset of symptoms.  Her symptoms have been intermittent but she has had nocturnal pain several nights.   On examination she has good range of motion with discomfort diffuse tenderness of the right shoulder extending down the right scapula.  Of note she is scheduled for laparoscopic cholecystectomy on 11/11/2021.  Discussed that cholecystitis often times causes referred pain to the right shoulder.  Discussed that this could be a cause for her intermittent discomfort.  We will hold off on further intervention at this time.  She would like to proceed with having laparoscopic surgery and if her symptoms persist or worsen in the right shoulder she will return for follow-up visit for further evaluation. She is not a good candidate for cortisone injections due to uncontrolled diabetes.  Discussed the importance of performing shoulder joint range of motion exercises.  She may benefit from physical therapy if her symptoms persist or worsen. She is not experiencing other joint pain or inflammation at this time.  Overall her rheumatoid arthritis appears better controlled since taking Plaquenil consistently.  No medication changes will be made at this time.  She was advised to notify us if she develops increased joint pain or joint swelling.  She will follow-up in the office in 3 months or sooner if needed. - Plan: hydroxychloroquine (PLAQUENIL) 200 MG tablet  High risk medication use - Plaquenil 200 mg 1 tablet by mouth twice daily. PLQ eye exam: 08/31/2021  - Plan: CBC with Differential/Platelet, COMPLETE METABOLIC PANEL WITH GFR PLQ eye exam: 08/31/2021 WNL @ [facility]. Follow up in 1 year. CBC and CMP updated on 08/20/21.  Patient requested to have updated lab work while she was in the office today.  Orders for CBC and CMP were released.  Pain in both hands: No joint tenderness or synovitis on examination today.  Discussed the importance of joint protection and muscle strengthening.  Carpal tunnel syndrome, right upper limb: Asymptomatic currently.  Chronic pain of right knee: No warmth or effusion  noted.  Patellofemoral disorder of left knee: She has good range of motion of the left knee joint on examination today.  No warmth effusion noted.  Chronic SI joint pain: She continues to experience chronic discomfort in her lower back.  She has no symptoms of radiculopathy at this time.  She had injections in her lower back performed by pain management in the past.  She has not needed an injection recently.  Spondylolysis, lumbar region - Followed by pain management. Chronic  pain.  Painful ROM.  She has had injections in the past.   Positive ANA (antinuclear antibody) -ANA positive 11/15/19. 06/17/2020 ANA negative, complements within normal limits, ESR within normal limits, double-stranded DNA negative, CK within normal limits.  Family history of systemic lupus.  No clinical features of systemic lupus at this time.  Patient requested to have the following lab work updated today.  Family history of systemic lupus erythematosus -History of positive ANA in 2021.  No clinical features of systemic lupus at this time.  Patient requested to have the following lab work rechecked today.  Plan: ANA, Sedimentation rate, C3 and C4, Anti-DNA antibody, double-stranded, Urinalysis, Routine w reflex microscopic  Trapezius muscle spasm: She has not currently experiencing any muscle spasms.  She has a prescription for baclofen which she takes as needed for relief.  Myofascial pain: She continues to experience intermittent myalgias and muscle tenderness due to myofascial pain syndrome.  She has some trapezius muscle tension and tenderness bilaterally especially on the right side today.  Discussed the importance of regular exercise and good sleep hygiene.  She takes baclofen as needed for muscle spasms.  Chronic pain syndrome - She continues to follow up with pain management as needed.    Other fatigue: Chronic and secondary to iron deficiency anemia. Improved after iron infusions.   Other medical conditions are  listed as follows:   Vitamin B12 deficiency  Vitamin D deficiency  History of iron deficiency anemia - Followed by Dr. Chryl Heck.    History of gastroesophageal reflux (GERD)  History of depression  History of asthma  Type 1 diabetes mellitus with diabetic polyneuropathy (HCC)  Diabetic gastroparesis ([place])     Orders: Orders Placed This Encounter  Procedures   ANA   Sedimentation rate   C3 and C4   Anti-DNA antibody, double-stranded   CBC with Differential/Platelet   COMPLETE METABOLIC PANEL WITH GFR   Urinalysis, Routine w reflex microscopic   Meds ordered this encounter  Medications   hydroxychloroquine (PLAQUENIL) 200 MG tablet    Sig: TAKE 1 TABLET(200 MG) BY MOUTH TWICE DAILY    Dispense:  180 tablet    Refill:  0    Follow-Up Instructions: Return in about 3 months (around 02/01/2022) for Rheumatoid arthritis.   Ofilia Neas, PA-C  Note - This record has been created using Dragon software.  Chart creation errors have been sought, but may not always  have been located. Such creation errors do not reflect on  the standard of medical care.

## 2021-11-03 NOTE — Progress Notes (Signed)
ANA negative. Complements WNL.   dsDNA negative.  2+ protein in urine.  Please add protein creatinine ratio.   ESR remains borderline elevated but has trended down.  Hgb and hct are borderline low.  Plt count is low-131.  CMP WNL.

## 2021-11-05 ENCOUNTER — Telehealth: Payer: Self-pay | Admitting: *Deleted

## 2021-11-05 DIAGNOSIS — M0600 Rheumatoid arthritis without rheumatoid factor, unspecified site: Secondary | ICD-10-CM

## 2021-11-05 DIAGNOSIS — R809 Proteinuria, unspecified: Secondary | ICD-10-CM

## 2021-11-05 LAB — URINALYSIS, ROUTINE W REFLEX MICROSCOPIC
Bacteria, UA: NONE SEEN /HPF
Bilirubin Urine: NEGATIVE
Glucose, UA: NEGATIVE
Hgb urine dipstick: NEGATIVE
Hyaline Cast: NONE SEEN /LPF
Ketones, ur: NEGATIVE
Leukocytes,Ua: NEGATIVE
Nitrite: NEGATIVE
RBC / HPF: NONE SEEN /HPF (ref 0–2)
Specific Gravity, Urine: 1.029 (ref 1.001–1.035)
Squamous Epithelial / HPF: NONE SEEN /HPF (ref <=5)
WBC, UA: NONE SEEN /HPF (ref 0–5)
pH: 6 (ref 5.0–8.0)

## 2021-11-05 LAB — COMPLETE METABOLIC PANEL WITH GFR
AG Ratio: 1.3 (calc) (ref 1.0–2.5)
ALT: 11 U/L (ref 6–29)
AST: 10 U/L (ref 10–30)
Albumin: 3.8 g/dL (ref 3.6–5.1)
Alkaline phosphatase (APISO): 51 U/L (ref 31–125)
BUN: 13 mg/dL (ref 7–25)
CO2: 24 mmol/L (ref 20–32)
Calcium: 8.7 mg/dL (ref 8.6–10.2)
Chloride: 108 mmol/L (ref 98–110)
Creat: 0.71 mg/dL (ref 0.50–0.97)
Globulin: 3 g/dL (calc) (ref 1.9–3.7)
Glucose, Bld: 55 mg/dL — ABNORMAL LOW (ref 65–99)
Potassium: 4.4 mmol/L (ref 3.5–5.3)
Sodium: 140 mmol/L (ref 135–146)
Total Bilirubin: 0.5 mg/dL (ref 0.2–1.2)
Total Protein: 6.8 g/dL (ref 6.1–8.1)
eGFR: 117 mL/min/{1.73_m2} (ref >=60)

## 2021-11-05 LAB — C3 AND C4
C3 Complement: 138 mg/dL (ref 83–193)
C4 Complement: 34 mg/dL (ref 15–57)

## 2021-11-05 LAB — TEST AUTHORIZATION

## 2021-11-05 LAB — CBC WITH DIFFERENTIAL/PLATELET
Absolute Monocytes: 514 cells/uL (ref 200–950)
Basophils Absolute: 52 cells/uL (ref 0–200)
Basophils Relative: 0.8 %
Eosinophils Absolute: 59 cells/uL (ref 15–500)
Eosinophils Relative: 0.9 %
HCT: 34 % — ABNORMAL LOW (ref 35.0–45.0)
Hemoglobin: 10.7 g/dL — ABNORMAL LOW (ref 11.7–15.5)
Lymphs Abs: 2756 cells/uL (ref 850–3900)
MCH: 26.5 pg — ABNORMAL LOW (ref 27.0–33.0)
MCHC: 31.5 g/dL — ABNORMAL LOW (ref 32.0–36.0)
MCV: 84.2 fL (ref 80.0–100.0)
MPV: 13.1 fL — ABNORMAL HIGH (ref 7.5–12.5)
Monocytes Relative: 7.9 %
Neutro Abs: 3120 cells/uL (ref 1500–7800)
Neutrophils Relative %: 48 %
Platelets: 131 10*3/uL — ABNORMAL LOW (ref 140–400)
RBC: 4.04 10*6/uL (ref 3.80–5.10)
RDW: 12.6 % (ref 11.0–15.0)
Total Lymphocyte: 42.4 %
WBC: 6.5 10*3/uL (ref 3.8–10.8)

## 2021-11-05 LAB — PROTEIN / CREATININE RATIO, URINE
Creatinine, Urine: 162 mg/dL (ref 20–275)
Protein/Creat Ratio: 1000 mg/g creat — ABNORMAL HIGH (ref 24–184)
Protein/Creatinine Ratio: 1 mg/mg creat — ABNORMAL HIGH (ref 0.024–0.184)
Total Protein, Urine: 162 mg/dL — ABNORMAL HIGH (ref 5–24)

## 2021-11-05 LAB — ANA: Anti Nuclear Antibody (ANA): NEGATIVE

## 2021-11-05 LAB — MICROSCOPIC MESSAGE

## 2021-11-05 LAB — SEDIMENTATION RATE: Sed Rate: 29 mm/h — ABNORMAL HIGH (ref 0–20)

## 2021-11-05 LAB — ANTI-DNA ANTIBODY, DOUBLE-STRANDED: ds DNA Ab: <1 IU/mL

## 2021-11-05 NOTE — Telephone Encounter (Signed)
-----   Message from Gearldine Bienenstock, PA-C sent at 11/05/2021  8:39 AM EDT ----- Protein creatinine ratio is elevated.  Please refer to nephrology for further evaluation.

## 2021-11-05 NOTE — Progress Notes (Signed)
Protein creatinine ratio is elevated.  Please refer to nephrology for further evaluation.

## 2021-11-10 ENCOUNTER — Encounter (HOSPITAL_COMMUNITY): Payer: Self-pay | Admitting: General Surgery

## 2021-11-10 ENCOUNTER — Other Ambulatory Visit: Payer: Self-pay

## 2021-11-10 NOTE — Progress Notes (Signed)
PCP - Dr. [name] Cardiologist - n/a Rheumatology - Dr Pollyann Savoy  Chest x-ray - n/a EKG - DOS Stress Test - n/a ECHO - n/a Cardiac Cath - n/a  ICD Pacemaker/Loop - n/a  Sleep Study -  n/a CPAP - none  Diabetes type 1- Insulin pump ok to leave on during surgery per Boneta Lucks (DM Coordinator).  Surgery posted for 1 hour.  Check CBG every 2 hrs prior to arrival on DOS.  If your blood sugar is less than 70 mg/dL, you will need to treat for low blood sugar: Treat a low blood sugar (less than 70 mg/dL) with  cup of clear juice (cranberry or apple), 4 glucose tablets, OR glucose gel. Recheck blood sugar in 15 minutes after treatment (to make sure it is greater than 70 mg/dL). If your blood sugar is not greater than 70 mg/dL on recheck, call [phone] for further instructions.  Anesthesia review: Yes  STOP now taking any Aspirin (unless otherwise instructed by your surgeon), Aleve, Naproxen, Ibuprofen, Motrin, Advil, Goody's, BC's, all herbal medications, fish oil, and all vitamins.   Coronavirus Screening Do you have any of the following symptoms:  Cough yes/no: No Fever (>100.10F)  yes/no: No Runny nose yes/no: No Sore throat yes/no: No Difficulty breathing/shortness of breath  yes/no: No  Have you traveled in the last 14 days and where? yes/no: No  Patient verbalized understanding of instructions that were given via phone.

## 2021-11-11 ENCOUNTER — Ambulatory Visit (HOSPITAL_BASED_OUTPATIENT_CLINIC_OR_DEPARTMENT_OTHER): Payer: No Typology Code available for payment source | Admitting: Physician Assistant

## 2021-11-11 ENCOUNTER — Encounter (HOSPITAL_COMMUNITY)
Admission: RE | Disposition: A | Discharge status: Home / Self Care | Payer: Self-pay | Source: Home / Self Care | Attending: General Surgery

## 2021-11-11 ENCOUNTER — Other Ambulatory Visit: Payer: Self-pay

## 2021-11-11 ENCOUNTER — Encounter (HOSPITAL_COMMUNITY): Payer: Self-pay | Admitting: General Surgery

## 2021-11-11 ENCOUNTER — Encounter: Payer: Self-pay | Admitting: Hematology and Oncology

## 2021-11-11 ENCOUNTER — Ambulatory Visit (HOSPITAL_COMMUNITY)
Admission: RE | Admit: 2021-11-11 | Discharge: 2021-11-11 | Disposition: A | Discharge status: Home / Self Care | Payer: No Typology Code available for payment source | Attending: General Surgery | Admitting: General Surgery

## 2021-11-11 ENCOUNTER — Ambulatory Visit (HOSPITAL_COMMUNITY): Payer: No Typology Code available for payment source | Admitting: Physician Assistant

## 2021-11-11 DIAGNOSIS — E119 Type 2 diabetes mellitus without complications: Secondary | ICD-10-CM | POA: Insufficient documentation

## 2021-11-11 DIAGNOSIS — I10 Essential (primary) hypertension: Secondary | ICD-10-CM | POA: Insufficient documentation

## 2021-11-11 DIAGNOSIS — Z794 Long term (current) use of insulin: Secondary | ICD-10-CM | POA: Diagnosis not present

## 2021-11-11 DIAGNOSIS — F418 Other specified anxiety disorders: Secondary | ICD-10-CM | POA: Diagnosis not present

## 2021-11-11 DIAGNOSIS — K828 Other specified diseases of gallbladder: Secondary | ICD-10-CM

## 2021-11-11 DIAGNOSIS — K811 Chronic cholecystitis: Secondary | ICD-10-CM | POA: Insufficient documentation

## 2021-11-11 DIAGNOSIS — J45909 Unspecified asthma, uncomplicated: Secondary | ICD-10-CM

## 2021-11-11 DIAGNOSIS — K219 Gastro-esophageal reflux disease without esophagitis: Secondary | ICD-10-CM | POA: Diagnosis not present

## 2021-11-11 DIAGNOSIS — E669 Obesity, unspecified: Secondary | ICD-10-CM | POA: Insufficient documentation

## 2021-11-11 DIAGNOSIS — Z6841 Body Mass Index (BMI) 40.0 and over, adult: Secondary | ICD-10-CM | POA: Insufficient documentation

## 2021-11-11 HISTORY — DX: Headache, unspecified: R51.9

## 2021-11-11 HISTORY — DX: Hyperlipidemia, unspecified: E78.5

## 2021-11-11 HISTORY — DX: Anxiety disorder, unspecified: F41.9

## 2021-11-11 HISTORY — DX: Cardiac arrhythmia, unspecified: I49.9

## 2021-11-11 HISTORY — DX: Gastro-esophageal reflux disease without esophagitis: K21.9

## 2021-11-11 HISTORY — PX: CHOLECYSTECTOMY: SHX55

## 2021-11-11 LAB — GLUCOSE, CAPILLARY
Glucose-Capillary: 354 mg/dL — ABNORMAL HIGH (ref 70–99)
Glucose-Capillary: 323 mg/dL — ABNORMAL HIGH (ref 70–99)
Glucose-Capillary: 227 mg/dL — ABNORMAL HIGH (ref 70–99)
Glucose-Capillary: 200 mg/dL — ABNORMAL HIGH (ref 70–99)
Glucose-Capillary: 202 mg/dL — ABNORMAL HIGH (ref 70–99)

## 2021-11-11 LAB — POCT PREGNANCY, URINE: Preg Test, Ur: NEGATIVE

## 2021-11-11 SURGERY — LAPAROSCOPIC CHOLECYSTECTOMY
Anesthesia: General | Site: Abdomen

## 2021-11-11 MED ORDER — ORAL CARE MOUTH RINSE: 15.0000 mL | Freq: Once | OROMUCOSAL | Status: AC

## 2021-11-11 MED ORDER — PROMETHAZINE HCL 25 MG/ML IJ SOLN
6.2500 mg | INTRAMUSCULAR | Status: DC | PRN
  Administered 2021-11-11: 6.25 mg via INTRAVENOUS

## 2021-11-11 MED ORDER — DEXTROSE 5 % IV SOLN
INTRAVENOUS | Status: DC | PRN
  Administered 2021-11-11: 3 g via INTRAVENOUS

## 2021-11-11 MED ORDER — FENTANYL CITRATE (PF) 100 MCG/2ML IJ SOLN
INTRAMUSCULAR | Status: AC
  Filled 2021-11-11: qty 2

## 2021-11-11 MED ORDER — ONDANSETRON HCL 4 MG/2ML IJ SOLN
INTRAMUSCULAR | Status: DC | PRN
  Administered 2021-11-11: 4 mg via INTRAVENOUS

## 2021-11-11 MED ORDER — HYDRALAZINE HCL 20 MG/ML IJ SOLN
5.0000 mg | Freq: Once | INTRAMUSCULAR | Status: AC
  Administered 2021-11-11: 5 mg via INTRAVENOUS

## 2021-11-11 MED ORDER — INSULIN ASPART 100 UNIT/ML IJ SOLN
0.0000 [IU] | INTRAMUSCULAR | Status: DC | PRN
  Administered 2021-11-11: 6 [IU] via SUBCUTANEOUS
  Filled 2021-11-11: qty 1

## 2021-11-11 MED ORDER — MIDAZOLAM HCL 2 MG/2ML IJ SOLN
INTRAMUSCULAR | Status: AC
  Filled 2021-11-11: qty 2

## 2021-11-11 MED ORDER — ACETAMINOPHEN 325 MG PO TABS: 325.0000 mg | ORAL_TABLET | ORAL | Status: DC | PRN

## 2021-11-11 MED ORDER — PROPOFOL 10 MG/ML IV BOLUS
INTRAVENOUS | Status: AC
  Filled 2021-11-11: qty 20

## 2021-11-11 MED ORDER — TRAMADOL HCL 50 MG PO TABS: 50.0000 mg | ORAL_TABLET | Freq: Four times a day (QID) | ORAL | 0 refills | Status: AC | PRN

## 2021-11-11 MED ORDER — ROCURONIUM BROMIDE 10 MG/ML (PF) SYRINGE
PREFILLED_SYRINGE | INTRAVENOUS | Status: AC
  Filled 2021-11-11: qty 10

## 2021-11-11 MED ORDER — LIDOCAINE 2% (20 MG/ML) 5 ML SYRINGE
INTRAMUSCULAR | Status: AC
  Filled 2021-11-11: qty 5

## 2021-11-11 MED ORDER — LIDOCAINE 2% (20 MG/ML) 5 ML SYRINGE
INTRAMUSCULAR | Status: DC | PRN
  Administered 2021-11-11: 40 mg via INTRAVENOUS

## 2021-11-11 MED ORDER — AMISULPRIDE (ANTIEMETIC) 5 MG/2ML IV SOLN
10.0000 mg | Freq: Once | INTRAVENOUS | Status: AC | PRN
  Administered 2021-11-11: 10 mg via INTRAVENOUS

## 2021-11-11 MED ORDER — CEFAZOLIN IN SODIUM CHLORIDE 3-0.9 GM/100ML-% IV SOLN
INTRAVENOUS | Status: AC
  Filled 2021-11-11: qty 100

## 2021-11-11 MED ORDER — DEXAMETHASONE SODIUM PHOSPHATE 10 MG/ML IJ SOLN
INTRAMUSCULAR | Status: DC | PRN
  Administered 2021-11-11: 4 mg via INTRAVENOUS

## 2021-11-11 MED ORDER — 0.9 % SODIUM CHLORIDE (POUR BTL) OPTIME
TOPICAL | Status: DC | PRN
  Administered 2021-11-11: 1000 mL

## 2021-11-11 MED ORDER — LACTATED RINGERS IV SOLN: INTRAVENOUS | Status: DC

## 2021-11-11 MED ORDER — OXYCODONE HCL 5 MG/5ML PO SOLN: 5.0000 mg | Freq: Once | ORAL | Status: DC | PRN

## 2021-11-11 MED ORDER — MIDAZOLAM HCL 5 MG/5ML IJ SOLN
INTRAMUSCULAR | Status: DC | PRN
  Administered 2021-11-11: 2 mg via INTRAVENOUS

## 2021-11-11 MED ORDER — BUPIVACAINE HCL 0.25 % IJ SOLN
INTRAMUSCULAR | Status: DC | PRN
  Administered 2021-11-11: 16 mL

## 2021-11-11 MED ORDER — PROMETHAZINE HCL 25 MG/ML IJ SOLN
INTRAMUSCULAR | Status: AC
  Filled 2021-11-11: qty 1

## 2021-11-11 MED ORDER — HYDRALAZINE HCL 20 MG/ML IJ SOLN: 5.0000 mg | Freq: Once | INTRAMUSCULAR | Status: DC

## 2021-11-11 MED ORDER — DEXAMETHASONE SODIUM PHOSPHATE 10 MG/ML IJ SOLN
INTRAMUSCULAR | Status: AC
Start: 2021-11-11 — End: ?
  Filled 2021-11-11: qty 1

## 2021-11-11 MED ORDER — ACETAMINOPHEN 10 MG/ML IV SOLN: 1000.0000 mg | Freq: Once | INTRAVENOUS | Status: DC | PRN

## 2021-11-11 MED ORDER — FENTANYL CITRATE (PF) 250 MCG/5ML IJ SOLN
INTRAMUSCULAR | Status: DC | PRN
Start: 2021-11-11 — End: 2021-11-11
  Administered 2021-11-11 (×2): 50 ug via INTRAVENOUS
  Administered 2021-11-11: 100 ug via INTRAVENOUS
  Administered 2021-11-11: 50 ug via INTRAVENOUS

## 2021-11-11 MED ORDER — ROCURONIUM BROMIDE 10 MG/ML (PF) SYRINGE
PREFILLED_SYRINGE | INTRAVENOUS | Status: DC | PRN
  Administered 2021-11-11: 60 mg via INTRAVENOUS

## 2021-11-11 MED ORDER — CHLORHEXIDINE GLUCONATE 0.12 % MT SOLN
15.0000 mL | Freq: Once | OROMUCOSAL | Status: AC
  Filled 2021-11-11: qty 15

## 2021-11-11 MED ORDER — ACETAMINOPHEN 160 MG/5ML PO SOLN: 325.0000 mg | ORAL | Status: DC | PRN

## 2021-11-11 MED ORDER — SUGAMMADEX SODIUM 200 MG/2ML IV SOLN
INTRAVENOUS | Status: DC | PRN
  Administered 2021-11-11: 200 mg via INTRAVENOUS

## 2021-11-11 MED ORDER — BUPIVACAINE HCL (PF) 0.25 % IJ SOLN
INTRAMUSCULAR | Status: AC
  Filled 2021-11-11: qty 30

## 2021-11-11 MED ORDER — CHLORHEXIDINE GLUCONATE 0.12 % MT SOLN
OROMUCOSAL | Status: AC
  Administered 2021-11-11: 15 mL via OROMUCOSAL
  Filled 2021-11-11: qty 15

## 2021-11-11 MED ORDER — OXYCODONE HCL 5 MG PO TABS: 5.0000 mg | ORAL_TABLET | Freq: Once | ORAL | Status: DC | PRN

## 2021-11-11 MED ORDER — PROPOFOL 10 MG/ML IV BOLUS
INTRAVENOUS | Status: DC | PRN
  Administered 2021-11-11: 140 mg via INTRAVENOUS
  Administered 2021-11-11 (×2): 30 mg via INTRAVENOUS

## 2021-11-11 MED ORDER — SODIUM CHLORIDE 0.9 % IR SOLN
Status: DC | PRN
  Administered 2021-11-11: 1000 mL

## 2021-11-11 MED ORDER — ONDANSETRON HCL 4 MG/2ML IJ SOLN
INTRAMUSCULAR | Status: AC
  Filled 2021-11-11: qty 2

## 2021-11-11 MED ORDER — FENTANYL CITRATE (PF) 250 MCG/5ML IJ SOLN
INTRAMUSCULAR | Status: AC
  Filled 2021-11-11: qty 5

## 2021-11-11 MED ORDER — HYDRALAZINE HCL 20 MG/ML IJ SOLN
INTRAMUSCULAR | Status: AC
  Filled 2021-11-11: qty 1

## 2021-11-11 MED ORDER — AMISULPRIDE (ANTIEMETIC) 5 MG/2ML IV SOLN
INTRAVENOUS | Status: AC
  Filled 2021-11-11: qty 4

## 2021-11-11 MED ORDER — FENTANYL CITRATE (PF) 100 MCG/2ML IJ SOLN
25.0000 ug | INTRAMUSCULAR | Status: DC | PRN
  Administered 2021-11-11 (×3): 50 ug via INTRAVENOUS

## 2021-11-11 SURGICAL SUPPLY — 41 items
BAG COUNTER SPONGE SURGICOUNT (BAG) ×1 IMPLANT
CANISTER SUCT 3000ML PPV (MISCELLANEOUS) ×1 IMPLANT
CHLORAPREP W/TINT 26 (MISCELLANEOUS) ×1 IMPLANT
CLIP LIGATING HEMO O LOK GREEN (MISCELLANEOUS) ×1 IMPLANT
COVER SURGICAL LIGHT HANDLE (MISCELLANEOUS) ×1 IMPLANT
COVER TRANSDUCER ULTRASND (DRAPES) ×1 IMPLANT
DERMABOND IMPLANT
DERMABOND ADVANCED (GAUZE/BANDAGES/DRESSINGS) ×1
DERMABOND ADVANCED .7 DNX12 (GAUZE/BANDAGES/DRESSINGS) ×1 IMPLANT
ELECT REM PT RETURN 9FT ADLT (ELECTROSURGICAL) ×1
ELECTRODE REM PT RTRN 9FT ADLT (ELECTROSURGICAL) ×1 IMPLANT
GLOVE BIO SURGEON STRL SZ7.5 (GLOVE) ×1 IMPLANT
GLOVE SURG SYN 7.5  E (GLOVE) ×1
GLOVE SURG SYN 7.5 E (GLOVE) ×1 IMPLANT
GLOVE SURG SYN 7.5 PF PI (GLOVE) ×1 IMPLANT
GOWN STRL REUS W/ TWL LRG LVL3 (GOWN DISPOSABLE) ×2 IMPLANT
GOWN STRL REUS W/ TWL XL LVL3 (GOWN DISPOSABLE) ×1 IMPLANT
GOWN STRL REUS W/TWL LRG LVL3 (GOWN DISPOSABLE) ×2
GOWN STRL REUS W/TWL XL LVL3 (GOWN DISPOSABLE) ×1
GRASPER SUT TROCAR 14GX15 (MISCELLANEOUS) ×1 IMPLANT
KIT BASIN OR (CUSTOM PROCEDURE TRAY) ×1 IMPLANT
KIT TURNOVER KIT B (KITS) ×1 IMPLANT
NDL INSUFFLATION 14GA 120MM (NEEDLE) ×1 IMPLANT
NEEDLE INSUFFLATION 14GA 120MM (NEEDLE) ×1 IMPLANT
NS IRRIG 1000ML POUR BTL (IV SOLUTION) ×1 IMPLANT
PAD ARMBOARD 7.5X6 YLW CONV (MISCELLANEOUS) ×1 IMPLANT
POUCH RETRIEVAL ECOSAC 10 (ENDOMECHANICALS) IMPLANT
POUCH RETRIEVAL ECOSAC 10MM (ENDOMECHANICALS) ×1
SCISSORS LAP 5X35 DISP (ENDOMECHANICALS) ×1 IMPLANT
SET IRRIG TUBING LAPAROSCOPIC (IRRIGATION / IRRIGATOR) ×1 IMPLANT
SET TUBE SMOKE EVAC HIGH FLOW (TUBING) ×1 IMPLANT
SLEEVE ENDOPATH XCEL 5M (ENDOMECHANICALS) ×1 IMPLANT
SPECIMEN JAR SMALL (MISCELLANEOUS) ×1 IMPLANT
SUT MNCRL AB 4-0 PS2 18 (SUTURE) ×1 IMPLANT
TOWEL GREEN STERILE (TOWEL DISPOSABLE) ×1 IMPLANT
TOWEL GREEN STERILE FF (TOWEL DISPOSABLE) ×1 IMPLANT
TRAY LAPAROSCOPIC MC (CUSTOM PROCEDURE TRAY) ×1 IMPLANT
TROCAR XCEL NON-BLD 11X100MML (ENDOMECHANICALS) ×1 IMPLANT
TROCAR Z-THREAD OPTICAL 5X100M (TROCAR) ×1 IMPLANT
WARMER LAPAROSCOPE (MISCELLANEOUS) ×1 IMPLANT
WATER STERILE IRR 1000ML POUR (IV SOLUTION) ×1 IMPLANT

## 2021-11-11 NOTE — H&P (Signed)
Chief Complaint: New Consultation       History of Present Illness: [name] is a 31 y.o. female who is seen today as an office consultation at the request of Dr. Tad Moore for evaluation of New Consultation .   Patient is a 31 year old female with a history of approximate 1 year of abdominal pain.  States is mainly right upper quadrant and does spread out to the epigastrium left upper quadrant.  Patient's had multitude of work-up to include HIDA scan, ultrasound labs.  Patient's HIDA scan was normal however she does have pain with Pedialyte.  Patient had ultrasound with a positive Murphy sign and seen to have no gallstones.   Patient states that her pain is usually associate with nausea vomiting.  She has been to the ER several times secondary to pain.   She had previous gastric sleeve surgery done laparoscopically in the past.           Review of Systems: A complete review of systems was obtained from the patient.  I have reviewed this information and discussed as appropriate with the patient.  See HPI as well for other ROS.   Review of Systems  Constitutional: Negative for fever.  HENT: Negative for congestion.   Eyes: Negative for blurred vision.  Respiratory: Negative for cough, shortness of breath and wheezing.   Cardiovascular: Negative for chest pain and palpitations.  Gastrointestinal: Positive for abdominal pain, nausea and vomiting. Negative for heartburn.  Genitourinary: Negative for dysuria.  Musculoskeletal: Negative for myalgias.  Skin: Negative for rash.  Neurological: Negative for dizziness and headaches.  Psychiatric/Behavioral: Negative for depression and suicidal ideas.  All other systems reviewed and are negative.       Medical History: Past Medical History Past Medical History: Diagnosis        Date            Anemia                         Anxiety                    There is no problem list on file for this patient.     Past Surgical  History Past Surgical History: Procedure       Laterality         Date            LAPAROSCOPIC SLEEVE GASTRECTOMY                                Allergies Allergies Allergen           Reactions            Zinc     Anaphylaxis, Itching and Other (See Comments)            Fruit Extracts   Itching                           All fruits some worse than others All fruits some worse than others              Latex   Itching, Other (See Comments) and Swelling            Other   Itching  Fruits       Current Outpatient Medications on File Prior to Visit Medication       Sig       Dispense         Refill            baclofen (LIORESAL) 10 MG tablet   Take 10 mg by mouth 3 (three) times daily as needed                                ergocalciferol, vitamin D2, 1,250 mcg (50,000 unit) capsule                                        FLUoxetine (PROZAC) 40 MG capsule         1 capsule                                   HUMALOG U-100 INSULIN injection (concentration 100 units/mL) USE UP TO 100 UNITS PER DAY IN INSULIN PUMP                              hydrOXYchloroQUINE (PLAQUENIL) 200 mg tablet 1 tablet                                      pantoprazole (PROTONIX) 40 MG DR tablet            TAKE 1 TABLET BY MOUTH EVERY DAY 30 MINUTES BEFORE A MEAL ON AN EMPTY STOMACH                                     traZODone (DESYREL) 50 MG tablet            2 tablets at bedtime as needed                           No current facility-administered medications on file prior to visit.     Family History Family History Problem           Relation           Age of Onset            Obesity            Mother         Social History   Tobacco Use Smoking Status           Never Smokeless Tobacco   Never     Social History Social History     Socioeconomic History            Marital status:  Single Tobacco Use            Smoking status:          Never            Smokeless tobacco:     Never       Objective:     Vitals:              06/24/21 1353 Pulse:  95 Weight:            (!) 115.8 kg (255 lb 3.2 oz) Height: 162.6 cm (5\' 4" )   Body mass index is 43.8 kg/m.   Physical Exam Constitutional:      Appearance: Normal appearance.  HENT:     Head: Normocephalic and atraumatic.     Mouth/Throat:     Mouth: Mucous membranes are moist.     Pharynx: Oropharynx is clear.  Eyes:     General: No scleral icterus.    Pupils: Pupils are equal, round, and reactive to light.  Cardiovascular:     Rate and Rhythm: Normal rate and regular rhythm.     Pulses: Normal pulses.     Heart sounds: No murmur heard.   No friction rub. No gallop.  Pulmonary:     Effort: Pulmonary effort is normal. No respiratory distress.     Breath sounds: Normal breath sounds. No stridor.  Abdominal:     General: Abdomen is flat.  Musculoskeletal:        General: No swelling.  Skin:    General: Skin is warm.  Neurological:     General: No focal deficit present.     Mental Status: She is alert and oriented to person, place, and time. Mental status is at baseline.  Psychiatric:        Mood and Affect: Mood normal.        Thought Content: Thought content normal.        Judgment: Judgment normal.        Assessment and Plan: Diagnoses and all orders for this visit:   Symptomatic cholelithiasis     [name] is a 31 y.o. female    1.          We will proceed to the OR for a lap cholecystectomy. 2.         All risks and benefits were discussed with the patient to generally include: infection, bleeding, possible need for post op ERCP, damage to the bile ducts, and bile leak. Alternatives were offered and described.  All questions were answered and the patient voiced understanding of the procedure and wishes to proceed at this point with a laparoscopic cholecystectomy           No follow-ups on file.   Ralene Ok, MD, [facility], [place] [facility]  [facility]

## 2021-11-11 NOTE — Transfer of Care (Signed)
Immediate Anesthesia Transfer of Care Note  Patient: [name]  Procedure(s) Performed: LAPAROSCOPIC CHOLECYSTECTOMY (Abdomen)  Patient Location: PACU  Anesthesia Type:General  Level of Consciousness: awake, alert  and oriented  Airway & Oxygen Therapy: Patient Spontanous Breathing  Post-op Assessment: Report given to RN and Post -op Vital signs reviewed and stable  Post vital signs: Reviewed and stable  Last Vitals:  Vitals Value Taken Time  BP 159/98 11/11/21 1433  Temp    Pulse 82 11/11/21 1436  Resp 13 11/11/21 1436  SpO2 100 % 11/11/21 1436  Vitals shown include unvalidated device data.  Last Pain:  Vitals:   11/11/21 1205  TempSrc:   PainSc: 0-No pain         Complications: No notable events documented.

## 2021-11-11 NOTE — Op Note (Signed)
11/11/2021  2:16 PM  PATIENT:  [name]  31 y.o. female  PRE-OPERATIVE DIAGNOSIS:  BILIARY DYSKINESIA  POST-OPERATIVE DIAGNOSIS:  BILIARY DYSKINESIA  PROCEDURE:  Procedure(s): LAPAROSCOPIC CHOLECYSTECTOMY (N/A)  SURGEON:  Surgeon(s) and Role:    * Axel Filler, MD - Primary  PHYSICIAN ASSISTANT: Patrici Ranks, RNFA   ANESTHESIA:   local and general  EBL:  15 mL   BLOOD ADMINISTERED:none  DRAINS: none   LOCAL MEDICATIONS USED:  BUPIVICAINE   SPECIMEN:  Source of Specimen:  gallbladder  DISPOSITION OF SPECIMEN:  N/A  COUNTS:  YES  TOURNIQUET:  * No tourniquets in log *  DICTATION: .Dragon Dictation The patient was taken to the operating and placed in the supine position with bilateral SCDs in place.  The patient was prepped and draped in the usual sterile fashion. A time out was called and all facts were verified. A pneumoperitoneum was obtained via A Veress needle technique to a pressure of 41mm of mercury.  A 6mm trochar was then placed in the right upper quadrant under visualization, and there were no injuries to any abdominal organs. A 11 mm port was then placed in the umbilical region after infiltrating with local anesthesia under direct visualization. A second and third epigastric port and right lower quadrant port placement under direct visualization, respectively.    The gallbladder was identified and retracted, the peritoneum was then sharply dissected from the gallbladder and this dissection was carried down to Calot's triangle. The cystic duct was identified and stripped away circumferentially and seen going into the gallbladder 360, the critical angle was obtained.  2 clips were placed proximally one distally and the cystic duct transected. The cystic artery was identified and 2 clips placed proximally and one distally and transected.  We then proceeded to remove the gallbladder off the hepatic fossa with Bovie cautery. A retrieval bag was then placed in  the abdomen and gallbladder placed in the bag. The hepatic fossa was then reexamined and hemostasis was achieved with Bovie cautery and was excellent at the end of the case.   The subhepatic fossa and perihepatic fossa was then irrigated until the effluent was clear.  The gallbladder and bag were removed from the abdominal cavity. The 11 mm trocar fascia was reapproximated with the Endo Close #1 Vicryl x2.  The pneumoperitoneum was evacuated and all trochars removed under direct visulalization.  The skin was then closed with 4-0 Monocryl and the skin dressed with Dermabond.    The patient was awaken from general anesthesia and taken to the recovery room in stable condition.    PLAN OF CARE: Discharge to home after PACU  PATIENT DISPOSITION:  PACU - hemodynamically stable.   Delay start of Pharmacological VTE agent (>24hrs) due to surgical blood loss or risk of bleeding: not applicable

## 2021-11-11 NOTE — Anesthesia Preprocedure Evaluation (Addendum)
Anesthesia Evaluation  Patient identified by MRN, date of birth, ID band Patient awake    Reviewed: Allergy & Precautions, NPO status , Patient's Chart, lab work & pertinent test results  Airway Mallampati: II  TM Distance: >3 FB Neck ROM: Full    Dental  (+) Teeth Intact, Dental Advisory Given   Pulmonary asthma ,    breath sounds clear to auscultation       Cardiovascular hypertension, + dysrhythmias  Rhythm:Regular Rate:Normal     Neuro/Psych  Headaches, PSYCHIATRIC DISORDERS Anxiety Depression    GI/Hepatic Neg liver ROS, GERD  ,  Endo/Other  diabetes, Type 1, Insulin Dependent  Renal/GU Renal disease     Musculoskeletal  (+) Arthritis ,   Abdominal (+) + obese,   Peds  Hematology   Anesthesia Other Findings   Reproductive/Obstetrics                            Anesthesia Physical Anesthesia Plan  ASA: 3  Anesthesia Plan: General   Post-op Pain Management:    Induction: Intravenous  PONV Risk Score and Plan: 4 or greater and Ondansetron, Dexamethasone and Midazolam  Airway Management Planned: Oral ETT  Additional Equipment: None  Intra-op Plan:   Post-operative Plan: Extubation in OR  Informed Consent: I have reviewed the patients History and Physical, chart, labs and discussed the procedure including the risks, benefits and alternatives for the proposed anesthesia with the patient or authorized representative who has indicated his/her understanding and acceptance.     Dental advisory given  Plan Discussed with: CRNA  Anesthesia Plan Comments: (Lab Results      Component                Value               Date                      WBC                      6.5                 11/01/2021                HGB                      10.7 (L)            11/01/2021                HCT                      34.0 (L)            11/01/2021                MCV                       84.2                11/01/2021                PLT                      131 (L)             11/01/2021           )  Anesthesia Quick Evaluation  

## 2021-11-11 NOTE — Anesthesia Procedure Notes (Signed)
Procedure Name: Intubation Date/Time: 11/11/2021 1:41 PM  Performed by: Waynard Edwards, CRNAPre-anesthesia Checklist: Patient identified, Emergency Drugs available, Suction available and Patient being monitored Patient Re-evaluated:Patient Re-evaluated prior to induction Oxygen Delivery Method: Circle system utilized Preoxygenation: Pre-oxygenation with 100% oxygen Induction Type: IV induction Ventilation: Mask ventilation without difficulty Laryngoscope Size: Miller and 2 Grade View: Grade I Tube type: Oral Tube size: 7.0 mm Number of attempts: 1 Airway Equipment and Method: Stylet Placement Confirmation: ETT inserted through vocal cords under direct vision, positive ETCO2 and breath sounds checked- equal and bilateral Secured at: 22 cm Tube secured with: Tape Dental Injury: Teeth and Oropharynx as per pre-operative assessment

## 2021-11-11 NOTE — Discharge Instructions (Signed)
[facility] ______[facility] LAPAROSCOPIC SURGERY: POST OP INSTRUCTIONS Always review your discharge instruction sheet given to you by the facility where your surgery was performed. IF YOU HAVE DISABILITY OR FAMILY LEAVE FORMS, YOU MUST BRING THEM TO THE OFFICE FOR PROCESSING.   DO NOT GIVE THEM TO YOUR DOCTOR.  A prescription for pain medication may be given to you upon discharge.  Take your pain medication as prescribed, if needed.  If narcotic pain medicine is not needed, then you may take acetaminophen (Tylenol) or ibuprofen (Advil) as needed. Take your usually prescribed medications unless otherwise directed. If you need a refill on your pain medication, please contact your pharmacy.  They will contact our office to request authorization. Prescriptions will not be filled after 5pm or on week-ends. You should follow a light diet the first few days after arrival home, such as soup and crackers, etc.  Be sure to include lots of fluids daily. Most patients will experience some swelling and bruising in the area of the incisions.  Ice packs will help.  Swelling and bruising can take several days to resolve.  It is common to experience some constipation if taking pain medication after surgery.  Increasing fluid intake and taking a stool softener (such as Colace) will usually help or prevent this problem from occurring.  A mild laxative (Milk of Magnesia or Miralax) should be taken according to package instructions if there are no bowel movements after 48 hours. Unless discharge instructions indicate otherwise, you may remove your bandages 24-48 hours after surgery, and you may shower at that time.  You may have steri-strips (small skin tapes) in place directly over the incision.  These strips should be left on the skin for 7-10 days.  If your surgeon used skin glue on the incision, you may shower in 24 hours.  The glue will flake off over the next 2-3 weeks.  Any sutures or staples will be  removed at the office during your follow-up visit. ACTIVITIES:  You may resume regular (light) daily activities beginning the next day--such as daily self-care, walking, climbing stairs--gradually increasing activities as tolerated.  You may have sexual intercourse when it is comfortable.  Refrain from any heavy lifting or straining until approved by your doctor. You may drive when you are no longer taking prescription pain medication, you can comfortably wear a seatbelt, and you can safely maneuver your car and apply brakes. RETURN TO WORK:  __________________________________________________________ You should see your doctor in the office for a follow-up appointment approximately 2-3 weeks after your surgery.  Make sure that you call for this appointment within a day or two after you arrive home to insure a convenient appointment time. OTHER INSTRUCTIONS: __________________________________________________________________________________________________________________________ __________________________________________________________________________________________________________________________ WHEN TO CALL YOUR DOCTOR: Fever over 101.0 Inability to urinate Continued bleeding from incision. Increased pain, redness, or drainage from the incision. Increasing abdominal pain  The clinic staff is available to answer your questions during regular business hours.  Please don't hesitate to call and ask to speak to one of the nurses for clinical concerns.  If you have a medical emergency, go to the nearest emergency room or call 911.  A surgeon from [facility] is always on call at the hospital. [address], [address], [place], [place]  [address] ? [address] [address], [place], [place]   [address] [phone] ? [phone] ? FAX [phone] Web site: [email or web address]  

## 2021-11-11 NOTE — Anesthesia Postprocedure Evaluation (Signed)
Anesthesia Post Note  Patient: [name]  Procedure(s) Performed: LAPAROSCOPIC CHOLECYSTECTOMY (Abdomen)     Patient location during evaluation: PACU Anesthesia Type: General Level of consciousness: awake and alert Pain management: pain level controlled Vital Signs Assessment: post-procedure vital signs reviewed and stable Respiratory status: spontaneous breathing, nonlabored ventilation, respiratory function stable and patient connected to nasal cannula oxygen Cardiovascular status: blood pressure returned to baseline and stable Postop Assessment: no apparent nausea or vomiting Anesthetic complications: no   No notable events documented.  Last Vitals:  Vitals:   11/11/21 1520 11/11/21 1535  BP: (!) 179/100 (!) 161/93  Pulse: 70   Resp: 14 17  Temp:  36.4 C  SpO2: 100%     Last Pain:  Vitals:   11/11/21 1535  TempSrc:   PainSc: 6                  Shelton Silvas

## 2021-11-12 ENCOUNTER — Encounter (HOSPITAL_COMMUNITY): Payer: Self-pay | Admitting: General Surgery

## 2021-11-12 LAB — SURGICAL PATHOLOGY

## 2021-12-20 ENCOUNTER — Encounter: Payer: Self-pay | Admitting: Hematology and Oncology

## 2021-12-20 ENCOUNTER — Inpatient Hospital Stay
Payer: No Typology Code available for payment source | Attending: Hematology and Oncology | Admitting: Hematology and Oncology

## 2021-12-20 ENCOUNTER — Other Ambulatory Visit: Payer: Self-pay

## 2021-12-20 VITALS — BP 166/88 | HR 88 | Temp 98.1°F | Wt 270.4 lb

## 2021-12-20 DIAGNOSIS — Z79899 Other long term (current) drug therapy: Secondary | ICD-10-CM | POA: Diagnosis not present

## 2021-12-20 DIAGNOSIS — D649 Anemia, unspecified: Secondary | ICD-10-CM | POA: Diagnosis present

## 2021-12-20 DIAGNOSIS — E108 Type 1 diabetes mellitus with unspecified complications: Secondary | ICD-10-CM | POA: Diagnosis not present

## 2021-12-20 DIAGNOSIS — M069 Rheumatoid arthritis, unspecified: Secondary | ICD-10-CM | POA: Diagnosis not present

## 2021-12-20 DIAGNOSIS — I10 Essential (primary) hypertension: Secondary | ICD-10-CM | POA: Insufficient documentation

## 2021-12-20 DIAGNOSIS — Z9884 Bariatric surgery status: Secondary | ICD-10-CM | POA: Insufficient documentation

## 2021-12-20 DIAGNOSIS — D500 Iron deficiency anemia secondary to blood loss (chronic): Secondary | ICD-10-CM

## 2021-12-20 DIAGNOSIS — E669 Obesity, unspecified: Secondary | ICD-10-CM | POA: Diagnosis not present

## 2021-12-20 NOTE — Progress Notes (Signed)
[name] CONSULT NOTE  Patient Care Team: Jonathon Jordan, MD as PCP - General (Family Medicine)  CHIEF COMPLAINTS/PURPOSE OF CONSULTATION:  Anemia, unspecified, follow-up  ASSESSMENT & PLAN:   This is a very pleasant 31 year old female patient with juvenile diabetes, rheumatoid arthritis on Plaquenil, hypertension, obesity status post gastric sleeve surgery status post IV Venofer last dose in September 2022 here for follow-up.  Since last visit, she had cholecystectomy.  Abdomen pain has significantly improved.  She also lost about 20 pounds of weight, she attributes this to the surgery, partly intentional.  She however complains of worsening fatigue, pica and wonders if she is iron deficient again.  She continues to take oral iron once a day.  Physical examination today without any remarkable findings.  We will repeat CBC, iron panel and ferritin today.  If she does have severe iron deficiency, we will consider intravenous Venofer again. RTC in 6 months or sooner as needed  No problem-specific Assessment & Plan notes found for this encounter.  Thank you for consulting [name] the care of this patient.  Please not hesitate to contact us with any additional questions or concerns  HISTORY OF PRESENTING ILLNESS:   [name] 31 y.o. female is here because of anemia.  This is a very pleasant 31 year old female patient with past medical history significant for juvenile diabetes, rheumatoid arthritis on Plaquenil, gastric sleeve surgery referred to hematology for evaluation of anemia.  During her last visit, we have agreed to proceeding with intravenous iron infusion.  She completed her Venofer on 12/02/2020 and is here for follow-up   Since last visit, she had a cholecystectomy in Aug 2023. She has also noticed more fatigue since end of July. No matter how much she sleeps, she feels like she has no energy. She is very cold, craving ice. Last menstrual cycle was very heavy. She  feels lot of muscle pains today and has noticed more migraine attacks. She is still taking oral iron once a day. Rest of the pertinent 10 point ROS reviewed and negative.  MEDICAL HISTORY:  Past Medical History:  Diagnosis Date   Anemia    Anxiety    Arthritis    Asthma    asthma as a child, no problems as an adult   Diabetes mellitus without complication (HCC)    type1 - insulin pump   Dysrhythmia    GERD (gastroesophageal reflux disease)    Headache    HLD (hyperlipidemia)    diet controlled   Hypertension    no meds   Neuromuscular disorder ([place]) 2015   periph. neuropathy- no current problems   Vitamin D deficiency     SURGICAL HISTORY: Past Surgical History:  Procedure Laterality Date   CATARACT EXTRACTION, BILATERAL Bilateral 2022   CHOLECYSTECTOMY N/A 11/11/2021   Procedure: LAPAROSCOPIC CHOLECYSTECTOMY;  Surgeon: Ralene Ok, MD;  Location: [place];  Service: General;  Laterality: N/A;   GASTRIC BYPASS  07/2017   WISDOM TOOTH EXTRACTION      SOCIAL HISTORY: Social History   Socioeconomic History   Marital status: Single    Spouse name: Not on file   Number of children: Not on file   Years of education: Not on file   Highest education level: Not on file  Occupational History   Not on file  Tobacco Use   Smoking status: Never    Passive exposure: Never   Smokeless tobacco: Never  Vaping Use   Vaping Use: Never used  Substance and  Sexual Activity   Alcohol use: Yes    Comment: occasional wine/liquor   Drug use: Never   Sexual activity: Yes    Birth control/protection: None  Other Topics Concern   Not on file  Social History Narrative   Not on file   Social Determinants of Health   Financial Resource Strain: Not on file  Food Insecurity: Not on file  Transportation Needs: Not on file  Physical Activity: Not on file  Stress: Not on file  Social Connections: Not on file  Intimate Partner Violence: Not on file    FAMILY HISTORY: Family  History  Problem Relation Age of Onset   Lupus Maternal Grandmother    Asthma Sister    ADD / ADHD Sister     ALLERGIES:  is allergic to fruit extracts, latex, and zinc.  MEDICATIONS:  Current Outpatient Medications  Medication Sig Dispense Refill   albuterol (PROVENTIL HFA;VENTOLIN HFA) 108 (90 Base) MCG/ACT inhaler Inhale 2 puffs into the lungs every 4 (four) hours as needed for wheezing.     baclofen (LIORESAL) 10 MG tablet Take 10 mg by mouth 3 (three) times daily as needed for muscle spasms.     ergocalciferol (VITAMIN D2) 1.25 MG (50000 UT) capsule Take 50,000 Units by mouth every Thursday.     HUMALOG 100 UNIT/ML injection Inject 100 Units as directed See admin instructions. Inject up to 100 units per day via insulin pump as directed  9   HYDROcodone-acetaminophen (NORCO/VICODIN) 5-325 MG tablet Take 1 tablet by mouth every 6 (six) hours as needed for moderate pain or severe pain.     hydroxychloroquine (PLAQUENIL) 200 MG tablet TAKE 1 TABLET(200 MG) BY MOUTH TWICE DAILY 180 tablet 0   Lactobacillus (PROBIOTIC ACIDOPHILUS) TABS Take 1 tablet by mouth daily.     ondansetron (ZOFRAN ODT) 4 MG disintegrating tablet Take 1 tablet (4 mg total) by mouth every 8 (eight) hours as needed for up to 15 doses for nausea or vomiting. 15 tablet 0   pantoprazole (PROTONIX) 40 MG tablet Take 40 mg by mouth daily.     prazosin (MINIPRESS) 2 MG capsule Take 2 mg by mouth at bedtime as needed (sleep).     traMADol (ULTRAM) 50 MG tablet Take 1 tablet (50 mg total) by mouth every 6 (six) hours as needed. 20 tablet 0   traMADol (ULTRAM) 50 MG tablet Take 1 tablet (50 mg total) by mouth every 6 (six) hours as needed. 20 tablet 0   traZODone (DESYREL) 50 MG tablet Take 100 mg by mouth at bedtime as needed for sleep.     triamcinolone cream (KENALOG) 0.1 % Apply 1 application  topically as needed (dry, itch skin). Apply 1-2 times a day to affected areas of dry itchy skin on the body until clear, then as  needed. Not to face.     No current facility-administered medications for this visit.     PHYSICAL EXAMINATION:  ECOG PERFORMANCE STATUS: 0 - Asymptomatic  Vitals:   12/20/21 1354  BP: (!) 166/88  Pulse: 88  Temp: 98.1 F (36.7 C)  SpO2: 100%     Filed Weights   12/20/21 1354  Weight: 270 lb 6.4 oz (122.7 kg)   Physical Exam Constitutional:      Appearance: Normal appearance.  HENT:     Head: Normocephalic and atraumatic.  Cardiovascular:     Rate and Rhythm: Normal rate and regular rhythm.     Pulses: Normal pulses.     Heart sounds:  Normal heart sounds.  Pulmonary:     Effort: Pulmonary effort is normal.     Breath sounds: Normal breath sounds.  Musculoskeletal:        General: No swelling or tenderness.     Cervical back: Normal range of motion and neck supple. No rigidity.  Lymphadenopathy:     Cervical: No cervical adenopathy.  Skin:    General: Skin is warm.  Neurological:     General: No focal deficit present.     Mental Status: She is alert.  Psychiatric:        Mood and Affect: Mood normal.      LABORATORY DATA:  I have reviewed the data as listed Lab Results  Component Value Date   WBC 6.5 11/01/2021   HGB 10.7 (L) 11/01/2021   HCT 34.0 (L) 11/01/2021   MCV 84.2 11/01/2021   PLT 131 (L) 11/01/2021     Chemistry      Component Value Date/Time   NA 140 11/01/2021 1604   K 4.4 11/01/2021 1604   CL 108 11/01/2021 1604   CO2 24 11/01/2021 1604   BUN 13 11/01/2021 1604   CREATININE 0.71 11/01/2021 1604      Component Value Date/Time   CALCIUM 8.7 11/01/2021 1604   ALKPHOS 63 08/20/2021 1315   AST 10 11/01/2021 1604   AST 14 (L) 08/20/2021 1315   ALT 11 11/01/2021 1604   ALT 11 08/20/2021 1315   BILITOT 0.5 11/01/2021 1604   BILITOT 0.6 08/20/2021 1315       RADIOGRAPHIC STUDIES: I have personally reviewed the radiological images as listed and agreed with the findings in the report. No results found.  All questions were  answered. The patient knows to call the clinic with any problems, questions or concerns.  Total time spent: 20 min including history and physical, review of records, counseling and coordination of care.    Benay Pike, MD 12/20/2021 1:58 PM

## 2021-12-21 ENCOUNTER — Inpatient Hospital Stay: Payer: No Typology Code available for payment source

## 2021-12-21 DIAGNOSIS — D500 Iron deficiency anemia secondary to blood loss (chronic): Secondary | ICD-10-CM

## 2021-12-21 DIAGNOSIS — D649 Anemia, unspecified: Secondary | ICD-10-CM | POA: Diagnosis not present

## 2021-12-21 LAB — CBC WITH DIFFERENTIAL/PLATELET
Abs Immature Granulocytes: 0.02 10*3/uL (ref 0.00–0.07)
Basophils Absolute: 0.1 10*3/uL (ref 0.0–0.1)
Basophils Relative: 1 %
Eosinophils Absolute: 0.3 10*3/uL (ref 0.0–0.5)
Eosinophils Relative: 3 %
HCT: 32.9 % — ABNORMAL LOW (ref 36.0–46.0)
Hemoglobin: 10.4 g/dL — ABNORMAL LOW (ref 12.0–15.0)
Immature Granulocytes: 0 %
Lymphocytes Relative: 51 %
Lymphs Abs: 5.2 10*3/uL — ABNORMAL HIGH (ref 0.7–4.0)
MCH: 25.9 pg — ABNORMAL LOW (ref 26.0–34.0)
MCHC: 31.6 g/dL (ref 30.0–36.0)
MCV: 81.8 fL (ref 80.0–100.0)
Monocytes Absolute: 1 10*3/uL (ref 0.1–1.0)
Monocytes Relative: 10 %
Neutro Abs: 3.5 10*3/uL (ref 1.7–7.7)
Neutrophils Relative %: 35 %
Platelets: 237 10*3/uL (ref 150–400)
RBC: 4.02 MIL/uL (ref 3.87–5.11)
RDW: 13.9 % (ref 11.5–15.5)
WBC: 10.1 10*3/uL (ref 4.0–10.5)
nRBC: 0 % (ref 0.0–0.2)

## 2021-12-21 LAB — IRON AND IRON BINDING CAPACITY (CC-WL,HP ONLY)
Iron: 27 ug/dL — ABNORMAL LOW (ref 28–170)
Saturation Ratios: 8 % — ABNORMAL LOW (ref 10.4–31.8)
TIBC: 326 ug/dL (ref 250–450)
UIBC: 299 ug/dL (ref 148–442)

## 2021-12-21 LAB — FERRITIN: Ferritin: 23 ng/mL (ref 11–307)

## 2021-12-30 ENCOUNTER — Telehealth: Payer: Self-pay | Admitting: *Deleted

## 2021-12-30 MED ORDER — FERROUS SULFATE 325 (65 FE) MG PO TBEC: 325.0000 mg | DELAYED_RELEASE_TABLET | Freq: Two times a day (BID) | ORAL | 3 refills | Status: DC

## 2021-12-30 NOTE — Telephone Encounter (Signed)
This RN spoke with pt per lab review and need for additional iron- pt states she is currently taking ferrous sulfate 1 tab a day.  She has no issues or side effects with use.  Per discussion and need for additional iron intake - pt will increase the dose to 1 tab twice a day- new prescription ( per her request ) sent to her pharmacy.  Pt understands to call if she has any problems with increased dose.  Lab appt request sent to scheduling for Jan 2024.

## 2022-01-17 NOTE — Progress Notes (Signed)
Office Visit Note  Patient: [name]             Date of Birth: [birth year]           MRN: [id number]             PCP: Jonathon Jordan, MD Referring: Jonathon Jordan, MD Visit Date: 01/31/2022 Occupation: _0 @  Subjective:  Right ankle joint pain   History of Present Illness: [name] is a 31 y.o. female with history of seronegative rheumatoid arthritis.  Patient is currently taking Plaquenil 200 mg 1 tablet by mouth twice daily.  She is tolerating Plaquenil without any side effects and has not missed any doses recently.  She denies any signs or symptoms of a rheumatoid arthritis flare recently.  She continues to follow-up with pain management on a regular basis and has an upcoming appointment scheduled tomorrow to have a left shoulder and lower back injection.  She states for the past 1 months she has had some increased discomfort in the right ankle and foot.  She describes the pain as a burning sensation on the bottom of her foot.  She currently rates the pain a 6 out of 10.  She stated the pain is worse while weightbearing.  She denies any injury or fall prior to the onset of symptoms.  She denies any joint swelling. Patient reports that she was evaluated by nephrology for proteinuria.  She was told that the protein was felt to be due to underlying diabetes.  She will be following up with the nephrologist every 6 months. Patient reports that she is having a flare of eczema on her right forearm.  She no longer has a prescription for triamcinolone which was previously prescribed by her dermatologist.  She requested a refill of triamcinolone which has helped her eczema in the past.  She denies any facial rashes recently. She denies any other new or worsening symptoms.      Activities of Daily Living:  Patient reports morning stiffness for 30-45 minutes.   Patient Reports nocturnal pain.  Difficulty dressing/grooming: Denies Difficulty climbing stairs: Denies Difficulty  getting out of chair: Denies Difficulty using hands for taps, buttons, cutlery, and/or writing: Reports  Review of Systems  Constitutional:  Positive for fatigue.  HENT:  Negative for mouth sores and mouth dryness.   Eyes:  Negative for dryness.  Respiratory:  Negative for shortness of breath.   Cardiovascular:  Negative for chest pain and palpitations.  Gastrointestinal:  Negative for blood in stool, constipation and diarrhea.  Endocrine: Negative for increased urination.  Genitourinary:  Negative for involuntary urination.  Musculoskeletal:  Positive for joint pain, joint pain, joint swelling, myalgias, muscle weakness, morning stiffness, muscle tenderness and myalgias.  Skin:  Positive for rash and sensitivity to sunlight. Negative for color change and hair loss.  Allergic/Immunologic: Negative for susceptible to infections.  Neurological:  Positive for headaches. Negative for dizziness.  Hematological:  Positive for swollen glands.  Psychiatric/Behavioral:  Positive for sleep disturbance. Negative for depressed mood. The patient is nervous/anxious.     PMFS History:  Patient Active Problem List   Diagnosis Date Noted   IDA (iron deficiency anemia) 11/06/2020   Intractable abdominal pain 07/26/2020   Right upper quadrant abdominal pain 07/26/2020   Acute pyelonephritis 07/26/2020   Diabetes mellitus type 1, controlled, with complications ([place]) 28/76/8115   Obesity, Class III, BMI 40-49.9 (morbid obesity) ([place]) 07/26/2020   Enterococcus faecalis infection 07/26/2020   Essential hypertension 07/26/2020  RUQ abdominal pain 07/26/2020   Chronic low back pain 11/22/2013   Clinical depression 10/07/2013   Extreme obesity 10/07/2013   Diabetic neuropathy ([place]) 08/06/2013   General patient noncompliance 12/21/2012   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[place]) 12/30/2010   Appetite disorder 12/30/2010   BP (high blood pressure) 12/30/2010   Muscle ache 12/30/2010   Cachectic ([place]) 12/30/2010    Patellofemoral disorder of left knee 09/03/2010   Diabetes mellitus 09/03/2010    Past Medical History:  Diagnosis Date   Anemia    Anxiety    Arthritis    Asthma    asthma as a child, no problems as an adult   Diabetes mellitus without complication (HCC)    type1 - insulin pump   Dysrhythmia    GERD (gastroesophageal reflux disease)    Headache    HLD (hyperlipidemia)    diet controlled   Hypertension    no meds   Neuromuscular disorder ([place]) 2015   periph. neuropathy- no current problems   Vitamin D deficiency     Family History  Problem Relation Age of Onset   Lupus Maternal Grandmother    Asthma Sister    ADD / ADHD Sister    Past Surgical History:  Procedure Laterality Date   CATARACT EXTRACTION, BILATERAL Bilateral 2022   CHOLECYSTECTOMY N/A 11/11/2021   Procedure: LAPAROSCOPIC CHOLECYSTECTOMY;  Surgeon: Ralene Ok, MD;  Location: [place];  Service: General;  Laterality: N/A;   GASTRIC BYPASS  07/2017   WISDOM TOOTH EXTRACTION     Social History   Social History Narrative   Not on file   Immunization History  Administered Date(s) Administered   Influenza Inj Mdck Quad Pf 02/11/2018   PFIZER(Purple Top)SARS-COV-2 Vaccination 05/28/2019, 06/18/2019, 03/06/2020, 09/13/2020   Pneumococcal Polysaccharide-23 10/07/2013     Objective: Vital Signs: BP (!) 173/107 (BP Location: Left Arm, Patient Position: Sitting, Cuff Size: Large)   Pulse 98   Resp 16   Ht 5' 4.75" (1.645 m)   Wt 264 lb 9.6 oz (120 kg)   BMI 44.37 kg/m    Physical Exam Vitals and nursing note reviewed.  Constitutional:      Appearance: She is well-developed.  HENT:     Head: Normocephalic and atraumatic.  Eyes:     Conjunctiva/sclera: Conjunctivae normal.  Cardiovascular:     Rate and Rhythm: Normal rate and regular rhythm.     Heart sounds: Normal heart sounds.  Pulmonary:     Effort: Pulmonary effort is normal.     Breath sounds: Normal breath sounds.  Abdominal:      General: Bowel sounds are normal.     Palpations: Abdomen is soft.  Musculoskeletal:     Cervical back: Normal range of motion.  Skin:    General: Skin is warm and dry.     Capillary Refill: Capillary refill takes less than 2 seconds.  Neurological:     Mental Status: She is alert and oriented to person, place, and time.  Psychiatric:        Behavior: Behavior normal.      Musculoskeletal Exam: C-spine has slightly limited range of motion with lateral rotation.  Trapezius muscle tension and tenderness noted bilaterally, left greater than right.  Painful range of motion of the left shoulder joint.  Elbow joints have good range of motion with no tenderness or synovitis.  Wrist joints, MCPs, PIPs, DIPs have good range of motion with no synovitis.  Complete fist formation bilaterally.  Hip joints have good range of motion  with no groin pain.  Some tenderness over bilateral trochanteric bursa.  Knee joints have good range of motion with no warmth or effusion.  Ankle joints have good range of motion with no joint tenderness or synovitis.  She has some tenderness along the Achilles tendon and plantar fascia of the right foot.  No tenderness or synovitis over MTP joints.  CDAI Exam: CDAI Score: -- Patient Global: 3 mm; Provider Global: 3 mm Swollen: --; Tender: -- Joint Exam 01/31/2022   No joint exam has been documented for this visit   There is currently no information documented on the homunculus. Go to the Rheumatology activity and complete the homunculus joint exam.  Investigation: No additional findings.  Imaging: No results found.  Recent Labs: Lab Results  Component Value Date   WBC 10.1 12/21/2021   HGB 10.4 (L) 12/21/2021   PLT 237 12/21/2021   NA 140 11/01/2021   K 4.4 11/01/2021   CL 108 11/01/2021   CO2 24 11/01/2021   GLUCOSE 55 (L) 11/01/2021   BUN 13 11/01/2021   CREATININE 0.71 11/01/2021   BILITOT 0.5 11/01/2021   ALKPHOS 63 08/20/2021   AST 10 11/01/2021    ALT 11 11/01/2021   PROT 6.8 11/01/2021   ALBUMIN 3.3 (L) 08/20/2021   CALCIUM 8.7 11/01/2021   GFRAA 139 04/17/2020    Speciality Comments: PLQ eye exam: 08/31/2021 WNL @ [facility]. Follow up in 1 year.  Procedures:  No procedures performed Allergies: Fruit extracts, Latex, and Zinc     Assessment / Plan:     Visit Diagnoses: Seronegative rheumatoid arthritis ([place]) - U/S+ syonvitis Bilateral wrists/left MCP, RF-, CCP-, 14-3-3 eta negative: She has no synovitis on examination today.  She has not had any signs or symptoms of a rheumatoid arthritis flare.  Patient had ultrasound of both hands performed on 05/27/2021 which was negative for synovitis and tenosynovitis.  Overall she has clinically been doing well taking Plaquenil 200 mg 1 tablet by mouth twice daily.  She is tolerating Plaquenil without any side effects and has not missed any doses recently.  No medication changes will be made at this time.  A refill of Plaquenil was sent to the pharmacy today.  She was advised to notify us if she develops signs or symptoms of a flare.  She will follow-up in the office in 3 months or sooner if needed.- Plan: hydroxychloroquine (PLAQUENIL) 200 MG tablet  High risk medication use - Plaquenil 200 mg 1 tablet by mouth twice daily.  CBC was drawn on 12/21/2021.  CMP drawn on 11/01/2021.  Orders for CBC and CMP were released today.  Patient would like to get to need to have updated lab work drawn with her appointments. PLQ eye exam: 08/31/2021 WNL @ [facility]. Follow up in 1 year.  Plan: CBC with Differential/Platelet, COMPLETE METABOLIC PANEL WITH GFR  Pain in both hands: X-rays of both hands were unremarkable on 02/25/2021.  No radiographic progression was noted when compared to x-rays from 2020.  She had an ultrasound of both hands on 05/27/2021 which did not reveal any synovitis or tenosynovitis.  She continues to experience intermittent pain and stiffness in both hands.  On  examination today she had no synovitis.  She remains on Plaquenil as prescribed and takes tramadol and hydrocodone as needed for pain relief.  Carpal tunnel syndrome, right upper limb: Asymptomatic currently.  Bilateral median nerves were within normal limits on ultrasound from 05/27/2021.  Chronic pain of right knee: Her  right knee joint pain has been tolerable.  She has had cortisone injections as well as Visco gel injections in the past which have been helpful.  On examination she has good range of motion of the right knee joint.  No warmth or effusion noted.  Patellofemoral disorder of left knee: She has good range of motion of the left knee joint on examination today.  No warmth or effusion noted.  Chronic SI joint pain: Chronic pain.   Spondylolysis, lumbar region - Followed by pain management.  According to the patient she is scheduled for a lower back injection tomorrow at pain management.  She has been taking hydrocodone every 6 hours as needed for moderate to severe pain relief and tramadol every 6 hours for pain relief.   Positive ANA (antinuclear antibody) - ANA positive 11/15/19. 06/17/2020 ANA negative, complements within normal limits, ESR within normal limits, double-stranded DNA negative, CK within normal limits. - Plan: CBC with Differential/Platelet, COMPLETE METABOLIC PANEL WITH GFR, ANA, Anti-DNA antibody, double-stranded, C3 and C4, Sedimentation rate, Protein / creatinine ratio, urine  Family history of systemic lupus erythematosus - History of positive ANA in 2021.  She has no clinical features of systemic lupus at this time.  The following lab work was obtained today for further evaluation.   - Plan: hydroxychloroquine (PLAQUENIL) 200 MG tablet, CBC with Differential/Platelet, COMPLETE METABOLIC PANEL WITH GFR, ANA, Anti-DNA antibody, double-stranded, C3 and C4, Sedimentation rate, Protein / creatinine ratio, urine  Trapezius muscle spasm: She presents today with trapezius muscle  tension and tenderness bilaterally.  She has been experiencing muscle spasms intermittently especially on the left side.  She is scheduled for left shoulder joint cortisone injection tomorrow at pain management.  She has a prescription for baclofen 10 mg 3 times daily as needed for muscle spasms.  She was provided a salonpas patch today in the office. Instructions were provided.   Myofascial pain: She continues to experience generalized myalgias and muscle tenderness consistent with myofascial pain syndrome.  On examination today she has trapezius muscle tension and tenderness bilaterally.  She has been experiencing muscle spasms intermittently.  She continues to follow-up closely with pain management.  She is scheduled for a left shoulder injection along with a lower back injection tomorrow.  Other fatigue: Chronic and secondary to insomnia.  Discussed the importance of regular exercise and good sleep hygiene.  She continues to take trazodone 100 mg at bedtime for insomnia but continues to have intermittent sleep at night due to nocturnal pain.  Chronic pain syndrome: She continues to follow-up with pain management.  She continues to take hydrocodone 1 tablet every 6 hours as needed for moderate to severe pain relief.  She also has a prescription for tramadol and takes 50 mg 1 tablet every 6 hours as needed for pain.  Plantar fasciitis of right foot: Patient presents today with increased discomfort in the right foot which started 1 month ago.  She had no injury or fall prior to the onset of symptoms.  She describes the pain as a burning sensation on the bottom of her foot.  On examination she has good range of motion of the right ankle joint with no synovitis.  She has tenderness along the plantar fascia and at the base of the Achilles tendon of the right foot.  Different treatment options were discussed today.  Patient was given a handout of exercises to perform.  She will notify us if her symptoms persist  or worsen.  Other atopic dermatitis:  Patient has a history of eczema.  She is currently having a flare on the dorsal aspect of her right forearm.  She requested a refill of triamcinolone cream to be sent to the pharmacy today.  If her symptoms persist or worsen she plans on following up with her dermatologist for further evaluation and management.  Other medical conditions are listed as follows:  History of iron deficiency anemia - Followed by Dr. Chryl Heck.  She is taking ferrous sulfate 1 tablet twice daily.  Vitamin B12 deficiency  Vitamin D deficiency  History of depression  History of gastroesophageal reflux (GERD)  Type 1 diabetes mellitus with diabetic polyneuropathy (HCC)  History of asthma  Diabetic gastroparesis (HCC)  Family history of systemic lupus erythematosus - Plan: hydroxychloroquine (PLAQUENIL) 200 MG tablet, CBC with Differential/Platelet, COMPLETE METABOLIC PANEL WITH GFR, ANA, Anti-DNA antibody, double-stranded, C3 and C4, Sedimentation rate, Protein / creatinine ratio, urine   Orders: Orders Placed This Encounter  Procedures   CBC with Differential/Platelet   COMPLETE METABOLIC PANEL WITH GFR   ANA   Anti-DNA antibody, double-stranded   C3 and C4   Sedimentation rate   Protein / creatinine ratio, urine   Meds ordered this encounter  Medications   hydroxychloroquine (PLAQUENIL) 200 MG tablet    Sig: TAKE 1 TABLET(200 MG) BY MOUTH TWICE DAILY    Dispense:  180 tablet    Refill:  0   triamcinolone cream (KENALOG) 0.1 %    Sig: Apply 1 Application topically as needed (dry, itch skin). Apply 1-2 times a day to affected areas of skin for up to two weeks as needed.    Dispense:  45 g    Refill:  0     Follow-Up Instructions: Return in about 3 months (around 05/03/2022) for Rheumatoid arthritis.   Ofilia Neas, PA-C  Note - This record has been created using Dragon software.  Chart creation errors have been sought, but may not always  have been  located. Such creation errors do not reflect on  the standard of medical care.

## 2022-01-24 ENCOUNTER — Encounter: Payer: Self-pay | Admitting: Hematology and Oncology

## 2022-01-31 ENCOUNTER — Ambulatory Visit: Payer: No Typology Code available for payment source | Attending: Physician Assistant | Admitting: Physician Assistant

## 2022-01-31 ENCOUNTER — Encounter: Payer: Self-pay | Admitting: Physician Assistant

## 2022-01-31 VITALS — BP 173/107 | HR 98 | Resp 16 | Ht 64.75 in | Wt 264.6 lb

## 2022-01-31 DIAGNOSIS — M4306 Spondylolysis, lumbar region: Secondary | ICD-10-CM

## 2022-01-31 DIAGNOSIS — M79641 Pain in right hand: Secondary | ICD-10-CM

## 2022-01-31 DIAGNOSIS — E538 Deficiency of other specified B group vitamins: Secondary | ICD-10-CM

## 2022-01-31 DIAGNOSIS — G5601 Carpal tunnel syndrome, right upper limb: Secondary | ICD-10-CM

## 2022-01-31 DIAGNOSIS — M25561 Pain in right knee: Secondary | ICD-10-CM

## 2022-01-31 DIAGNOSIS — E1042 Type 1 diabetes mellitus with diabetic polyneuropathy: Secondary | ICD-10-CM

## 2022-01-31 DIAGNOSIS — M79642 Pain in left hand: Secondary | ICD-10-CM

## 2022-01-31 DIAGNOSIS — Z79899 Other long term (current) drug therapy: Secondary | ICD-10-CM | POA: Diagnosis not present

## 2022-01-31 DIAGNOSIS — M0600 Rheumatoid arthritis without rheumatoid factor, unspecified site: Secondary | ICD-10-CM

## 2022-01-31 DIAGNOSIS — M722 Plantar fascial fibromatosis: Secondary | ICD-10-CM

## 2022-01-31 DIAGNOSIS — E1143 Type 2 diabetes mellitus with diabetic autonomic (poly)neuropathy: Secondary | ICD-10-CM

## 2022-01-31 DIAGNOSIS — G8929 Other chronic pain: Secondary | ICD-10-CM

## 2022-01-31 DIAGNOSIS — Z862 Personal history of diseases of the blood and blood-forming organs and certain disorders involving the immune mechanism: Secondary | ICD-10-CM

## 2022-01-31 DIAGNOSIS — M222X2 Patellofemoral disorders, left knee: Secondary | ICD-10-CM

## 2022-01-31 DIAGNOSIS — L2089 Other atopic dermatitis: Secondary | ICD-10-CM

## 2022-01-31 DIAGNOSIS — Z8269 Family history of other diseases of the musculoskeletal system and connective tissue: Secondary | ICD-10-CM

## 2022-01-31 DIAGNOSIS — Z8719 Personal history of other diseases of the digestive system: Secondary | ICD-10-CM

## 2022-01-31 DIAGNOSIS — M533 Sacrococcygeal disorders, not elsewhere classified: Secondary | ICD-10-CM

## 2022-01-31 DIAGNOSIS — Z8659 Personal history of other mental and behavioral disorders: Secondary | ICD-10-CM

## 2022-01-31 DIAGNOSIS — Z8709 Personal history of other diseases of the respiratory system: Secondary | ICD-10-CM

## 2022-01-31 DIAGNOSIS — K3184 Gastroparesis: Secondary | ICD-10-CM

## 2022-01-31 DIAGNOSIS — E559 Vitamin D deficiency, unspecified: Secondary | ICD-10-CM

## 2022-01-31 DIAGNOSIS — R5383 Other fatigue: Secondary | ICD-10-CM

## 2022-01-31 DIAGNOSIS — M7918 Myalgia, other site: Secondary | ICD-10-CM

## 2022-01-31 DIAGNOSIS — G894 Chronic pain syndrome: Secondary | ICD-10-CM

## 2022-01-31 DIAGNOSIS — M62838 Other muscle spasm: Secondary | ICD-10-CM

## 2022-01-31 DIAGNOSIS — R768 Other specified abnormal immunological findings in serum: Secondary | ICD-10-CM

## 2022-01-31 MED ORDER — HYDROXYCHLOROQUINE SULFATE 200 MG PO TABS: ORAL_TABLET | ORAL | 0 refills | Status: DC

## 2022-01-31 MED ORDER — TRIAMCINOLONE ACETONIDE 0.1 % EX CREA: 1.0000 | TOPICAL_CREAM | CUTANEOUS | 0 refills | Status: DC | PRN

## 2022-01-31 NOTE — Patient Instructions (Signed)
Plantar Fasciitis  Plantar fasciitis is a painful foot condition that affects the heel. It occurs when the band of tissue that connects the toes to the heel bone (plantar fascia) becomes irritated. This can happen as the result of exercising too much or doing other repetitive activities (overuse injury). Plantar fasciitis can cause mild irritation to severe pain that makes it difficult to walk or move. The pain is usually worse in the morning after sleeping, or after sitting or lying down for a period of time. Pain may also be worse after long periods of walking or standing. What are the causes? This condition may be caused by: Standing for long periods of time. Wearing shoes that do not have good arch support. Doing activities that put stress on joints (high-impact activities). This includes ballet and exercise that makes your heart beat faster (aerobic exercise), such as running. Being overweight. An abnormal way of walking (gait). Tight muscles in the back of your lower leg (calf). High arches in your feet or flat feet. Starting a new athletic activity. What are the signs or symptoms? The main symptom of this condition is heel pain. Pain may get worse after the following: Taking the first steps after a time of rest, especially in the morning after awakening, or after you have been sitting or lying down for a while. Long periods of standing still. Pain may decrease after 30-45 minutes of activity, such as gentle walking. How is this diagnosed? This condition may be diagnosed based on your medical history, a physical exam, and your symptoms. Your health care provider will check for: A tender area on the bottom of your foot. A high arch in your foot or flat feet. Pain when you move your foot. Difficulty moving your foot. You may have imaging tests to confirm the diagnosis, such as: X-rays. Ultrasound. MRI. How is this treated? Treatment for plantar fasciitis depends on how severe your  condition is. Treatment may include: Rest, ice, pressure (compression), and raising (elevating) the affected foot. This is called RICE therapy. Your health care provider may recommend RICE therapy along with over-the-counter pain medicines to manage your pain. Exercises to stretch your calves and your plantar fascia. A splint that holds your foot in a stretched, upward position while you sleep (night splint). Physical therapy to relieve symptoms and prevent problems in the future. Injections of steroid medicine (cortisone) to relieve pain and inflammation. Stimulating your plantar fascia with electrical impulses (extracorporeal shock wave therapy). This is usually the last treatment option before surgery. Surgery, if other treatments have not worked after 12 months. Follow these instructions at home: Managing pain, stiffness, and swelling  If directed, put ice on the painful area. To do this: Put ice in a plastic bag, or use a frozen bottle of water. Place a towel between your skin and the bag or bottle. Roll the bottom of your foot over the bag or bottle. Do this for 20 minutes, 2-3 times a day. Wear athletic shoes that have air-sole or gel-sole cushions, or try soft shoe inserts that are designed for plantar fasciitis. Elevate your foot above the level of your heart while you are sitting or lying down. Activity Avoid activities that cause pain. Ask your health care provider what activities are safe for you. Do physical therapy exercises and stretches as told by your health care provider. Try activities and forms of exercise that are easier on your joints (low impact). Examples include swimming, water aerobics, and biking. General instructions Take over-the-counter   and prescription medicines only as told by your health care provider. Wear a night splint while sleeping, if told by your health care provider. Loosen the splint if your toes tingle, become numb, or turn cold and blue. Maintain a  healthy weight, or work with your health care provider to lose weight as needed. Keep all follow-up visits. This is important. Contact a health care provider if you have: Symptoms that do not go away with home treatment. Pain that gets worse. Pain that affects your ability to move or do daily activities. Summary Plantar fasciitis is a painful foot condition that affects the heel. It occurs when the band of tissue that connects the toes to the heel bone (plantar fascia) becomes irritated. Heel pain is the main symptom of this condition. It may get worse after exercising too much or standing still for a long time. Treatment varies, but it usually starts with rest, ice, pressure (compression), and raising (elevating) the affected foot. This is called RICE therapy. Over-the-counter medicines can also be used to manage pain. This information is not intended to replace advice given to you by your health care provider. Make sure you discuss any questions you have with your health care provider. Document Revised: 06/24/2019 Document Reviewed: 06/24/2019 Elsevier Patient Education  2023 Elsevier Inc.  

## 2022-02-01 LAB — ANA: Anti Nuclear Antibody (ANA): NEGATIVE

## 2022-02-01 LAB — C3 AND C4
C3 Complement: 132 mg/dL (ref 83–193)
C4 Complement: 34 mg/dL (ref 15–57)

## 2022-02-01 LAB — CBC WITH DIFFERENTIAL/PLATELET
Absolute Monocytes: 728 cells/uL (ref 200–950)
Basophils Absolute: 72 cells/uL (ref 0–200)
Basophils Relative: 0.9 %
Eosinophils Absolute: 136 cells/uL (ref 15–500)
Eosinophils Relative: 1.7 %
HCT: 36.9 % (ref 35.0–45.0)
Hemoglobin: 11.9 g/dL (ref 11.7–15.5)
Lymphs Abs: 4208 cells/uL — ABNORMAL HIGH (ref 850–3900)
MCH: 26.3 pg — ABNORMAL LOW (ref 27.0–33.0)
MCHC: 32.2 g/dL (ref 32.0–36.0)
MCV: 81.6 fL (ref 80.0–100.0)
MPV: 13 fL — ABNORMAL HIGH (ref 7.5–12.5)
Monocytes Relative: 9.1 %
Neutro Abs: 2856 cells/uL (ref 1500–7800)
Neutrophils Relative %: 35.7 %
Platelets: 228 10*3/uL (ref 140–400)
RBC: 4.52 10*6/uL (ref 3.80–5.10)
RDW: 12.9 % (ref 11.0–15.0)
Total Lymphocyte: 52.6 %
WBC: 8 10*3/uL (ref 3.8–10.8)

## 2022-02-01 LAB — COMPLETE METABOLIC PANEL WITH GFR
AG Ratio: 1.2 (calc) (ref 1.0–2.5)
ALT: 9 U/L (ref 6–29)
AST: 13 U/L (ref 10–30)
Albumin: 4 g/dL (ref 3.6–5.1)
Alkaline phosphatase (APISO): 64 U/L (ref 31–125)
BUN: 10 mg/dL (ref 7–25)
CO2: 24 mmol/L (ref 20–32)
Calcium: 9 mg/dL (ref 8.6–10.2)
Chloride: 107 mmol/L (ref 98–110)
Creat: 0.76 mg/dL (ref 0.50–0.97)
Globulin: 3.3 g/dL (calc) (ref 1.9–3.7)
Glucose, Bld: 31 mg/dL — CL (ref 65–139)
Potassium: 3.5 mmol/L (ref 3.5–5.3)
Sodium: 141 mmol/L (ref 135–146)
Total Bilirubin: 0.5 mg/dL (ref 0.2–1.2)
Total Protein: 7.3 g/dL (ref 6.1–8.1)
eGFR: 108 mL/min/{1.73_m2} (ref >=60)

## 2022-02-01 LAB — ANTI-DNA ANTIBODY, DOUBLE-STRANDED: ds DNA Ab: <1 IU/mL

## 2022-02-01 LAB — SEDIMENTATION RATE: Sed Rate: 25 mm/h — ABNORMAL HIGH (ref 0–20)

## 2022-02-01 NOTE — Progress Notes (Signed)
Glucose was 31 yesterday afternoon.  Please notify the patient.  Please clarify if the patient is feeling ok? Please also clarify if she had just taken her insulin?

## 2022-02-01 NOTE — Progress Notes (Signed)
ANA remains negative.

## 2022-02-01 NOTE — Progress Notes (Signed)
Complements WNL.  ESR is borderline elevated-25-trending down/improving. CBC stable.

## 2022-02-07 ENCOUNTER — Encounter: Payer: Self-pay | Admitting: Hematology and Oncology

## 2022-02-08 LAB — PROTEIN / CREATININE RATIO, URINE
Creatinine, Urine: 43 mg/dL (ref 20–275)
Protein/Creat Ratio: 628 mg/g creat — ABNORMAL HIGH (ref 24–184)
Protein/Creatinine Ratio: 0.628 mg/mg creat — ABNORMAL HIGH (ref 0.024–0.184)
Total Protein, Urine: 27 mg/dL — ABNORMAL HIGH (ref 5–24)

## 2022-02-08 NOTE — Progress Notes (Signed)
Double-stranded DNA is negative.

## 2022-03-17 ENCOUNTER — Telehealth: Payer: Self-pay

## 2022-03-17 NOTE — Telephone Encounter (Signed)
Patient reports change in skin color since Saturday of Left breast around the nipple and areola area. She states what was once darker and pink or tan is see through and lighter matching the skin around it. Would like to know if this is related to Lupus.   Does not have OBGYN but does have a PCP. Wanted to start here to see if this was normal for Lupus to have skin changes before going to a doctor about this.

## 2022-03-17 NOTE — Telephone Encounter (Signed)
Patient does not have a diagnosis of lupus.  It is unlikely this is related to autoimmune disease but I would recommend further evaluation.   I would recommend seeing her PCP first.

## 2022-04-18 ENCOUNTER — Other Ambulatory Visit: Payer: Self-pay | Admitting: Physician Assistant

## 2022-04-18 DIAGNOSIS — M47816 Spondylosis without myelopathy or radiculopathy, lumbar region: Secondary | ICD-10-CM

## 2022-04-26 NOTE — Progress Notes (Deleted)
Office Visit Note  Patient: [name]             Date of Birth: 1991-02-09           MRN: [id number]             PCP: Jonathon Jordan, MD Referring: Jonathon Jordan, MD Visit Date: 05/09/2022 Occupation: @GUAROCC$ @  Subjective:    History of Present Illness: [name] is a 32 y.o. female with history of seronegative rheumatoid arthritis.  Patient is currently taking Plaquenil 200 mg 1 tablet by mouth twice daily.    PLQ eye exam: 08/31/2021 WNL @ [facility]. Follow up in 1 year.  CBC and CMP were drawn on 01/31/2022.   Activities of Daily Living:  Patient reports morning stiffness for *** {minute/hour:19697}.   Patient {ACTIONS;DENIES/REPORTS:21021675::"Denies"} nocturnal pain.  Difficulty dressing/grooming: {ACTIONS;DENIES/REPORTS:21021675::"Denies"} Difficulty climbing stairs: {ACTIONS;DENIES/REPORTS:21021675::"Denies"} Difficulty getting out of chair: {ACTIONS;DENIES/REPORTS:21021675::"Denies"} Difficulty using hands for taps, buttons, cutlery, and/or writing: {ACTIONS;DENIES/REPORTS:21021675::"Denies"}  No Rheumatology ROS completed.   PMFS History:  Patient Active Problem List   Diagnosis Date Noted   IDA (iron deficiency anemia) 11/06/2020   Intractable abdominal pain 07/26/2020   Right upper quadrant abdominal pain 07/26/2020   Acute pyelonephritis 07/26/2020   Diabetes mellitus type 1, controlled, with complications ([place]) 123456   Obesity, Class III, BMI 40-49.9 (morbid obesity) ([place]) 07/26/2020   Enterococcus faecalis infection 07/26/2020   Essential hypertension 07/26/2020   RUQ abdominal pain 07/26/2020   Chronic low back pain 11/22/2013   Clinical depression 10/07/2013   Extreme obesity 10/07/2013   Diabetic neuropathy ([place]) 08/06/2013   General patient noncompliance 12/21/2012   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[place]) 12/30/2010   Appetite disorder 12/30/2010   BP (high blood pressure) 12/30/2010   Muscle ache 12/30/2010   Cachectic ([place])  12/30/2010   Patellofemoral disorder of left knee 09/03/2010   Diabetes mellitus 09/03/2010    Past Medical History:  Diagnosis Date   Anemia    Anxiety    Arthritis    Asthma    asthma as a child, no problems as an adult   Diabetes mellitus without complication ([place])    type1 - insulin pump   Dysrhythmia    GERD (gastroesophageal reflux disease)    Headache    HLD (hyperlipidemia)    diet controlled   Hypertension    no meds   Neuromuscular disorder ([place]) 2015   periph. neuropathy- no current problems   Vitamin D deficiency     Family History  Problem Relation Age of Onset   Lupus Maternal Grandmother    Asthma Sister    ADD / ADHD Sister    Past Surgical History:  Procedure Laterality Date   CATARACT EXTRACTION, BILATERAL Bilateral 2022   CHOLECYSTECTOMY N/A 11/11/2021   Procedure: LAPAROSCOPIC CHOLECYSTECTOMY;  Surgeon: Ralene Ok, MD;  Location: [facility];  Service: General;  Laterality: N/A;   GASTRIC BYPASS  07/2017   WISDOM TOOTH EXTRACTION     Social History   Social History Narrative   Not on file   Immunization History  Administered Date(s) Administered   Influenza Inj Mdck Quad Pf 02/11/2018   PFIZER(Purple Top)SARS-COV-2 Vaccination 05/28/2019, 06/18/2019, 03/06/2020, 09/13/2020   Pneumococcal Polysaccharide-23 10/07/2013     Objective: Vital Signs: There were no vitals taken for this visit.   Physical Exam Vitals and nursing note reviewed.  Constitutional:      Appearance: She is well-developed.  HENT:     Head: Normocephalic and atraumatic.  Eyes:  Conjunctiva/sclera: Conjunctivae normal.  Cardiovascular:     Rate and Rhythm: Normal rate and regular rhythm.     Heart sounds: Normal heart sounds.  Pulmonary:     Effort: Pulmonary effort is normal.     Breath sounds: Normal breath sounds.  Abdominal:     General: Bowel sounds are normal.     Palpations: Abdomen is soft.  Musculoskeletal:     Cervical back: Normal range of motion.   Skin:    General: Skin is warm and dry.     Capillary Refill: Capillary refill takes less than 2 seconds.  Neurological:     Mental Status: She is alert and oriented to person, place, and time.  Psychiatric:        Behavior: Behavior normal.      Musculoskeletal Exam: ***  CDAI Exam: CDAI Score: -- Patient Global: --; Provider Global: -- Swollen: --; Tender: -- Joint Exam 05/09/2022   No joint exam has been documented for this visit   There is currently no information documented on the homunculus. Go to the Rheumatology activity and complete the homunculus joint exam.  Investigation: No additional findings.  Imaging: No results found.  Recent Labs: Lab Results  Component Value Date   WBC 8.0 01/31/2022   HGB 11.9 01/31/2022   PLT 228 01/31/2022   NA 141 01/31/2022   K 3.5 01/31/2022   CL 107 01/31/2022   CO2 24 01/31/2022   GLUCOSE 31 (LL) 01/31/2022   BUN 10 01/31/2022   CREATININE 0.76 01/31/2022   BILITOT 0.5 01/31/2022   ALKPHOS 63 08/20/2021   AST 13 01/31/2022   ALT 9 01/31/2022   PROT 7.3 01/31/2022   ALBUMIN 3.3 (L) 08/20/2021   CALCIUM 9.0 01/31/2022   GFRAA 139 04/17/2020    Speciality Comments: PLQ eye exam: 08/31/2021 WNL @ [facility]. Follow up in 1 year.  Procedures:  No procedures performed Allergies: Fruit extracts, Latex, and Zinc   Assessment / Plan:     Visit Diagnoses: No diagnosis found.  Orders: No orders of the defined types were placed in this encounter.  No orders of the defined types were placed in this encounter.   Face-to-face time spent with patient was *** minutes. Greater than 50% of time was spent in counseling and coordination of care.  Follow-Up Instructions: No follow-ups on file.   Earnestine Mealing, CMA  Note - This record has been created using Editor, commissioning.  Chart creation errors have been sought, but may not always  have been located. Such creation errors do not reflect on  the  standard of medical care.

## 2022-05-01 ENCOUNTER — Encounter: Payer: Self-pay | Admitting: Hematology and Oncology

## 2022-05-08 ENCOUNTER — Ambulatory Visit
Admission: RE | Admit: 2022-05-08 | Discharge: 2022-05-08 | Disposition: A | Discharge status: Home / Self Care | Payer: PRIVATE HEALTH INSURANCE | Source: Ambulatory Visit | Attending: Physician Assistant | Admitting: Physician Assistant

## 2022-05-08 DIAGNOSIS — M47816 Spondylosis without myelopathy or radiculopathy, lumbar region: Secondary | ICD-10-CM

## 2022-05-09 ENCOUNTER — Ambulatory Visit: Payer: No Typology Code available for payment source | Admitting: Physician Assistant

## 2022-05-09 DIAGNOSIS — M222X2 Patellofemoral disorders, left knee: Secondary | ICD-10-CM

## 2022-05-09 DIAGNOSIS — M0600 Rheumatoid arthritis without rheumatoid factor, unspecified site: Secondary | ICD-10-CM

## 2022-05-09 DIAGNOSIS — M4306 Spondylolysis, lumbar region: Secondary | ICD-10-CM

## 2022-05-09 DIAGNOSIS — R768 Other specified abnormal immunological findings in serum: Secondary | ICD-10-CM

## 2022-05-09 DIAGNOSIS — G894 Chronic pain syndrome: Secondary | ICD-10-CM

## 2022-05-09 DIAGNOSIS — M722 Plantar fascial fibromatosis: Secondary | ICD-10-CM

## 2022-05-09 DIAGNOSIS — E538 Deficiency of other specified B group vitamins: Secondary | ICD-10-CM

## 2022-05-09 DIAGNOSIS — Z8269 Family history of other diseases of the musculoskeletal system and connective tissue: Secondary | ICD-10-CM

## 2022-05-09 DIAGNOSIS — M79642 Pain in left hand: Secondary | ICD-10-CM

## 2022-05-09 DIAGNOSIS — M7918 Myalgia, other site: Secondary | ICD-10-CM

## 2022-05-09 DIAGNOSIS — G5601 Carpal tunnel syndrome, right upper limb: Secondary | ICD-10-CM

## 2022-05-09 DIAGNOSIS — Z8659 Personal history of other mental and behavioral disorders: Secondary | ICD-10-CM

## 2022-05-09 DIAGNOSIS — L2089 Other atopic dermatitis: Secondary | ICD-10-CM

## 2022-05-09 DIAGNOSIS — Z862 Personal history of diseases of the blood and blood-forming organs and certain disorders involving the immune mechanism: Secondary | ICD-10-CM

## 2022-05-09 DIAGNOSIS — E1143 Type 2 diabetes mellitus with diabetic autonomic (poly)neuropathy: Secondary | ICD-10-CM

## 2022-05-09 DIAGNOSIS — Z79899 Other long term (current) drug therapy: Secondary | ICD-10-CM

## 2022-05-09 DIAGNOSIS — G8929 Other chronic pain: Secondary | ICD-10-CM

## 2022-05-09 DIAGNOSIS — Z8709 Personal history of other diseases of the respiratory system: Secondary | ICD-10-CM

## 2022-05-09 DIAGNOSIS — E1042 Type 1 diabetes mellitus with diabetic polyneuropathy: Secondary | ICD-10-CM

## 2022-05-09 DIAGNOSIS — Z8719 Personal history of other diseases of the digestive system: Secondary | ICD-10-CM

## 2022-05-09 DIAGNOSIS — E559 Vitamin D deficiency, unspecified: Secondary | ICD-10-CM

## 2022-05-09 DIAGNOSIS — M62838 Other muscle spasm: Secondary | ICD-10-CM

## 2022-05-09 DIAGNOSIS — R5383 Other fatigue: Secondary | ICD-10-CM

## 2022-05-10 NOTE — Progress Notes (Deleted)
Office Visit Note  Patient: [name]             Date of Birth: [birth year]           MRN: [id number]             PCP: Jonathon Jordan, MD Referring: Jonathon Jordan, MD Visit Date: 05/19/2022 Occupation: @GUAROCC$ @  Subjective:  No chief complaint on file.   History of Present Illness: [name] is a 32 y.o. female ***     Activities of Daily Living:  Patient reports morning stiffness for *** {minute/hour:19697}.   Patient {ACTIONS;DENIES/REPORTS:21021675::"Denies"} nocturnal pain.  Difficulty dressing/grooming: {ACTIONS;DENIES/REPORTS:21021675::"Denies"} Difficulty climbing stairs: {ACTIONS;DENIES/REPORTS:21021675::"Denies"} Difficulty getting out of chair: {ACTIONS;DENIES/REPORTS:21021675::"Denies"} Difficulty using hands for taps, buttons, cutlery, and/or writing: {ACTIONS;DENIES/REPORTS:21021675::"Denies"}  No Rheumatology ROS completed.   PMFS History:  Patient Active Problem List   Diagnosis Date Noted   IDA (iron deficiency anemia) 11/06/2020   Intractable abdominal pain 07/26/2020   Right upper quadrant abdominal pain 07/26/2020   Acute pyelonephritis 07/26/2020   Diabetes mellitus type 1, controlled, with complications ([place]) 123456   Obesity, Class III, BMI 40-49.9 (morbid obesity) ([place]) 07/26/2020   Enterococcus faecalis infection 07/26/2020   Essential hypertension 07/26/2020   RUQ abdominal pain 07/26/2020   Chronic low back pain 11/22/2013   Clinical depression 10/07/2013   Extreme obesity 10/07/2013   Diabetic neuropathy ([place]) 08/06/2013   General patient noncompliance 12/21/2012   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[place]) 12/30/2010   Appetite disorder 12/30/2010   BP (high blood pressure) 12/30/2010   Muscle ache 12/30/2010   Cachectic ([place]) 12/30/2010   Patellofemoral disorder of left knee 09/03/2010   Diabetes mellitus 09/03/2010    Past Medical History:  Diagnosis Date   Anemia    Anxiety    Arthritis    Asthma    asthma as a child, no  problems as an adult   Diabetes mellitus without complication ([place])    type1 - insulin pump   Dysrhythmia    GERD (gastroesophageal reflux disease)    Headache    HLD (hyperlipidemia)    diet controlled   Hypertension    no meds   Neuromuscular disorder ([place]) 2015   periph. neuropathy- no current problems   Vitamin D deficiency     Family History  Problem Relation Age of Onset   Lupus Maternal Grandmother    Asthma Sister    ADD / ADHD Sister    Past Surgical History:  Procedure Laterality Date   CATARACT EXTRACTION, BILATERAL Bilateral 2022   CHOLECYSTECTOMY N/A 11/11/2021   Procedure: LAPAROSCOPIC CHOLECYSTECTOMY;  Surgeon: Ralene Ok, MD;  Location: [facility];  Service: General;  Laterality: N/A;   GASTRIC BYPASS  07/2017   WISDOM TOOTH EXTRACTION     Social History   Social History Narrative   Not on file   Immunization History  Administered Date(s) Administered   Influenza Inj Mdck Quad Pf 02/11/2018   PFIZER(Purple Top)SARS-COV-2 Vaccination 05/28/2019, 06/18/2019, 03/06/2020, 09/13/2020   Pneumococcal Polysaccharide-23 10/07/2013     Objective: Vital Signs: There were no vitals taken for this visit.   Physical Exam   Musculoskeletal Exam: ***  CDAI Exam: CDAI Score: -- Patient Global: --; Provider Global: -- Swollen: --; Tender: -- Joint Exam 05/19/2022   No joint exam has been documented for this visit   There is currently no information documented on the homunculus. Go to the Rheumatology activity and complete the homunculus joint exam.  Investigation: No additional findings.  Imaging: MR LUMBAR SPINE  WO CONTRAST  Result Date: 05/09/2022 CLINICAL DATA:  Spondylosis of lumbar region without myelopathy or radiculopathy. Chronic low back pain. EXAM: MRI LUMBAR SPINE WITHOUT CONTRAST TECHNIQUE: Multiplanar, multisequence MR imaging of the lumbar spine was performed. No intravenous contrast was administered. COMPARISON:  Lumbar spine radiographs  12/06/2018 FINDINGS: Segmentation:  Standard. Alignment:  Normal. Vertebrae: No fracture, suspicious marrow lesion, or significant marrow edema. Conus medullaris and cauda equina: Conus extends to the L1-2 level. Conus and cauda equina appear normal. Paraspinal and other soft tissues: Unremarkable. Disc levels: T12-L1 and L1-2: Negative. L2-3: Trace disc bulging without stenosis. L3-4: Mild disc bulging results in borderline right neural foraminal stenosis without spinal stenosis. L4-5: Mild facet hypertrophy and at most minimal disc bulging without stenosis. L5-S1: Minimal disc bulging and mild facet hypertrophy without stenosis. IMPRESSION: Mild lumbar spondylosis and facet hypertrophy without evidence of neural impingement. Electronically Signed   By: Logan Bores M.D.   On: 05/09/2022 19:17    Recent Labs: Lab Results  Component Value Date   WBC 8.0 01/31/2022   HGB 11.9 01/31/2022   PLT 228 01/31/2022   NA 141 01/31/2022   K 3.5 01/31/2022   CL 107 01/31/2022   CO2 24 01/31/2022   GLUCOSE 31 (LL) 01/31/2022   BUN 10 01/31/2022   CREATININE 0.76 01/31/2022   BILITOT 0.5 01/31/2022   ALKPHOS 63 08/20/2021   AST 13 01/31/2022   ALT 9 01/31/2022   PROT 7.3 01/31/2022   ALBUMIN 3.3 (L) 08/20/2021   CALCIUM 9.0 01/31/2022   GFRAA 139 04/17/2020    Speciality Comments: PLQ eye exam: 08/31/2021 WNL @ [facility]. Follow up in 1 year.  Procedures:  No procedures performed Allergies: Fruit extracts, Latex, and Zinc   Assessment / Plan:     Visit Diagnoses: Seronegative rheumatoid arthritis ([place])  High risk medication use  Carpal tunnel syndrome, right upper limb  Chronic pain of right knee  Patellofemoral disorder of left knee  Chronic SI joint pain  Spondylolysis, lumbar region  Positive ANA (antinuclear antibody)  Family history of systemic lupus erythematosus  Trapezius muscle spasm  Myofascial pain  Other fatigue  Chronic pain syndrome  History of  iron deficiency anemia  Vitamin B12 deficiency  Vitamin D deficiency  History of depression  History of gastroesophageal reflux (GERD)  Type 1 diabetes mellitus with diabetic polyneuropathy ([place])  History of asthma  Diabetic gastroparesis ([place])  Orders: No orders of the defined types were placed in this encounter.  No orders of the defined types were placed in this encounter.   Face-to-face time spent with patient was *** minutes. Greater than 50% of time was spent in counseling and coordination of care.  Follow-Up Instructions: No follow-ups on file.   Ofilia Neas, PA-C  Note - This record has been created using Dragon software.  Chart creation errors have been sought, but may not always  have been located. Such creation errors do not reflect on  the standard of medical care.

## 2022-05-18 NOTE — Progress Notes (Unsigned)
Office Visit Note  Patient: [name]             Date of Birth: [birth year]           MRN: [id number]             PCP: Jonathon Jordan, MD Referring: Jonathon Jordan, MD Visit Date: 05/19/2022 Occupation: '@GUAROCC'$ @  Subjective:  Frequent flares   History of Present Illness: [name] is a 32 y.o. female with history of seronegative rheumatoid arthritis.  Patient remains on plaquenil 200 mg 1 tablet by mouth twice daily.  She has been tolerating plaquenil without any side effects.  She reports that she has been having more frequent and severe rheumatoid arthritis flares.  She states that she has had intermittent bouts of swelling in her right hand and right wrist joint.  She is also been having increased discomfort in her right ankle joint.  She has not missed any doses of Plaquenil recently.  She continues to take hydrocodone as prescribed by pain management.  She has ongoing discomfort in her lower back.  She had an updated MRI of the lumbar spine on 05/08/22.  She has been following up closely at [facility].  Patient reports that her proteinuria has started to improve.  She states that she has noticed 2 episodes of periorbital swelling when she woke up first thing in the morning.  She remains concerned for underlying autoimmune disease given her family history of systemic lupus.  She states that her mother is currently undergoing workup for autoimmune disease as well.    Activities of Daily Living:  Patient reports morning stiffness for 30-60 minutes.   Patient Reports nocturnal pain.  Difficulty dressing/grooming: Denies Difficulty climbing stairs: Denies Difficulty getting out of chair: Denies Difficulty using hands for taps, buttons, cutlery, and/or writing: Reports  Review of Systems  Constitutional:  Positive for fatigue.  HENT:  Positive for mouth dryness. Negative for mouth sores.   Eyes: Negative.  Negative for dryness.  Respiratory: Negative.  Negative for  shortness of breath.   Cardiovascular: Negative.  Negative for chest pain and palpitations.  Gastrointestinal: Negative.  Negative for blood in stool, constipation and diarrhea.  Endocrine: Negative.  Negative for increased urination.  Genitourinary: Negative.  Negative for involuntary urination.  Musculoskeletal:  Positive for joint pain, joint pain, joint swelling, myalgias, morning stiffness, muscle tenderness and myalgias. Negative for gait problem and muscle weakness.  Skin:  Positive for hair loss. Negative for color change, rash and sensitivity to sunlight.  Allergic/Immunologic: Negative for susceptible to infections.  Neurological:  Positive for headaches. Negative for dizziness.  Hematological:  Positive for swollen glands.  Psychiatric/Behavioral: Negative.  Negative for depressed mood and sleep disturbance. The patient is not nervous/anxious.     PMFS History:  Patient Active Problem List   Diagnosis Date Noted   IDA (iron deficiency anemia) 11/06/2020   Intractable abdominal pain 07/26/2020   Right upper quadrant abdominal pain 07/26/2020   Acute pyelonephritis 07/26/2020   Diabetes mellitus type 1, controlled, with complications ([place]) 123456   Obesity, Class III, BMI 40-49.9 (morbid obesity) ([place]) 07/26/2020   Enterococcus faecalis infection 07/26/2020   Essential hypertension 07/26/2020   RUQ abdominal pain 07/26/2020   Chronic low back pain 11/22/2013   Clinical depression 10/07/2013   Extreme obesity 10/07/2013   Diabetic neuropathy ([place]) 08/06/2013   General patient noncompliance 12/21/2012   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[place]) 12/30/2010   Appetite disorder 12/30/2010   BP (high blood pressure)  12/30/2010   Muscle ache 12/30/2010   Cachectic ([place]) 12/30/2010   Patellofemoral disorder of left knee 09/03/2010   Diabetes mellitus 09/03/2010    Past Medical History:  Diagnosis Date   Anemia    Anxiety    Arthritis    Asthma    asthma as a child, no problems as an  adult   Diabetes mellitus without complication ([place])    type1 - insulin pump   Dysrhythmia    GERD (gastroesophageal reflux disease)    Headache    HLD (hyperlipidemia)    diet controlled   Hypertension    no meds   Neuromuscular disorder ([place]) 2015   periph. neuropathy- no current problems   Vitamin D deficiency     Family History  Problem Relation Age of Onset   Lupus Maternal Grandmother    Asthma Sister    ADD / ADHD Sister    Past Surgical History:  Procedure Laterality Date   CATARACT EXTRACTION, BILATERAL Bilateral 2022   CHOLECYSTECTOMY N/A 11/11/2021   Procedure: LAPAROSCOPIC CHOLECYSTECTOMY;  Surgeon: Ralene Ok, MD;  Location: [facility];  Service: General;  Laterality: N/A;   GASTRIC BYPASS  07/2017   WISDOM TOOTH EXTRACTION     Social History   Social History Narrative   Not on file   Immunization History  Administered Date(s) Administered   Influenza Inj Mdck Quad Pf 02/11/2018   PFIZER(Purple Top)SARS-COV-2 Vaccination 05/28/2019, 06/18/2019, 03/06/2020, 09/13/2020   Pneumococcal Polysaccharide-23 10/07/2013     Objective: Vital Signs: BP (!) 173/118 (BP Location: Left Arm, Patient Position: Sitting, Cuff Size: Large)   Pulse 90   Resp 16   Ht 5' 4.75" (1.645 m)   Wt 268 lb 3.2 oz (121.7 kg)   BMI 44.98 kg/m    Physical Exam Vitals and nursing note reviewed.  Constitutional:      Appearance: She is well-developed.  HENT:     Head: Normocephalic and atraumatic.  Eyes:     Conjunctiva/sclera: Conjunctivae normal.  Cardiovascular:     Rate and Rhythm: Normal rate and regular rhythm.     Heart sounds: Normal heart sounds.  Pulmonary:     Effort: Pulmonary effort is normal.     Breath sounds: Normal breath sounds.  Abdominal:     General: Bowel sounds are normal.     Palpations: Abdomen is soft.  Musculoskeletal:     Cervical back: Normal range of motion.  Lymphadenopathy:     Cervical: No cervical adenopathy.  Skin:    General: Skin is  warm and dry.     Capillary Refill: Capillary refill takes less than 2 seconds.  Neurological:     Mental Status: She is alert and oriented to person, place, and time.  Psychiatric:        Behavior: Behavior normal.      Musculoskeletal Exam: C-spine has good ROM. Painful ROM of lumbar spine.  Shoulder joints, elbow joints, wrist joints, MCPs, PIPs, and DIPs good ROM with no synovitis.  Complete fist formation bilaterally. Tenderness of the right wrist. Tenderness over the right 3rd and 4th MCP and PIP joints.  Hip joints, knee joints, and ankle joints have good ROM with no discomfort.  No warmth or effusion of knee joints. Tenderness of the right ankle joint.  Pedal edema noted bilaterally.   CDAI Exam: CDAI Score: 6.1  Patient Global: 7 mm; Provider Global: 4 mm Swollen: 0 ; Tender: 6  Joint Exam 05/19/2022      Right  Left  Wrist   Tender     MCP 3   Tender     MCP 4   Tender     PIP 3   Tender     PIP 4   Tender     Ankle   Tender        Investigation: No additional findings.  Imaging: MR LUMBAR SPINE WO CONTRAST  Result Date: 05/09/2022 CLINICAL DATA:  Spondylosis of lumbar region without myelopathy or radiculopathy. Chronic low back pain. EXAM: MRI LUMBAR SPINE WITHOUT CONTRAST TECHNIQUE: Multiplanar, multisequence MR imaging of the lumbar spine was performed. No intravenous contrast was administered. COMPARISON:  Lumbar spine radiographs 12/06/2018 FINDINGS: Segmentation:  Standard. Alignment:  Normal. Vertebrae: No fracture, suspicious marrow lesion, or significant marrow edema. Conus medullaris and cauda equina: Conus extends to the L1-2 level. Conus and cauda equina appear normal. Paraspinal and other soft tissues: Unremarkable. Disc levels: T12-L1 and L1-2: Negative. L2-3: Trace disc bulging without stenosis. L3-4: Mild disc bulging results in borderline right neural foraminal stenosis without spinal stenosis. L4-5: Mild facet hypertrophy and at most minimal disc bulging  without stenosis. L5-S1: Minimal disc bulging and mild facet hypertrophy without stenosis. IMPRESSION: Mild lumbar spondylosis and facet hypertrophy without evidence of neural impingement. Electronically Signed   By: Logan Bores M.D.   On: 05/09/2022 19:17    Recent Labs: Lab Results  Component Value Date   WBC 8.0 01/31/2022   HGB 11.9 01/31/2022   PLT 228 01/31/2022   NA 141 01/31/2022   K 3.5 01/31/2022   CL 107 01/31/2022   CO2 24 01/31/2022   GLUCOSE 31 (LL) 01/31/2022   BUN 10 01/31/2022   CREATININE 0.76 01/31/2022   BILITOT 0.5 01/31/2022   ALKPHOS 63 08/20/2021   AST 13 01/31/2022   ALT 9 01/31/2022   PROT 7.3 01/31/2022   ALBUMIN 3.3 (L) 08/20/2021   CALCIUM 9.0 01/31/2022   GFRAA 139 04/17/2020    Speciality Comments: PLQ eye exam: 08/31/2021 WNL @ [facility]. Follow up in 1 year.  Procedures:  No procedures performed Allergies: Fruit extracts, Latex, and Zinc     Assessment / Plan:     Visit Diagnoses: Seronegative rheumatoid arthritis ([place]) - U/S+ syonvitis Bilateral wrists/left MCP, RF-, CCP-, 14-3-3 eta negative: Patient presents today with increased pain in multiple joints especially in her right wrist, right hand, and right ankle joint.  She has been is experiencing more frequent and severe rheumatoid arthritis flares.  She remains on Plaquenil 200 mg 1 tablet by mouth twice daily.  She has been tolerating Plaquenil without any side effects and has not missed any doses recently.  I do not see any obvious inflammation on examination today.  Discussed scheduling an ultrasound of both hands to assess for synovitis.  She was in agreement.  She will remain on Plaquenil as prescribed.  She will continue to follow-up with pain management and will take hydrocodone as needed for pain relief. She will follow up in the office in 3 months or sooner if needed.  - Plan: hydroxychloroquine (PLAQUENIL) 200 MG tablet, Sedimentation rate  High risk medication use -  Plaquenil 200 mg 1 tablet by mouth twice daily.  PLQ eye exam: 08/31/2021 WNL @ [facility]. Follow up in 1 year.  CBC and CMP updated on 01/31/22.  CBC and CMP released today. - Plan: COMPLETE METABOLIC PANEL WITH GFR, CBC with Differential/Platelet  Carpal tunnel syndrome, right upper limb: No paresthesias at this time.     Pain  in both hands - X-rays of both hands were unremarkable on 02/25/2021.  No radiographic progression was noted when compared to x-rays from 2020.  She presents today with increased pain in both hands, right hand > left.  She has tenderness over the right wrist and right third and fourth MCP and PIP joints.  No synovitis was noted today.  She remains on Plaquenil as prescribed.  Plan to update an ultrasound of both hands to assess for synovitis prior to making any medication changes.  Chronic pain of right knee: No warmth or effusion noted today.   Patellofemoral disorder of left knee: No warmth or effusion noted today.   Spondylolysis, lumbar region: Chronic pain.  Followed by pain management. Updated MRI of lumbar spine on 05/08/22: Mild lumbar spondylosis and facet hypertrophy without evidence of neural impingement.  He has been taking hydrocodone as needed for pain relief.  Chronic SI joint pain: Chronic pain. Followed by pain management.   Positive ANA (antinuclear antibody) - ANA positive 11/15/19. 06/17/2020 ANA negative. 01/31/22: ANA negative Lab work from 01/31/22 was reviewed today in the office: ANA negative, dsDNA negative, complements WNL.  There is very low suspicion for systemic lupus at this time.  She has a known family history of systemic lupus in her maternal grandmother which has concerned her over the years.  She has been following up with [facility] for monitoring of proteinuria.  According to the patient she has had 2 episodes of periorbital swelling first thing in the morning.  She has requested to have the following lab work updated  today.  - Plan: Protein / creatinine ratio, urine, Anti-DNA antibody, double-stranded, C3 and C4, Sedimentation rate, COMPLETE METABOLIC PANEL WITH GFR, CBC with Differential/Platelet, ANA, Anti-scleroderma antibody, RNP Antibody, Anti-Smith antibody, Sjogrens syndrome-A extractable nuclear antibody, Sjogrens syndrome-B extractable nuclear antibody  Family history of systemic lupus erythematosus -Maternal grandmother-The following lab work will be obtained today for further evaluation.   Plan: hydroxychloroquine (PLAQUENIL) 200 MG tablet, Protein / creatinine ratio, urine, Anti-DNA antibody, double-stranded, C3 and C4, Sedimentation rate, COMPLETE METABOLIC PANEL WITH GFR, CBC with Differential/Platelet, ANA, Anti-scleroderma antibody, RNP Antibody, Anti-Smith antibody, Sjogrens syndrome-A extractable nuclear antibody, Sjogrens syndrome-B extractable nuclear antibody  Myofascial pain: She continues to have generalized myalgias and muscle tenderness due to myofascial pain.  Trapezius muscle spasm: She has trapezius muscle tension tenderness bilaterally.  Other fatigue: Chronic.  The following lab work will be obtained today for further evaluation.  Chronic pain syndrome: Under the care of pain management.  She remains on hydrocodone as needed for pain relief.  Elevated blood pressure reading: Blood pressure was significantly elevated today in the office.  She was advised to reach out to her PCP today.  Other medical conditions are listed as follows:  History of iron deficiency anemia  Vitamin B12 deficiency  Vitamin D deficiency  History of depression  History of gastroesophageal reflux (GERD)  Type 1 diabetes mellitus with diabetic polyneuropathy (HCC)  Diabetic gastroparesis ([place])  History of asthma   Orders: Orders Placed This Encounter  Procedures   Protein / creatinine ratio, urine   Anti-DNA antibody, double-stranded   C3 and C4   Sedimentation rate   COMPLETE METABOLIC  PANEL WITH GFR   CBC with Differential/Platelet   ANA   Anti-scleroderma antibody   RNP Antibody   Anti-Smith antibody   Sjogrens syndrome-A extractable nuclear antibody   Sjogrens syndrome-B extractable nuclear antibody   Meds ordered this encounter  Medications   hydroxychloroquine (PLAQUENIL)  200 MG tablet    Sig: TAKE 1 TABLET(200 MG) BY MOUTH TWICE DAILY    Dispense:  180 tablet    Refill:  0     Follow-Up Instructions: Return in about 3 months (around 08/17/2022) for Rheumatoid arthritis.   Ofilia Neas, PA-C  Note - This record has been created using Dragon software.  Chart creation errors have been sought, but may not always  have been located. Such creation errors do not reflect on  the standard of medical care.

## 2022-05-19 ENCOUNTER — Ambulatory Visit: Payer: No Typology Code available for payment source | Attending: Physician Assistant | Admitting: Physician Assistant

## 2022-05-19 ENCOUNTER — Ambulatory Visit: Payer: No Typology Code available for payment source | Admitting: Physician Assistant

## 2022-05-19 ENCOUNTER — Encounter: Payer: Self-pay | Admitting: Physician Assistant

## 2022-05-19 VITALS — BP 173/118 | HR 90 | Resp 16 | Ht 64.75 in | Wt 268.2 lb

## 2022-05-19 DIAGNOSIS — M62838 Other muscle spasm: Secondary | ICD-10-CM

## 2022-05-19 DIAGNOSIS — Z8669 Personal history of other diseases of the nervous system and sense organs: Secondary | ICD-10-CM

## 2022-05-19 DIAGNOSIS — M7918 Myalgia, other site: Secondary | ICD-10-CM

## 2022-05-19 DIAGNOSIS — E1042 Type 1 diabetes mellitus with diabetic polyneuropathy: Secondary | ICD-10-CM

## 2022-05-19 DIAGNOSIS — M79641 Pain in right hand: Secondary | ICD-10-CM

## 2022-05-19 DIAGNOSIS — Z79899 Other long term (current) drug therapy: Secondary | ICD-10-CM

## 2022-05-19 DIAGNOSIS — R768 Other specified abnormal immunological findings in serum: Secondary | ICD-10-CM

## 2022-05-19 DIAGNOSIS — G894 Chronic pain syndrome: Secondary | ICD-10-CM

## 2022-05-19 DIAGNOSIS — Z8659 Personal history of other mental and behavioral disorders: Secondary | ICD-10-CM

## 2022-05-19 DIAGNOSIS — G5601 Carpal tunnel syndrome, right upper limb: Secondary | ICD-10-CM

## 2022-05-19 DIAGNOSIS — M25571 Pain in right ankle and joints of right foot: Secondary | ICD-10-CM

## 2022-05-19 DIAGNOSIS — K3184 Gastroparesis: Secondary | ICD-10-CM

## 2022-05-19 DIAGNOSIS — M25531 Pain in right wrist: Secondary | ICD-10-CM

## 2022-05-19 DIAGNOSIS — M0600 Rheumatoid arthritis without rheumatoid factor, unspecified site: Secondary | ICD-10-CM | POA: Diagnosis not present

## 2022-05-19 DIAGNOSIS — M4306 Spondylolysis, lumbar region: Secondary | ICD-10-CM

## 2022-05-19 DIAGNOSIS — Z862 Personal history of diseases of the blood and blood-forming organs and certain disorders involving the immune mechanism: Secondary | ICD-10-CM

## 2022-05-19 DIAGNOSIS — M533 Sacrococcygeal disorders, not elsewhere classified: Secondary | ICD-10-CM

## 2022-05-19 DIAGNOSIS — Z8269 Family history of other diseases of the musculoskeletal system and connective tissue: Secondary | ICD-10-CM

## 2022-05-19 DIAGNOSIS — E1143 Type 2 diabetes mellitus with diabetic autonomic (poly)neuropathy: Secondary | ICD-10-CM

## 2022-05-19 DIAGNOSIS — M222X2 Patellofemoral disorders, left knee: Secondary | ICD-10-CM

## 2022-05-19 DIAGNOSIS — R5383 Other fatigue: Secondary | ICD-10-CM

## 2022-05-19 DIAGNOSIS — M0609 Rheumatoid arthritis without rheumatoid factor, multiple sites: Secondary | ICD-10-CM | POA: Diagnosis not present

## 2022-05-19 DIAGNOSIS — M79642 Pain in left hand: Secondary | ICD-10-CM

## 2022-05-19 DIAGNOSIS — Z8719 Personal history of other diseases of the digestive system: Secondary | ICD-10-CM

## 2022-05-19 DIAGNOSIS — M25561 Pain in right knee: Secondary | ICD-10-CM

## 2022-05-19 DIAGNOSIS — Z8709 Personal history of other diseases of the respiratory system: Secondary | ICD-10-CM

## 2022-05-19 DIAGNOSIS — R030 Elevated blood-pressure reading, without diagnosis of hypertension: Secondary | ICD-10-CM

## 2022-05-19 DIAGNOSIS — E538 Deficiency of other specified B group vitamins: Secondary | ICD-10-CM

## 2022-05-19 DIAGNOSIS — G8929 Other chronic pain: Secondary | ICD-10-CM

## 2022-05-19 DIAGNOSIS — E559 Vitamin D deficiency, unspecified: Secondary | ICD-10-CM

## 2022-05-19 MED ORDER — HYDROXYCHLOROQUINE SULFATE 200 MG PO TABS: ORAL_TABLET | ORAL | 0 refills | Status: DC

## 2022-05-20 NOTE — Progress Notes (Signed)
Urine protein creatinine ratio is elevated.  Sed rate is mildly elevated and stable.  CMP is normal except low glucose and low calcium.  Hemoglobin is low at 11.0.  Complements are normal.  Other labs are pending.  Patient complained about periorbital swelling at the last visit per [facility]'s notes.  Please refer her to nephrology (urgent referral) for the evaluation of proteinuria.  Please forward the results to her PCP.

## 2022-05-21 LAB — COMPLETE METABOLIC PANEL WITH GFR
AG Ratio: 1.3 (calc) (ref 1.0–2.5)
ALT: 8 U/L (ref 6–29)
AST: 10 U/L (ref 10–30)
Albumin: 3.8 g/dL (ref 3.6–5.1)
Alkaline phosphatase (APISO): 59 U/L (ref 31–125)
BUN: 10 mg/dL (ref 7–25)
CO2: 25 mmol/L (ref 20–32)
Calcium: 8.4 mg/dL — ABNORMAL LOW (ref 8.6–10.2)
Chloride: 108 mmol/L (ref 98–110)
Creat: 0.76 mg/dL (ref 0.50–0.97)
Globulin: 3 g/dL (calc) (ref 1.9–3.7)
Glucose, Bld: 42 mg/dL — ABNORMAL LOW (ref 65–99)
Potassium: 3.7 mmol/L (ref 3.5–5.3)
Sodium: 141 mmol/L (ref 135–146)
Total Bilirubin: 0.4 mg/dL (ref 0.2–1.2)
Total Protein: 6.8 g/dL (ref 6.1–8.1)
eGFR: 108 mL/min/{1.73_m2} (ref >=60)

## 2022-05-21 LAB — PROTEIN / CREATININE RATIO, URINE
Creatinine, Urine: 118 mg/dL (ref 20–275)
Protein/Creat Ratio: 2254 mg/g creat — ABNORMAL HIGH (ref 24–184)
Protein/Creatinine Ratio: 2.254 mg/mg creat — ABNORMAL HIGH (ref 0.024–0.184)
Total Protein, Urine: 266 mg/dL — ABNORMAL HIGH (ref 5–24)

## 2022-05-21 LAB — CBC WITH DIFFERENTIAL/PLATELET
Absolute Monocytes: 738 cells/uL (ref 200–950)
Basophils Absolute: 62 cells/uL (ref 0–200)
Basophils Relative: 0.9 %
Eosinophils Absolute: 159 cells/uL (ref 15–500)
Eosinophils Relative: 2.3 %
HCT: 35 % (ref 35.0–45.0)
Hemoglobin: 11 g/dL — ABNORMAL LOW (ref 11.7–15.5)
Lymphs Abs: 3264 cells/uL (ref 850–3900)
MCH: 26.4 pg — ABNORMAL LOW (ref 27.0–33.0)
MCHC: 31.4 g/dL — ABNORMAL LOW (ref 32.0–36.0)
MCV: 84.1 fL (ref 80.0–100.0)
MPV: 12.6 fL — ABNORMAL HIGH (ref 7.5–12.5)
Monocytes Relative: 10.7 %
Neutro Abs: 2677 cells/uL (ref 1500–7800)
Neutrophils Relative %: 38.8 %
Platelets: 255 10*3/uL (ref 140–400)
RBC: 4.16 10*6/uL (ref 3.80–5.10)
RDW: 12.8 % (ref 11.0–15.0)
Total Lymphocyte: 47.3 %
WBC: 6.9 10*3/uL (ref 3.8–10.8)

## 2022-05-21 LAB — ANA: Anti Nuclear Antibody (ANA): NEGATIVE

## 2022-05-21 LAB — RNP ANTIBODY: Ribonucleic Protein(ENA) Antibody, IgG: <1 AI

## 2022-05-21 LAB — ANTI-SMITH ANTIBODY: ENA SM Ab Ser-aCnc: <1 AI

## 2022-05-21 LAB — SJOGRENS SYNDROME-B EXTRACTABLE NUCLEAR ANTIBODY: SSB (La) (ENA) Antibody, IgG: <1 AI

## 2022-05-21 LAB — SEDIMENTATION RATE: Sed Rate: 22 mm/h — ABNORMAL HIGH (ref 0–20)

## 2022-05-21 LAB — C3 AND C4
C3 Complement: 137 mg/dL (ref 83–193)
C4 Complement: 37 mg/dL (ref 15–57)

## 2022-05-21 LAB — ANTI-SCLERODERMA ANTIBODY: Scleroderma (Scl-70) (ENA) Antibody, IgG: <1 AI

## 2022-05-21 LAB — ANTI-DNA ANTIBODY, DOUBLE-STRANDED: ds DNA Ab: <1 IU/mL

## 2022-05-21 LAB — SJOGRENS SYNDROME-A EXTRACTABLE NUCLEAR ANTIBODY: SSA (Ro) (ENA) Antibody, IgG: <1 AI

## 2022-05-23 NOTE — Progress Notes (Signed)
Patient follows up at [facility].   Please forward lab results.  Protein creatinine ratio remains elevated and has trended up.

## 2022-06-02 ENCOUNTER — Ambulatory Visit: Payer: No Typology Code available for payment source | Admitting: Physician Assistant

## 2022-08-01 ENCOUNTER — Other Ambulatory Visit: Payer: No Typology Code available for payment source | Admitting: Rheumatology

## 2022-08-22 ENCOUNTER — Ambulatory Visit: Payer: No Typology Code available for payment source | Admitting: Physician Assistant

## 2022-09-09 NOTE — Progress Notes (Deleted)
Office Visit Note  Patient: [name]             Date of Birth: 04/02/1990           MRN: [id number]             PCP: [name], MD Referring: [name], MD Visit Date: 09/23/2022 Occupation: @GUAROCC @  Subjective:  No chief complaint on file.   History of Present Illness: [name] is a 32 y.o. female ***     Activities of Daily Living:  Patient reports morning stiffness for *** {minute/hour:19697}.   Patient {ACTIONS;DENIES/REPORTS:21021675::"Denies"} nocturnal pain.  Difficulty dressing/grooming: {ACTIONS;DENIES/REPORTS:21021675::"Denies"} Difficulty climbing stairs: {ACTIONS;DENIES/REPORTS:21021675::"Denies"} Difficulty getting out of chair: {ACTIONS;DENIES/REPORTS:21021675::"Denies"} Difficulty using hands for taps, buttons, cutlery, and/or writing: {ACTIONS;DENIES/REPORTS:21021675::"Denies"}  No Rheumatology ROS completed.   PMFS History:  Patient Active Problem List   Diagnosis Date Noted   IDA (iron deficiency anemia) 11/06/2020   Intractable abdominal pain 07/26/2020   Right upper quadrant abdominal pain 07/26/2020   Acute pyelonephritis 07/26/2020   Diabetes mellitus type 1, controlled, with complications (HCC) 07/26/2020   Obesity, Class III, BMI 40-49.9 (morbid obesity) (HCC) 07/26/2020   Enterococcus faecalis infection 07/26/2020   Essential hypertension 07/26/2020   RUQ abdominal pain 07/26/2020   Chronic low back pain 11/22/2013   Clinical depression 10/07/2013   Extreme obesity 10/07/2013   Diabetic neuropathy (HCC) 08/06/2013   General patient noncompliance 12/21/2012   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HCC) 12/30/2010   Appetite disorder 12/30/2010   BP (high blood pressure) 12/30/2010   Muscle ache 12/30/2010   Cachectic (HCC) 12/30/2010   Patellofemoral disorder of left knee 09/03/2010   Diabetes mellitus 09/03/2010    Past Medical History:  Diagnosis Date   Anemia    Anxiety    Arthritis    Asthma    asthma as a child, no  problems as an adult   Diabetes mellitus without complication (HCC)    type1 - insulin pump   Dysrhythmia    GERD (gastroesophageal reflux disease)    Headache    HLD (hyperlipidemia)    diet controlled   Hypertension    no meds   Neuromuscular disorder (HCC) 2015   periph. neuropathy- no current problems   Vitamin D deficiency     Family History  Problem Relation Age of Onset   Lupus Maternal Grandmother    Asthma Sister    ADD / ADHD Sister    Past Surgical History:  Procedure Laterality Date   CATARACT EXTRACTION, BILATERAL Bilateral 2022   CHOLECYSTECTOMY N/A 11/11/2021   Procedure: LAPAROSCOPIC CHOLECYSTECTOMY;  Surgeon: Axel Filler, MD;  Location: [facility] OR;  Service: General;  Laterality: N/A;   GASTRIC BYPASS  07/2017   WISDOM TOOTH EXTRACTION     Social History   Social History Narrative   Not on file   Immunization History  Administered Date(s) Administered   Influenza Inj Mdck Quad Pf 02/11/2018   PFIZER(Purple Top)SARS-COV-2 Vaccination 05/28/2019, 06/18/2019, 03/06/2020, 09/13/2020   Pneumococcal Polysaccharide-23 10/07/2013     Objective: Vital Signs: There were no vitals taken for this visit.   Physical Exam   Musculoskeletal Exam: ***  CDAI Exam: CDAI Score: -- Patient Global: --; Provider Global: -- Swollen: --; Tender: -- Joint Exam 09/23/2022   No joint exam has been documented for this visit   There is currently no information documented on the homunculus. Go to the Rheumatology activity and complete the homunculus joint exam.  Investigation: No additional findings.  Imaging: No results found.  Recent Labs: Lab Results  Component Value Date   WBC 6.9 05/19/2022   HGB 11.0 (L) 05/19/2022   PLT 255 05/19/2022   NA 141 05/19/2022   K 3.7 05/19/2022   CL 108 05/19/2022   CO2 25 05/19/2022   GLUCOSE 42 (L) 05/19/2022   BUN 10 05/19/2022   CREATININE 0.76 05/19/2022   BILITOT 0.4 05/19/2022   ALKPHOS 63 08/20/2021   AST 10  05/19/2022   ALT 8 05/19/2022   PROT 6.8 05/19/2022   ALBUMIN 3.3 (L) 08/20/2021   CALCIUM 8.4 (L) 05/19/2022   GFRAA 139 04/17/2020    Speciality Comments: PLQ eye exam: 08/31/2021 WNL @ [facility]. Follow up in 1 year.  Procedures:  No procedures performed Allergies: Fruit extracts, Latex, and Zinc   Assessment / Plan:     Visit Diagnoses: No diagnosis found.  Orders: No orders of the defined types were placed in this encounter.  No orders of the defined types were placed in this encounter.   Face-to-face time spent with patient was *** minutes. Greater than 50% of time was spent in counseling and coordination of care.  Follow-Up Instructions: No follow-ups on file.   Ellen Henri, CMA  Note - This record has been created using Animal nutritionist.  Chart creation errors have been sought, but may not always  have been located. Such creation errors do not reflect on  the standard of medical care.

## 2022-09-19 NOTE — Progress Notes (Unsigned)
Office Visit Note  Patient: [name]             Date of Birth: 1990-11-10           MRN: [id number]             PCP: [name], MD Referring: [name], MD Visit Date: 09/29/2022 Occupation: @GUAROCC @  Subjective:  Generalized aching   History of Present Illness: Blakelynn Sekelsky Friesz is a 32 y.o. female with history of seronegative rheumatoid arthritis and osteoarthritis.  Patient remains on Plaquenil 200 mg 1 tablet by mouth twice daily.  She is tolerating Plaquenil without any side effects and has not missed any doses recently.  Patient reports that she recently had a job change and and has also had some new life stressors.  She states that she has been having some increased fatigue as well as total body aching.  She has difficulty sleep being at night due to aches and pains.  Patient states that the generalized aching seems to worsen with sun exposure.  She has been trying to avoid direct sun exposure throughout the day.  She remains under the care of pain management and is overdue for her back injection.  She states she also has an upcoming appointment with psychiatry on 10/06/2022 to discuss treatment options.  Patient states that she had a recent follow-up visit at [facility] at which time she had the protein creatinine ratio rechecked.  She has a follow-up visit scheduled later this summer with [facility].   Activities of Daily Living:  Patient reports morning stiffness for 30 minutes.   Patient Reports nocturnal pain.  Difficulty dressing/grooming: Denies Difficulty climbing stairs: Denies Difficulty getting out of chair: Denies Difficulty using hands for taps, buttons, cutlery, and/or writing: Denies  Review of Systems  Constitutional:  Positive for fatigue.  HENT:  Negative for mouth sores and mouth dryness.   Eyes:  Negative for dryness.  Respiratory:  Positive for shortness of breath.   Cardiovascular:  Positive for palpitations. Negative for chest  pain.  Gastrointestinal:  Negative for blood in stool, constipation and diarrhea.  Endocrine: Negative for increased urination.  Genitourinary:  Negative for involuntary urination.  Musculoskeletal:  Positive for joint pain, joint pain, joint swelling, myalgias, morning stiffness, muscle tenderness and myalgias. Negative for gait problem and muscle weakness.  Skin:  Positive for hair loss and sensitivity to sunlight. Negative for color change and rash.  Allergic/Immunologic: Negative for susceptible to infections.  Neurological:  Positive for headaches. Negative for dizziness.  Hematological:  Negative for swollen glands.  Psychiatric/Behavioral:  Positive for depressed mood and sleep disturbance. The patient is nervous/anxious.     PMFS History:  Patient Active Problem List   Diagnosis Date Noted   IDA (iron deficiency anemia) 11/06/2020   Intractable abdominal pain 07/26/2020   Right upper quadrant abdominal pain 07/26/2020   Acute pyelonephritis 07/26/2020   Diabetes mellitus type 1, controlled, with complications (HCC) 07/26/2020   Obesity, Class III, BMI 40-49.9 (morbid obesity) (HCC) 07/26/2020   Enterococcus faecalis infection 07/26/2020   Essential hypertension 07/26/2020   RUQ abdominal pain 07/26/2020   Chronic low back pain 11/22/2013   Clinical depression 10/07/2013   Extreme obesity 10/07/2013   Diabetic neuropathy (HCC) 08/06/2013   General patient noncompliance 12/21/2012   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HCC) 12/30/2010   Appetite disorder 12/30/2010   BP (high blood pressure) 12/30/2010   Muscle ache 12/30/2010   Cachectic (HCC) 12/30/2010   Patellofemoral disorder of left  knee 09/03/2010   Diabetes mellitus 09/03/2010    Past Medical History:  Diagnosis Date   Anemia    Anxiety    Arthritis    Asthma    asthma as a child, no problems as an adult   Diabetes mellitus without complication (HCC)    type1 - insulin pump   Dysrhythmia    GERD (gastroesophageal reflux  disease)    Headache    HLD (hyperlipidemia)    diet controlled   Hypertension    no meds   Neuromuscular disorder (HCC) 2015   periph. neuropathy- no current problems   Vitamin D deficiency     Family History  Problem Relation Age of Onset   Kidney disease Mother    Asthma Sister    ADD / ADHD Sister    Lupus Maternal Grandmother    Past Surgical History:  Procedure Laterality Date   CATARACT EXTRACTION, BILATERAL Bilateral 2022   CHOLECYSTECTOMY N/A 11/11/2021   Procedure: LAPAROSCOPIC CHOLECYSTECTOMY;  Surgeon: Axel Filler, MD;  Location: [facility] OR;  Service: General;  Laterality: N/A;   GASTRIC BYPASS  07/2017   WISDOM TOOTH EXTRACTION     Social History   Social History Narrative   Not on file   Immunization History  Administered Date(s) Administered   Influenza Inj Mdck Quad Pf 02/11/2018   PFIZER(Purple Top)SARS-COV-2 Vaccination 05/28/2019, 06/18/2019, 03/06/2020, 09/13/2020   Pneumococcal Polysaccharide-23 10/07/2013     Objective: Vital Signs: BP (!) 141/86 (BP Location: Left Arm, Patient Position: Sitting, Cuff Size: Large)   Pulse (!) 103   Resp 16   Ht 5' 4.75" (1.645 m)   Wt 254 lb 3.2 oz (115.3 kg)   BMI 42.63 kg/m    Physical Exam Vitals and nursing note reviewed.  Constitutional:      Appearance: She is well-developed.  HENT:     Head: Normocephalic and atraumatic.  Eyes:     Conjunctiva/sclera: Conjunctivae normal.  Cardiovascular:     Rate and Rhythm: Normal rate and regular rhythm.     Heart sounds: Normal heart sounds.  Pulmonary:     Effort: Pulmonary effort is normal.     Breath sounds: Normal breath sounds.  Abdominal:     General: Bowel sounds are normal.     Palpations: Abdomen is soft.  Musculoskeletal:     Cervical back: Normal range of motion.  Lymphadenopathy:     Cervical: No cervical adenopathy.  Skin:    General: Skin is warm and dry.     Capillary Refill: Capillary refill takes less than 2 seconds.  Neurological:      Mental Status: She is alert and oriented to person, place, and time.  Psychiatric:        Behavior: Behavior normal.      Musculoskeletal Exam: Generalized hyperalgesia and positive tender points noted.  C-spine has good range of motion.  Trapezius muscle tension tenderness bilaterally.  Shoulder joints, elbow joints, wrist joints, MCPs, PIPs, DIPs have good range of motion with no synovitis.  Tenderness over the right wrist joint.  Complete fist formation bilaterally.    Knee joints have good range of motion with no warmth or effusion.  CDAI Exam: CDAI Score: 7  Patient Global: 30 / 100; Provider Global: 30 / 100 Swollen: 0 ; Tender: 1  Joint Exam 09/29/2022      Right  Left  Wrist   Tender        Investigation: No additional findings.  Imaging: No results found.  Recent  Labs: Lab Results  Component Value Date   WBC 6.9 05/19/2022   HGB 11.0 (L) 05/19/2022   PLT 255 05/19/2022   NA 141 05/19/2022   K 3.7 05/19/2022   CL 108 05/19/2022   CO2 25 05/19/2022   GLUCOSE 42 (L) 05/19/2022   BUN 10 05/19/2022   CREATININE 0.76 05/19/2022   BILITOT 0.4 05/19/2022   ALKPHOS 63 08/20/2021   AST 10 05/19/2022   ALT 8 05/19/2022   PROT 6.8 05/19/2022   ALBUMIN 3.3 (L) 08/20/2021   CALCIUM 8.4 (L) 05/19/2022   GFRAA 139 04/17/2020    Speciality Comments: PLQ eye exam: 08/31/2021 WNL @ [facility]. Follow up in 1 year.  Procedures:  No procedures performed Allergies: Fruit extracts, Latex, and Zinc   Assessment / Plan:     Visit Diagnoses: Seronegative rheumatoid arthritis (HCC) - U/S+ syonvitis Bilateral wrists/left MCP, RF-, CCP-, 14-3-3 eta negative: Patient presents today with ongoing aching in both hands.  She has tenderness over the right wrist joint today but no tenosynovitis or synovitis was noted.  She was able to make a complete fist bilaterally.  She remains on Plaquenil 200 mg 1 tablet by mouth twice daily.  She has been tolerating Plaquenil  without any side effects and has not missed any doses recently.  She continues to find Plaquenil to be effective at managing her symptoms from rheumatoid arthritis.  She continues to have generalized total body pain consistent with myofascial pain.  Discussed that she may benefit from reinitiating Cymbalta.  She remains under the care of pain management. Lab work from 05/19/2022 was reviewed today in the office: ANA negative, Ro antibody negative, La antibody negative, Smith antibody negative, RNP negative, SCL 70 negative, complements within normal limits, double-stranded and negative, ESR 22. No new clinical features of systemic lupus at this time.  Patient will remain on Plaquenil as prescribed.  She was advised to notify us if she develops any signs or symptoms of a flare.  She will follow-up in the office in 5 months or sooner if needed.  High risk medication use - Plaquenil 200 mg 1 tablet by mouth twice daily.  PLQ eye exam: 08/31/2021 WNL @ [facility]hthal[facility]. Follow up in 1 year.  Patient plans on calling to schedule an updated Plaquenil eye examination.  CBC and CMP updated on 05/19/22.  Orders for CBC and CMP were released today. - Plan: COMPLETE METABOLIC PANEL WITH GFR, CBC with Differential/Platelet  Carpal tunnel syndrome, right upper limb: Not currently symptomatic.  Pain in both hands - X-rays of both hands were unremarkable on 02/25/2021.  No radiographic progression was noted when compared to x-rays from 2020.  Patient has tenderness over the right wrist joint on examination today.  No synovitis noted on exam.  Chronic pain of right knee: No warmth or effusion noted.  Patellofemoral disorder of left knee: Good range of motion of the left knee joint on examination today.  No warmth or effusion noted.  Spondylolysis, lumbar region - Followed by pain management. Updated MRI of lumbar spine on 05/08/22: Mild lumbar spondylosis and facet hypertrophy without evidence of neural  impingement.  Chronic SI joint pain - Followed by pain management.  Positive ANA (antinuclear antibody) - ANA positive 11/15/19.06/17/2020 ANA neg. 01/31/22: ANA negative.  Lab work from 01/31/22: ANA negative, dsDNA negative, complements WNL.  The following lab work will be obtained today for further evaluation.  She was advised to notify us if she develops any new or worsening  symptoms. - Plan: COMPLETE METABOLIC PANEL WITH GFR, CBC with Differential/Platelet, ANA, Anti-DNA antibody, double-stranded, C3 and C4, Sedimentation rate  Family history of systemic lupus erythematosus - Maternal grandmother  Myofascial pain: She has been experiencing increased myalgias and muscle tenderness consistent with myofascial pain syndrome.  She is been having difficulty sleeping at night due to total body pain.  She has a prescription for tramadol and hydrocodone for pain relief but she does not like having to take narcotics.  Discussed that she may benefit from reinitiating Cymbalta.  Patient has upcoming appointment with psychiatry on 10/06/2022 at which time she was encouraged to discuss the possibility of reinitiating Cymbalta.  Trapezius muscle spasm: She continues to have ongoing trapezius muscle tension and tenderness bilaterally.  Other fatigue: She is been experiencing increased fatigue recently.  She has not been sleeping well at night which is likely contributing to her level of fatigue.  She also notices increased fatigue after sun exposure.  She has ongoing photosensitivity and was encouraged to wear sunscreen on a daily basis as well as avoid direct sun exposure.  Chronic pain syndrome - Under the care of pain management.  She remains on hydrocodone and tramadol as needed for pain relief.  Patient states that she does not like taking hydrocodone so has been trying to push through the pain recently.  Other medical conditions are listed a follows:   History of iron deficiency anemia  Vitamin D  deficiency  Vitamin B12 deficiency  History of depression  Type 1 diabetes mellitus with diabetic polyneuropathy (HCC)  History of gastroesophageal reflux (GERD)  Diabetic gastroparesis (HCC)  History of asthma  Orders: Orders Placed This Encounter  Procedures   COMPLETE METABOLIC PANEL WITH GFR   CBC with Differential/Platelet   ANA   Anti-DNA antibody, double-stranded   C3 and C4   Sedimentation rate   No orders of the defined types were placed in this encounter.   Follow-Up Instructions: Return in about 5 months (around 03/01/2023) for Rheumatoid arthritis, Osteoarthritis.   Gearldine Bienenstock, PA-C  Note - This record has been created using Dragon software.  Chart creation errors have been sought, but may not always  have been located. Such creation errors do not reflect on  the standard of medical care.

## 2022-09-23 ENCOUNTER — Ambulatory Visit: Payer: No Typology Code available for payment source | Admitting: Physician Assistant

## 2022-09-23 DIAGNOSIS — E559 Vitamin D deficiency, unspecified: Secondary | ICD-10-CM

## 2022-09-23 DIAGNOSIS — Z8709 Personal history of other diseases of the respiratory system: Secondary | ICD-10-CM

## 2022-09-23 DIAGNOSIS — R5383 Other fatigue: Secondary | ICD-10-CM

## 2022-09-23 DIAGNOSIS — Z79899 Other long term (current) drug therapy: Secondary | ICD-10-CM

## 2022-09-23 DIAGNOSIS — Z8269 Family history of other diseases of the musculoskeletal system and connective tissue: Secondary | ICD-10-CM

## 2022-09-23 DIAGNOSIS — Z862 Personal history of diseases of the blood and blood-forming organs and certain disorders involving the immune mechanism: Secondary | ICD-10-CM

## 2022-09-23 DIAGNOSIS — E538 Deficiency of other specified B group vitamins: Secondary | ICD-10-CM

## 2022-09-23 DIAGNOSIS — R768 Other specified abnormal immunological findings in serum: Secondary | ICD-10-CM

## 2022-09-23 DIAGNOSIS — Z8719 Personal history of other diseases of the digestive system: Secondary | ICD-10-CM

## 2022-09-23 DIAGNOSIS — M62838 Other muscle spasm: Secondary | ICD-10-CM

## 2022-09-23 DIAGNOSIS — E1143 Type 2 diabetes mellitus with diabetic autonomic (poly)neuropathy: Secondary | ICD-10-CM

## 2022-09-23 DIAGNOSIS — M4306 Spondylolysis, lumbar region: Secondary | ICD-10-CM

## 2022-09-23 DIAGNOSIS — G894 Chronic pain syndrome: Secondary | ICD-10-CM

## 2022-09-23 DIAGNOSIS — G5601 Carpal tunnel syndrome, right upper limb: Secondary | ICD-10-CM

## 2022-09-23 DIAGNOSIS — M79641 Pain in right hand: Secondary | ICD-10-CM

## 2022-09-23 DIAGNOSIS — G8929 Other chronic pain: Secondary | ICD-10-CM

## 2022-09-23 DIAGNOSIS — Z8659 Personal history of other mental and behavioral disorders: Secondary | ICD-10-CM

## 2022-09-23 DIAGNOSIS — E1042 Type 1 diabetes mellitus with diabetic polyneuropathy: Secondary | ICD-10-CM

## 2022-09-23 DIAGNOSIS — M222X2 Patellofemoral disorders, left knee: Secondary | ICD-10-CM

## 2022-09-23 DIAGNOSIS — M7918 Myalgia, other site: Secondary | ICD-10-CM

## 2022-09-23 DIAGNOSIS — M0600 Rheumatoid arthritis without rheumatoid factor, unspecified site: Secondary | ICD-10-CM

## 2022-09-29 ENCOUNTER — Other Ambulatory Visit: Payer: Self-pay

## 2022-09-29 ENCOUNTER — Encounter: Payer: Self-pay | Admitting: Physician Assistant

## 2022-09-29 ENCOUNTER — Ambulatory Visit: Payer: No Typology Code available for payment source | Attending: Physician Assistant | Admitting: Physician Assistant

## 2022-09-29 VITALS — BP 141/86 | HR 103 | Resp 16 | Ht 64.75 in | Wt 254.2 lb

## 2022-09-29 DIAGNOSIS — G8929 Other chronic pain: Secondary | ICD-10-CM

## 2022-09-29 DIAGNOSIS — Z79899 Other long term (current) drug therapy: Secondary | ICD-10-CM | POA: Diagnosis not present

## 2022-09-29 DIAGNOSIS — Z862 Personal history of diseases of the blood and blood-forming organs and certain disorders involving the immune mechanism: Secondary | ICD-10-CM

## 2022-09-29 DIAGNOSIS — K3184 Gastroparesis: Secondary | ICD-10-CM

## 2022-09-29 DIAGNOSIS — E1143 Type 2 diabetes mellitus with diabetic autonomic (poly)neuropathy: Secondary | ICD-10-CM

## 2022-09-29 DIAGNOSIS — R768 Other specified abnormal immunological findings in serum: Secondary | ICD-10-CM

## 2022-09-29 DIAGNOSIS — M25561 Pain in right knee: Secondary | ICD-10-CM

## 2022-09-29 DIAGNOSIS — G5601 Carpal tunnel syndrome, right upper limb: Secondary | ICD-10-CM

## 2022-09-29 DIAGNOSIS — M79642 Pain in left hand: Secondary | ICD-10-CM

## 2022-09-29 DIAGNOSIS — M0600 Rheumatoid arthritis without rheumatoid factor, unspecified site: Secondary | ICD-10-CM

## 2022-09-29 DIAGNOSIS — Z8709 Personal history of other diseases of the respiratory system: Secondary | ICD-10-CM

## 2022-09-29 DIAGNOSIS — E1042 Type 1 diabetes mellitus with diabetic polyneuropathy: Secondary | ICD-10-CM

## 2022-09-29 DIAGNOSIS — G894 Chronic pain syndrome: Secondary | ICD-10-CM

## 2022-09-29 DIAGNOSIS — M533 Sacrococcygeal disorders, not elsewhere classified: Secondary | ICD-10-CM

## 2022-09-29 DIAGNOSIS — M62838 Other muscle spasm: Secondary | ICD-10-CM

## 2022-09-29 DIAGNOSIS — M7918 Myalgia, other site: Secondary | ICD-10-CM

## 2022-09-29 DIAGNOSIS — M79641 Pain in right hand: Secondary | ICD-10-CM | POA: Diagnosis not present

## 2022-09-29 DIAGNOSIS — E559 Vitamin D deficiency, unspecified: Secondary | ICD-10-CM

## 2022-09-29 DIAGNOSIS — M4306 Spondylolysis, lumbar region: Secondary | ICD-10-CM

## 2022-09-29 DIAGNOSIS — R7689 Other specified abnormal immunological findings in serum: Secondary | ICD-10-CM

## 2022-09-29 DIAGNOSIS — M222X2 Patellofemoral disorders, left knee: Secondary | ICD-10-CM

## 2022-09-29 DIAGNOSIS — Z8659 Personal history of other mental and behavioral disorders: Secondary | ICD-10-CM

## 2022-09-29 DIAGNOSIS — R5383 Other fatigue: Secondary | ICD-10-CM

## 2022-09-29 DIAGNOSIS — Z8719 Personal history of other diseases of the digestive system: Secondary | ICD-10-CM

## 2022-09-29 DIAGNOSIS — E538 Deficiency of other specified B group vitamins: Secondary | ICD-10-CM

## 2022-09-29 DIAGNOSIS — Z8269 Family history of other diseases of the musculoskeletal system and connective tissue: Secondary | ICD-10-CM

## 2022-09-29 MED ORDER — DICLOFENAC SODIUM 1 % EX GEL: CUTANEOUS | 2 refills | Status: AC

## 2022-09-29 NOTE — Telephone Encounter (Signed)
Please review and send pended refill for Voltaren gel. Thanks!

## 2022-09-30 LAB — ANTI-DNA ANTIBODY, DOUBLE-STRANDED: ds DNA Ab: <1 IU/mL

## 2022-09-30 LAB — CBC WITH DIFFERENTIAL/PLATELET
Absolute Monocytes: 545 cells/uL (ref 200–950)
Basophils Absolute: 48 cells/uL (ref 0–200)
Basophils Relative: 0.7 %
Eosinophils Absolute: 41 cells/uL (ref 15–500)
Eosinophils Relative: 0.6 %
HCT: 36.1 % (ref 35.0–45.0)
Hemoglobin: 11.5 g/dL — ABNORMAL LOW (ref 11.7–15.5)
Lymphs Abs: 3264 cells/uL (ref 850–3900)
MCH: 26.1 pg — ABNORMAL LOW (ref 27.0–33.0)
MCHC: 31.9 g/dL — ABNORMAL LOW (ref 32.0–36.0)
MCV: 82 fL (ref 80.0–100.0)
MPV: 14.2 fL — ABNORMAL HIGH (ref 7.5–12.5)
Monocytes Relative: 7.9 %
Neutro Abs: 3002 cells/uL (ref 1500–7800)
Neutrophils Relative %: 43.5 %
Platelets: 158 10*3/uL (ref 140–400)
RBC: 4.4 10*6/uL (ref 3.80–5.10)
RDW: 13 % (ref 11.0–15.0)
Total Lymphocyte: 47.3 %
WBC: 6.9 10*3/uL (ref 3.8–10.8)

## 2022-09-30 LAB — COMPLETE METABOLIC PANEL WITH GFR
AG Ratio: 1.2 (calc) (ref 1.0–2.5)
ALT: 6 U/L (ref 6–29)
AST: 12 U/L (ref 10–30)
Albumin: 4 g/dL (ref 3.6–5.1)
Alkaline phosphatase (APISO): 60 U/L (ref 31–125)
BUN: 11 mg/dL (ref 7–25)
CO2: 21 mmol/L (ref 20–32)
Calcium: 9.2 mg/dL (ref 8.6–10.2)
Chloride: 105 mmol/L (ref 98–110)
Creat: 0.83 mg/dL (ref 0.50–0.97)
Globulin: 3.3 g/dL (calc) (ref 1.9–3.7)
Glucose, Bld: 84 mg/dL (ref 65–99)
Potassium: 3.8 mmol/L (ref 3.5–5.3)
Sodium: 139 mmol/L (ref 135–146)
Total Bilirubin: 0.6 mg/dL (ref 0.2–1.2)
Total Protein: 7.3 g/dL (ref 6.1–8.1)
eGFR: 97 mL/min/{1.73_m2} (ref >=60)

## 2022-09-30 LAB — ANA: Anti Nuclear Antibody (ANA): NEGATIVE

## 2022-09-30 LAB — C3 AND C4
C3 Complement: 148 mg/dL (ref 83–193)
C4 Complement: 31 mg/dL (ref 15–57)

## 2022-09-30 LAB — SEDIMENTATION RATE: Sed Rate: 31 mm/h — ABNORMAL HIGH (ref 0–20)

## 2022-09-30 NOTE — Progress Notes (Signed)
CBC stable. CMP WNL.  ESR remains elevated-31. We will continue to monitor.

## 2022-10-03 NOTE — Progress Notes (Signed)
ANA negative.  dsDNA is negative  Complements WNL  Lab are not consistent with autoimmune disease/flare.

## 2022-11-28 ENCOUNTER — Telehealth (HOSPITAL_COMMUNITY): Payer: Self-pay | Admitting: Professional

## 2023-01-12 NOTE — Progress Notes (Signed)
Office Visit Note  Patient: [name]             Date of Birth: [birth year]           MRN: [id number]             PCP: [name], MD Referring: [name], MD Visit Date: 01/13/2023 Occupation: @GUAROCC @  Subjective:  New concerns   History of Present Illness: [name] is a 32 y.o. female  with history of seronegative rheumatoid arthritis and myofascial pain. She is taking Plaquenil 200 mg 1 tablet by mouth twice daily.  She is tolerating Plaquenil without any side effects.  Patient states that 1 month ago she was under tremendous amount of stress and developed several new concerns.  She states that at the time she was having sores in her mouth as well as a rash on her arms and legs.  The rash has been coming and going.  She denies any nasal ulcers, sicca symptoms, or Raynaud's phenomenon.  She has occasional swollen lymph nodes in her neck.  Patient states that her stress levels have gradually improved.  She has been taking Prozac as prescribed by her psychiatrist.  She was not changed to Cymbalta due to her psychiatrist not thinking it would be as effective for her mood per patient. She has been experiencing increased generalized myofascial pain.  Her symptoms have been more severe with her stress levels.  She is having more frequent flares. Her pain has been severe at night. She experiences intermittent pain in both hands.  She woke up this morning with increased discomfort in her right wrist.   Activities of Daily Living:  Patient reports morning stiffness for 30 minutes.   Patient Reports nocturnal pain.  Difficulty dressing/grooming: Denies Difficulty climbing stairs: Denies Difficulty getting out of chair: Denies Difficulty using hands for taps, buttons, cutlery, and/or writing: Reports  Review of Systems  Constitutional:  Positive for fatigue.  HENT:  Positive for mouth sores. Negative for mouth dryness.   Eyes:  Negative for dryness.  Respiratory:   Positive for shortness of breath.        On exertion   Cardiovascular:  Negative for chest pain and palpitations.  Gastrointestinal:  Positive for constipation. Negative for blood in stool and diarrhea.  Endocrine: Negative for increased urination.  Genitourinary:  Negative for involuntary urination.  Musculoskeletal:  Positive for joint pain, joint pain, joint swelling, myalgias, muscle weakness, morning stiffness, muscle tenderness and myalgias.  Skin:  Positive for rash and sensitivity to sunlight. Negative for color change and hair loss.  Allergic/Immunologic: Negative for susceptible to infections.  Neurological:  Positive for dizziness and headaches.  Hematological:  Negative for swollen glands.  Psychiatric/Behavioral:  Positive for sleep disturbance. Negative for depressed mood. The patient is not nervous/anxious.     PMFS History:  Patient Active Problem List   Diagnosis Date Noted   IDA (iron deficiency anemia) 11/06/2020   Intractable abdominal pain 07/26/2020   Right upper quadrant abdominal pain 07/26/2020   Acute pyelonephritis 07/26/2020   Diabetes mellitus type 1, controlled, with complications (HCC) 07/26/2020   Obesity, Class III, BMI 40-49.9 (morbid obesity) (HCC) 07/26/2020   Enterococcus faecalis infection 07/26/2020   Essential hypertension 07/26/2020   RUQ abdominal pain 07/26/2020   Chronic low back pain 11/22/2013   Clinical depression 10/07/2013   Extreme obesity 10/07/2013   Diabetic neuropathy (HCC) 08/06/2013   General patient noncompliance 12/21/2012   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HCC) 12/30/2010  Appetite disorder 12/30/2010   BP (high blood pressure) 12/30/2010   Muscle ache 12/30/2010   Cachectic (HCC) 12/30/2010   Patellofemoral disorder of left knee 09/03/2010   Diabetes mellitus (HCC) 09/03/2010    Past Medical History:  Diagnosis Date   Anemia    Anxiety    Arthritis    Asthma    asthma as a child, no problems as an adult   Diabetes mellitus  without complication (HCC)    type1 - insulin pump   Dysrhythmia    GERD (gastroesophageal reflux disease)    Headache    HLD (hyperlipidemia)    diet controlled   Hypertension    no meds   Neuromuscular disorder (HCC) 2015   periph. neuropathy- no current problems   Vitamin D deficiency     Family History  Problem Relation Age of Onset   Kidney disease Mother    Asthma Sister    ADD / ADHD Sister    Lupus Maternal Grandmother    Past Surgical History:  Procedure Laterality Date   CATARACT EXTRACTION, BILATERAL Bilateral 2022   CHOLECYSTECTOMY N/A 11/11/2021   Procedure: LAPAROSCOPIC CHOLECYSTECTOMY;  Surgeon: Axel Filler, MD;  Location: [facility] OR;  Service: General;  Laterality: N/A;   GASTRIC BYPASS  07/2017   WISDOM TOOTH EXTRACTION     Social History   Social History Narrative   Not on file   Immunization History  Administered Date(s) Administered   Influenza Inj Mdck Quad Pf 02/11/2018   PFIZER(Purple Top)SARS-COV-2 Vaccination 05/28/2019, 06/18/2019, 03/06/2020, 09/13/2020   Pneumococcal Polysaccharide-23 10/07/2013     Objective: Vital Signs: BP (!) 140/95 (BP Location: Left Arm, Patient Position: Sitting, Cuff Size: Large)   Pulse 69   Resp 16   Ht 5\' 4"  (1.626 m)   Wt 248 lb (112.5 kg)   BMI 42.57 kg/m    Physical Exam Vitals and nursing note reviewed.  Constitutional:      Appearance: She is well-developed.  HENT:     Head: Normocephalic and atraumatic.  Eyes:     Conjunctiva/sclera: Conjunctivae normal.  Cardiovascular:     Rate and Rhythm: Normal rate and regular rhythm.     Heart sounds: Normal heart sounds.  Pulmonary:     Effort: Pulmonary effort is normal.     Breath sounds: Normal breath sounds.  Abdominal:     General: Bowel sounds are normal.     Palpations: Abdomen is soft.  Musculoskeletal:     Cervical back: Normal range of motion.  Lymphadenopathy:     Cervical: No cervical adenopathy.  Skin:    General: Skin is warm and  dry.     Capillary Refill: Capillary refill takes less than 2 seconds.  Neurological:     Mental Status: She is alert and oriented to person, place, and time.  Psychiatric:        Behavior: Behavior normal.      Musculoskeletal Exam: Generalized hyperalgesia and positive tender points on exam.  C-spine has limited range of motion with lateral rotation.  Trapezius muscle tension tenderness bilaterally.  Limited mobility of the lumbar spine.  Shoulder joints have painful range of motion bilaterally.  Elbow joints have good range of motion with no tenderness or inflammation.  Tenderness over the right wrist.  No synovitis of MCP joints.  Complete fist formation bilaterally.  Hip joints have good range of motion with no groin pain.  Knee joints have good range of motion no warmth or effusion.  Ankle joints have  good range of motion with no tenderness or joint swelling.  CDAI Exam: CDAI Score: -- Patient Global: --; Provider Global: -- Swollen: --; Tender: -- Joint Exam 01/13/2023   No joint exam has been documented for this visit   There is currently no information documented on the homunculus. Go to the Rheumatology activity and complete the homunculus joint exam.  Investigation: No additional findings.  Imaging: No results found.  Recent Labs: Lab Results  Component Value Date   WBC 6.9 09/29/2022   HGB 11.5 (L) 09/29/2022   PLT 158 09/29/2022   NA 139 09/29/2022   K 3.8 09/29/2022   CL 105 09/29/2022   CO2 21 09/29/2022   GLUCOSE 84 09/29/2022   BUN 11 09/29/2022   CREATININE 0.83 09/29/2022   BILITOT 0.6 09/29/2022   ALKPHOS 63 08/20/2021   AST 12 09/29/2022   ALT 6 09/29/2022   PROT 7.3 09/29/2022   ALBUMIN 3.3 (L) 08/20/2021   CALCIUM 9.2 09/29/2022   GFRAA 139 04/17/2020    Speciality Comments: PLQ eye exam: 08/31/2021 WNL @ [facility]. Follow up in 1 year.  Procedures:  No procedures performed Allergies: Fruit extracts, Latex, and Zinc       Assessment / Plan:     Visit Diagnoses: Seronegative rheumatoid arthritis (HCC) - U/S+ syonvitis Bilateral wrists/left MCP, RF-, CCP-, 14-3-3 eta negative: She has no synovitis on examination today.  Patient woke up this morning with increased discomfort in her right wrist joint but no inflammation was noted.  She remains on Plaquenil 200 mg 1 tablet by mouth twice daily.  She is tolerating Plaquenil without any side effects and has not had any gaps in therapy.  She will remain on Plaquenil as prescribed.  She was advised to notify us if she develops signs or symptoms of a flare.  She will follow-up in the office in 5 months or sooner if needed.- Plan: hydroxychloroquine (PLAQUENIL) 200 MG tablet  High risk medication use - Plaquenil 200 mg 1 tablet by mouth twice daily.  PLQ eye exam: 08/31/2021 WNL @ [facility]. Overdue to update plaquenil eye exam.  CBC and CMP updated today.   - Plan: CBC with Differential/Platelet, COMPLETE METABOLIC PANEL WITH GFR  Carpal tunnel syndrome, right upper limb: No currently symptomatic.  Pain in both hands - X-rays of both hands were unremarkable on 02/25/2021.  No radiographic progression was noted when compared to x-rays from 2020.  Patient has tenderness over the right wrist joint today.  No synovitis noted on exam.  Chronic pain of right knee: No warmth or effusion noted on examination today.  Patellofemoral disorder of left knee: No warmth or effusion noted today.  Spondylolysis, lumbar region: Chronic pain.  Patient has injections every 6 months.  Chronic SI joint pain: Chronic pain.  Under the care of pain management.  Positive ANA (antinuclear antibody) - ANA positive 11/15/19.06/17/2020 ANA neg. 01/31/22: ANA negative.  Lab work from 01/31/22: ANA negative, dsDNA negative, complements WNL.  Lab work from 09/29/22: ESR 31, complements WNL, and dsDNA negative.  Patient was under tremendous amount of stress 1 month ago which time she  started to experience sores in her mouth as well as recurrent rashes.  She was also having more frequent flares of myofascial pain. No rash was noted on examination today.  The oral ulcers have healed. Plan to obtain the following lab work today. Plan: Protein / creatinine ratio, urine, CBC with Differential/Platelet, ANA, COMPLETE METABOLIC PANEL WITH GFR, Anti-DNA antibody, double-stranded,  C3 and C4, Sedimentation rate, CK, C-reactive protein  Family history of systemic lupus erythematosus -Plan to obtain the following lab work today.  Plan: hydroxychloroquine (PLAQUENIL) 200 MG tablet, ANA  Myofascial pain -Patient has been experiencing more frequent and severe flares of myofascial pain.  She attributes the more frequent flares to being under increased stress.  Patient discussed switching to Cymbalta with her psychiatrist at her last office visit but according to the patient her psychiatrist felt that Cymbalta would not be effective at managing her depression and anxiety.  She remains on Prozac as prescribed.  Patient remains under the care of pain management.  She takes Norco sparingly for severe pain in her lower back.  Her myofascial pain does not respond to Norco.  Different treatment options were discussed.  Her pain has been most severe at night and has been interfering with her quality of sleep.  Plan on initiating a trial of gabapentin 300 mg 1 capsule at bedtime.  She will notify us if she cannot tolerate taking gabapentin.  Plan: Sedimentation rate, CK, C-reactive protein  Trapezius muscle spasm: She has intermittent trapezius muscle tension and tenderness bilaterally.  She has been experiencing more frequent flares.  She has a prescription for baclofen which she takes sparingly for muscle spasms.  Other fatigue: Chronic.  She has been experiencing more frequent and severe bouts of fatigue which she attributes to being under increased stress.  She has had interrupted sleep at night due to  nocturnal pain which is also contributing to her level of fatigue throughout the day.  Chronic pain syndrome: Under the care of pain management.  She takes Norco very sparingly.  Plan on initiating a trial of gabapentin 300 mg 1 capsule at bedtime.  She will notify us if she cannot tolerate taking gabapentin.  Other medical conditions are listed as follows:   History of iron deficiency anemia  Vitamin D deficiency  Vitamin B12 deficiency  History of depression  Type 1 diabetes mellitus with diabetic polyneuropathy (HCC)  History of gastroesophageal reflux (GERD)  Diabetic gastroparesis (HCC)  History of asthma    Orders: Orders Placed This Encounter  Procedures   Protein / creatinine ratio, urine   CBC with Differential/Platelet   ANA   COMPLETE METABOLIC PANEL WITH GFR   Anti-DNA antibody, double-stranded   C3 and C4   Sedimentation rate   CK   C-reactive protein   Meds ordered this encounter  Medications   gabapentin (NEURONTIN) 300 MG capsule    Sig: Take 1 capsule (300 mg total) by mouth at bedtime.    Dispense:  30 capsule    Refill:  0   hydroxychloroquine (PLAQUENIL) 200 MG tablet    Sig: TAKE 1 TABLET(200 MG) BY MOUTH TWICE DAILY    Dispense:  180 tablet    Refill:  0     Follow-Up Instructions: Return in about 5 months (around 06/13/2023) for Rheumatoid arthritis.   Gearldine Bienenstock, PA-C  Note - This record has been created using Dragon software.  Chart creation errors have been sought, but may not always  have been located. Such creation errors do not reflect on  the standard of medical care.

## 2023-01-13 ENCOUNTER — Encounter: Payer: Self-pay | Admitting: Hematology and Oncology

## 2023-01-13 ENCOUNTER — Ambulatory Visit: Payer: Medicaid Other | Attending: Physician Assistant | Admitting: Physician Assistant

## 2023-01-13 ENCOUNTER — Encounter: Payer: Self-pay | Admitting: Physician Assistant

## 2023-01-13 VITALS — BP 140/95 | HR 69 | Resp 16 | Ht 64.0 in | Wt 248.0 lb

## 2023-01-13 DIAGNOSIS — Z8709 Personal history of other diseases of the respiratory system: Secondary | ICD-10-CM

## 2023-01-13 DIAGNOSIS — E538 Deficiency of other specified B group vitamins: Secondary | ICD-10-CM

## 2023-01-13 DIAGNOSIS — M0600 Rheumatoid arthritis without rheumatoid factor, unspecified site: Secondary | ICD-10-CM

## 2023-01-13 DIAGNOSIS — M7918 Myalgia, other site: Secondary | ICD-10-CM

## 2023-01-13 DIAGNOSIS — R768 Other specified abnormal immunological findings in serum: Secondary | ICD-10-CM

## 2023-01-13 DIAGNOSIS — E559 Vitamin D deficiency, unspecified: Secondary | ICD-10-CM

## 2023-01-13 DIAGNOSIS — M222X2 Patellofemoral disorders, left knee: Secondary | ICD-10-CM

## 2023-01-13 DIAGNOSIS — G8929 Other chronic pain: Secondary | ICD-10-CM

## 2023-01-13 DIAGNOSIS — Z8269 Family history of other diseases of the musculoskeletal system and connective tissue: Secondary | ICD-10-CM

## 2023-01-13 DIAGNOSIS — M533 Sacrococcygeal disorders, not elsewhere classified: Secondary | ICD-10-CM

## 2023-01-13 DIAGNOSIS — Z79899 Other long term (current) drug therapy: Secondary | ICD-10-CM

## 2023-01-13 DIAGNOSIS — K3184 Gastroparesis: Secondary | ICD-10-CM

## 2023-01-13 DIAGNOSIS — G5601 Carpal tunnel syndrome, right upper limb: Secondary | ICD-10-CM

## 2023-01-13 DIAGNOSIS — Z8659 Personal history of other mental and behavioral disorders: Secondary | ICD-10-CM

## 2023-01-13 DIAGNOSIS — Z862 Personal history of diseases of the blood and blood-forming organs and certain disorders involving the immune mechanism: Secondary | ICD-10-CM

## 2023-01-13 DIAGNOSIS — M62838 Other muscle spasm: Secondary | ICD-10-CM

## 2023-01-13 DIAGNOSIS — M25561 Pain in right knee: Secondary | ICD-10-CM

## 2023-01-13 DIAGNOSIS — M79641 Pain in right hand: Secondary | ICD-10-CM

## 2023-01-13 DIAGNOSIS — Z8719 Personal history of other diseases of the digestive system: Secondary | ICD-10-CM

## 2023-01-13 DIAGNOSIS — R5383 Other fatigue: Secondary | ICD-10-CM

## 2023-01-13 DIAGNOSIS — M4306 Spondylolysis, lumbar region: Secondary | ICD-10-CM

## 2023-01-13 DIAGNOSIS — M79642 Pain in left hand: Secondary | ICD-10-CM

## 2023-01-13 DIAGNOSIS — E1143 Type 2 diabetes mellitus with diabetic autonomic (poly)neuropathy: Secondary | ICD-10-CM

## 2023-01-13 DIAGNOSIS — E1042 Type 1 diabetes mellitus with diabetic polyneuropathy: Secondary | ICD-10-CM

## 2023-01-13 DIAGNOSIS — G894 Chronic pain syndrome: Secondary | ICD-10-CM

## 2023-01-13 MED ORDER — HYDROXYCHLOROQUINE SULFATE 200 MG PO TABS: ORAL_TABLET | ORAL | 0 refills | Status: DC

## 2023-01-13 MED ORDER — GABAPENTIN 300 MG PO CAPS: 300.0000 mg | ORAL_CAPSULE | Freq: Every day | ORAL | 0 refills | Status: DC

## 2023-01-16 LAB — ANTI-DNA ANTIBODY, DOUBLE-STRANDED: ds DNA Ab: <1 [IU]/mL

## 2023-01-16 LAB — SEDIMENTATION RATE: Sed Rate: 25 mm/h — ABNORMAL HIGH (ref 0–20)

## 2023-01-16 LAB — COMPLETE METABOLIC PANEL WITH GFR
AG Ratio: 1 (calc) (ref 1.0–2.5)
ALT: 8 U/L (ref 6–29)
AST: 12 U/L (ref 10–30)
Albumin: 3.4 g/dL — ABNORMAL LOW (ref 3.6–5.1)
Alkaline phosphatase (APISO): 60 U/L (ref 31–125)
BUN: 9 mg/dL (ref 7–25)
CO2: 28 mmol/L (ref 20–32)
Calcium: 8.7 mg/dL (ref 8.6–10.2)
Chloride: 104 mmol/L (ref 98–110)
Creat: 0.77 mg/dL (ref 0.50–0.97)
Globulin: 3.3 g/dL (ref 1.9–3.7)
Glucose, Bld: 133 mg/dL — ABNORMAL HIGH (ref 65–99)
Potassium: 4.2 mmol/L (ref 3.5–5.3)
Sodium: 140 mmol/L (ref 135–146)
Total Bilirubin: 0.5 mg/dL (ref 0.2–1.2)
Total Protein: 6.7 g/dL (ref 6.1–8.1)
eGFR: 106 mL/min/{1.73_m2} (ref >=60)

## 2023-01-16 LAB — CBC WITH DIFFERENTIAL/PLATELET
Absolute Lymphocytes: 2496 {cells}/uL (ref 850–3900)
Absolute Monocytes: 389 {cells}/uL (ref 200–950)
Basophils Absolute: 41 {cells}/uL (ref 0–200)
Basophils Relative: 0.7 %
Eosinophils Absolute: 71 {cells}/uL (ref 15–500)
Eosinophils Relative: 1.2 %
HCT: 35.4 % (ref 35.0–45.0)
Hemoglobin: 11.1 g/dL — ABNORMAL LOW (ref 11.7–15.5)
MCH: 26.4 pg — ABNORMAL LOW (ref 27.0–33.0)
MCHC: 31.4 g/dL — ABNORMAL LOW (ref 32.0–36.0)
MCV: 84.3 fL (ref 80.0–100.0)
MPV: 13.2 fL — ABNORMAL HIGH (ref 7.5–12.5)
Monocytes Relative: 6.6 %
Neutro Abs: 2903 {cells}/uL (ref 1500–7800)
Neutrophils Relative %: 49.2 %
Platelets: 198 10*3/uL (ref 140–400)
RBC: 4.2 10*6/uL (ref 3.80–5.10)
RDW: 12.9 % (ref 11.0–15.0)
Total Lymphocyte: 42.3 %
WBC: 5.9 10*3/uL (ref 3.8–10.8)

## 2023-01-16 LAB — PROTEIN / CREATININE RATIO, URINE
Creatinine, Urine: 185 mg/dL (ref 20–275)
Protein/Creat Ratio: 951 mg/g{creat} — ABNORMAL HIGH (ref 24–184)
Protein/Creatinine Ratio: 0.951 mg/mg{creat} — ABNORMAL HIGH (ref 0.024–0.184)
Total Protein, Urine: 176 mg/dL — ABNORMAL HIGH (ref 5–24)

## 2023-01-16 LAB — TEST AUTHORIZATION

## 2023-01-16 LAB — C-REACTIVE PROTEIN: CRP: <3 mg/L (ref <8.0)

## 2023-01-16 LAB — IRON, TOTAL/TOTAL IRON BINDING CAP
%SAT: 16 % (ref 16–45)
Iron: 46 ug/dL (ref 40–190)
TIBC: 289 ug/dL (ref 250–450)

## 2023-01-16 LAB — C3 AND C4
C3 Complement: 130 mg/dL (ref 83–193)
C4 Complement: 30 mg/dL (ref 15–57)

## 2023-01-16 LAB — ANA: Anti Nuclear Antibody (ANA): NEGATIVE

## 2023-01-16 LAB — CK: Total CK: 55 U/L (ref 29–143)

## 2023-01-16 NOTE — Progress Notes (Signed)
ANA negative.  CK WNL  ESR remains borderline elevated but has improved. CRP WNL.  dsDNA negative  Complements WNL  Glucose 133. Albumin borderline low. Rest of CMP WNL Hemoglobin remains low.  MCH and MCHC are low.  Please see if iron panel can be added?  Protein creatinine ratio remains elevated--but has improved. Please clarify if she has a nephrologist? If not please send to PCP

## 2023-01-16 NOTE — Progress Notes (Signed)
Iron panel Wnl

## 2023-01-25 ENCOUNTER — Encounter: Payer: Self-pay | Admitting: Hematology and Oncology

## 2023-02-15 NOTE — Progress Notes (Unsigned)
Office Visit Note  Patient: [name]             Date of Birth: [birth year]           MRN: [id number]             PCP: [name], MD Referring: [name], MD Visit Date: 03/01/2023 Occupation: @GUAROCC @  Subjective:  No chief complaint on file.   History of Present Illness: [name] is a 32 y.o. female ***     Activities of Daily Living:  Patient reports morning stiffness for *** {minute/hour:19697}.   Patient {ACTIONS;DENIES/REPORTS:21021675::"Denies"} nocturnal pain.  Difficulty dressing/grooming: {ACTIONS;DENIES/REPORTS:21021675::"Denies"} Difficulty climbing stairs: {ACTIONS;DENIES/REPORTS:21021675::"Denies"} Difficulty getting out of chair: {ACTIONS;DENIES/REPORTS:21021675::"Denies"} Difficulty using hands for taps, buttons, cutlery, and/or writing: {ACTIONS;DENIES/REPORTS:21021675::"Denies"}  No Rheumatology ROS completed.   PMFS History:  Patient Active Problem List   Diagnosis Date Noted   IDA (iron deficiency anemia) 11/06/2020   Intractable abdominal pain 07/26/2020   Right upper quadrant abdominal pain 07/26/2020   Acute pyelonephritis 07/26/2020   Diabetes mellitus type 1, controlled, with complications (HCC) 07/26/2020   Obesity, Class III, BMI 40-49.9 (morbid obesity) (HCC) 07/26/2020   Enterococcus faecalis infection 07/26/2020   Essential hypertension 07/26/2020   RUQ abdominal pain 07/26/2020   Chronic low back pain 11/22/2013   Clinical depression 10/07/2013   Extreme obesity 10/07/2013   Diabetic neuropathy (HCC) 08/06/2013   General patient noncompliance 12/21/2012   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HCC) 12/30/2010   Appetite disorder 12/30/2010   BP (high blood pressure) 12/30/2010   Muscle ache 12/30/2010   Cachectic (HCC) 12/30/2010   Patellofemoral disorder of left knee 09/03/2010   Diabetes mellitus (HCC) 09/03/2010    Past Medical History:  Diagnosis Date   Anemia    Anxiety    Arthritis    Asthma    asthma as a child, no  problems as an adult   Diabetes mellitus without complication (HCC)    type1 - insulin pump   Dysrhythmia    GERD (gastroesophageal reflux disease)    Headache    HLD (hyperlipidemia)    diet controlled   Hypertension    no meds   Neuromuscular disorder (HCC) 2015   periph. neuropathy- no current problems   Vitamin D deficiency     Family History  Problem Relation Age of Onset   Kidney disease Mother    Asthma Sister    ADD / ADHD Sister    Lupus Maternal Grandmother    Past Surgical History:  Procedure Laterality Date   CATARACT EXTRACTION, BILATERAL Bilateral 2022   CHOLECYSTECTOMY N/A 11/11/2021   Procedure: LAPAROSCOPIC CHOLECYSTECTOMY;  Surgeon: Axel Filler, MD;  Location: [facility] OR;  Service: General;  Laterality: N/A;   GASTRIC BYPASS  07/2017   WISDOM TOOTH EXTRACTION     Social History   Social History Narrative   Not on file   Immunization History  Administered Date(s) Administered   Influenza Inj Mdck Quad Pf 02/11/2018   PFIZER(Purple Top)SARS-COV-2 Vaccination 05/28/2019, 06/18/2019, 03/06/2020, 09/13/2020   Pneumococcal Polysaccharide-23 10/07/2013     Objective: Vital Signs: There were no vitals taken for this visit.   Physical Exam   Musculoskeletal Exam: ***  CDAI Exam: CDAI Score: -- Patient Global: --; Provider Global: -- Swollen: --; Tender: -- Joint Exam 03/01/2023   No joint exam has been documented for this visit   There is currently no information documented on the homunculus. Go to the Rheumatology activity and complete the homunculus joint exam.  Investigation: No  additional findings.  Imaging: No results found.  Recent Labs: Lab Results  Component Value Date   WBC 5.9 01/13/2023   HGB 11.1 (L) 01/13/2023   PLT 198 01/13/2023   NA 140 01/13/2023   K 4.2 01/13/2023   CL 104 01/13/2023   CO2 28 01/13/2023   GLUCOSE 133 (H) 01/13/2023   BUN 9 01/13/2023   CREATININE 0.77 01/13/2023   BILITOT 0.5 01/13/2023   ALKPHOS  63 08/20/2021   AST 12 01/13/2023   ALT 8 01/13/2023   PROT 6.7 01/13/2023   ALBUMIN 3.3 (L) 08/20/2021   CALCIUM 8.7 01/13/2023   GFRAA 139 04/17/2020    Speciality Comments: PLQ eye exam: 08/31/2021 WNL @ [facility]. Follow up in 1 year.  Procedures:  No procedures performed Allergies: Fruit extracts, Latex, and Zinc   Assessment / Plan:     Visit Diagnoses: Seronegative rheumatoid arthritis (HCC)  High risk medication use  Carpal tunnel syndrome, right upper limb  Pain in both hands  Chronic pain of right knee  Patellofemoral disorder of left knee  Spondylolysis, lumbar region  Chronic SI joint pain  Positive ANA (antinuclear antibody)  Family history of systemic lupus erythematosus  Myofascial pain  Trapezius muscle spasm  Other fatigue  Chronic pain syndrome  History of iron deficiency anemia  Vitamin D deficiency  Vitamin B12 deficiency  History of depression  Type 1 diabetes mellitus with diabetic polyneuropathy (HCC)  History of gastroesophageal reflux (GERD)  Diabetic gastroparesis (HCC)  History of asthma  Orders: No orders of the defined types were placed in this encounter.  No orders of the defined types were placed in this encounter.   Face-to-face time spent with patient was *** minutes. Greater than 50% of time was spent in counseling and coordination of care.  Follow-Up Instructions: No follow-ups on file.   Gearldine Bienenstock, PA-C  Note - This record has been created using Dragon software.  Chart creation errors have been sought, but may not always  have been located. Such creation errors do not reflect on  the standard of medical care.

## 2023-03-01 ENCOUNTER — Ambulatory Visit: Payer: No Typology Code available for payment source | Admitting: Physician Assistant

## 2023-03-01 DIAGNOSIS — R5383 Other fatigue: Secondary | ICD-10-CM

## 2023-03-01 DIAGNOSIS — Z79899 Other long term (current) drug therapy: Secondary | ICD-10-CM

## 2023-03-01 DIAGNOSIS — M222X2 Patellofemoral disorders, left knee: Secondary | ICD-10-CM

## 2023-03-01 DIAGNOSIS — Z862 Personal history of diseases of the blood and blood-forming organs and certain disorders involving the immune mechanism: Secondary | ICD-10-CM

## 2023-03-01 DIAGNOSIS — Z8659 Personal history of other mental and behavioral disorders: Secondary | ICD-10-CM

## 2023-03-01 DIAGNOSIS — G5601 Carpal tunnel syndrome, right upper limb: Secondary | ICD-10-CM

## 2023-03-01 DIAGNOSIS — R768 Other specified abnormal immunological findings in serum: Secondary | ICD-10-CM

## 2023-03-01 DIAGNOSIS — M0600 Rheumatoid arthritis without rheumatoid factor, unspecified site: Secondary | ICD-10-CM

## 2023-03-01 DIAGNOSIS — M7918 Myalgia, other site: Secondary | ICD-10-CM

## 2023-03-01 DIAGNOSIS — M62838 Other muscle spasm: Secondary | ICD-10-CM

## 2023-03-01 DIAGNOSIS — Z8719 Personal history of other diseases of the digestive system: Secondary | ICD-10-CM

## 2023-03-01 DIAGNOSIS — E1143 Type 2 diabetes mellitus with diabetic autonomic (poly)neuropathy: Secondary | ICD-10-CM

## 2023-03-01 DIAGNOSIS — M4306 Spondylolysis, lumbar region: Secondary | ICD-10-CM

## 2023-03-01 DIAGNOSIS — E559 Vitamin D deficiency, unspecified: Secondary | ICD-10-CM

## 2023-03-01 DIAGNOSIS — G894 Chronic pain syndrome: Secondary | ICD-10-CM

## 2023-03-01 DIAGNOSIS — E1042 Type 1 diabetes mellitus with diabetic polyneuropathy: Secondary | ICD-10-CM

## 2023-03-01 DIAGNOSIS — G8929 Other chronic pain: Secondary | ICD-10-CM

## 2023-03-01 DIAGNOSIS — Z8709 Personal history of other diseases of the respiratory system: Secondary | ICD-10-CM

## 2023-03-01 DIAGNOSIS — E538 Deficiency of other specified B group vitamins: Secondary | ICD-10-CM

## 2023-03-01 DIAGNOSIS — Z8269 Family history of other diseases of the musculoskeletal system and connective tissue: Secondary | ICD-10-CM

## 2023-03-01 DIAGNOSIS — M79641 Pain in right hand: Secondary | ICD-10-CM

## 2023-03-07 NOTE — Progress Notes (Signed)
 Office Visit Note  Patient: [name]             Date of Birth: September 21, 1990           MRN: [id number]             PCP: Verena Mems, MD Referring: Verena Mems, MD Visit Date: 03/21/2023 Occupation: @GUAROCC @  Subjective:  Medication monitoring   History of Present Illness: [name] is a 32 y.o. female with history of seronegative rheumatoid arthritis.  Patient remains on  Plaquenil  200 mg 1 tablet by mouth twice daily.  She is tolerating Plaquenil  without any side effects and has not had any recent gaps in therapy.  Patient continues to experience intermittent arthralgias and joint stiffness.  She has also had an increased frequency of oral ulcers which have been painful.  These oral ulcers tend to flare with stress.  Patient states that she tried taking gabapentin  300 mg at bedtime as prescribed but has not noticed any improvement in her pain levels and would like to discontinue.   Activities of Daily Living:  Patient reports morning stiffness for 1 hour.   Patient Reports nocturnal pain.  Difficulty dressing/grooming: Denies Difficulty climbing stairs: Denies Difficulty getting out of chair: Denies Difficulty using hands for taps, buttons, cutlery, and/or writing: Reports  Review of Systems  Constitutional:  Positive for fatigue.  HENT:  Positive for mouth sores and mouth dryness.   Eyes:  Negative for dryness.  Respiratory:  Negative for shortness of breath.   Cardiovascular:  Positive for chest pain. Negative for palpitations.  Gastrointestinal:  Negative for blood in stool, constipation and diarrhea.  Endocrine: Positive for increased urination.  Genitourinary:  Negative for involuntary urination.  Musculoskeletal:  Positive for joint pain, gait problem, joint pain, joint swelling, myalgias, muscle weakness, morning stiffness, muscle tenderness and myalgias.  Skin:  Positive for color change, rash and sensitivity to sunlight. Negative for hair loss.   Allergic/Immunologic: Negative for susceptible to infections.  Neurological:  Positive for headaches. Negative for dizziness.  Hematological:  Positive for swollen glands.  Psychiatric/Behavioral:  Positive for sleep disturbance. Negative for depressed mood. The patient is nervous/anxious.     PMFS History:  Patient Active Problem List   Diagnosis Date Noted   IDA (iron  deficiency anemia) 11/06/2020   Intractable abdominal pain 07/26/2020   Right upper quadrant abdominal pain 07/26/2020   Acute pyelonephritis 07/26/2020   Diabetes mellitus type 1, controlled, with complications (HCC) 07/26/2020   Obesity, Class III, BMI 40-49.9 (morbid obesity) (HCC) 07/26/2020   Enterococcus faecalis infection 07/26/2020   Essential hypertension 07/26/2020   RUQ abdominal pain 07/26/2020   Chronic low back pain 11/22/2013   Clinical depression 10/07/2013   Extreme obesity 10/07/2013   Diabetic neuropathy (HCC) 08/06/2013   General patient noncompliance 12/21/2012   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HCC) 12/30/2010   Appetite disorder 12/30/2010   BP (high blood pressure) 12/30/2010   Muscle ache 12/30/2010   Cachectic (HCC) 12/30/2010   Patellofemoral disorder of left knee 09/03/2010   Diabetes mellitus (HCC) 09/03/2010    Past Medical History:  Diagnosis Date   Anemia    Anxiety    Arthritis    Asthma    asthma as a child, no problems as an adult   Diabetes mellitus without complication (HCC)    type1 - insulin  pump   Dysrhythmia    GERD (gastroesophageal reflux disease)    Headache    HLD (hyperlipidemia)    diet controlled  Hypertension    no meds   Neuromuscular disorder (HCC) 2015   periph. neuropathy- no current problems   Vitamin D deficiency     Family History  Problem Relation Age of Onset   Kidney disease Mother    Asthma Sister    ADD / ADHD Sister    Lupus Maternal Grandmother    Past Surgical History:  Procedure Laterality Date   CATARACT EXTRACTION, BILATERAL Bilateral  2022   CHOLECYSTECTOMY N/A 11/11/2021   Procedure: LAPAROSCOPIC CHOLECYSTECTOMY;  Surgeon: Rubin Calamity, MD;  Location: [facility] OR;  Service: General;  Laterality: N/A;   GASTRIC BYPASS  07/2017   WISDOM TOOTH EXTRACTION     Social History   Social History Narrative   Not on file   Immunization History  Administered Date(s) Administered   Influenza Inj Mdck Quad Pf 02/11/2018   PFIZER(Purple Top)SARS-COV-2 Vaccination 05/28/2019, 06/18/2019, 03/06/2020, 09/13/2020   Pneumococcal Polysaccharide-23 10/07/2013     Objective: Vital Signs: BP (!) 157/110 (BP Location: Left Arm, Patient Position: Sitting, Cuff Size: Normal)   Pulse 89   Resp 14   Ht 5' 4.75 (1.645 m)   Wt 247 lb (112 kg)   LMP 02/25/2023   BMI 41.42 kg/m    Physical Exam Vitals and nursing note reviewed.  Constitutional:      Appearance: She is well-developed.  HENT:     Head: Normocephalic and atraumatic.  Eyes:     Conjunctiva/sclera: Conjunctivae normal.  Cardiovascular:     Rate and Rhythm: Normal rate and regular rhythm.     Heart sounds: Normal heart sounds.  Pulmonary:     Effort: Pulmonary effort is normal.     Breath sounds: Normal breath sounds.  Abdominal:     General: Bowel sounds are normal.     Palpations: Abdomen is soft.  Musculoskeletal:     Cervical back: Normal range of motion.  Lymphadenopathy:     Cervical: No cervical adenopathy.  Skin:    General: Skin is warm and dry.     Capillary Refill: Capillary refill takes less than 2 seconds.  Neurological:     Mental Status: She is alert and oriented to person, place, and time.  Psychiatric:        Behavior: Behavior normal.      Musculoskeletal Exam: C-spine has good ROM.  Painful ROM of lumbar spine.  Shoulder joints have good ROM with some discomfort with full ROM.  Elbow joints, wrist joints, MCPs,, PIPs, and DIPs good ROM with no synovitis.  Complete fist formation bilaterally.  Hip joints have good ROM with no groin pain.  Knee  joints have good ROM with no effusion.  Ankle joints have good ROM with no tenderness or joint swelling.   CDAI Exam: CDAI Score: -- Patient Global: --; Provider Global: -- Swollen: --; Tender: -- Joint Exam 03/21/2023   No joint exam has been documented for this visit   There is currently no information documented on the homunculus. Go to the Rheumatology activity and complete the homunculus joint exam.  Investigation: No additional findings.  Imaging: No results found.  Recent Labs: Lab Results  Component Value Date   WBC 5.9 01/13/2023   HGB 11.1 (L) 01/13/2023   PLT 198 01/13/2023   NA 140 01/13/2023   K 4.2 01/13/2023   CL 104 01/13/2023   CO2 28 01/13/2023   GLUCOSE 133 (H) 01/13/2023   BUN 9 01/13/2023   CREATININE 0.77 01/13/2023   BILITOT 0.5 01/13/2023   ALKPHOS  63 08/20/2021   AST 12 01/13/2023   ALT 8 01/13/2023   PROT 6.7 01/13/2023   ALBUMIN 3.3 (L) 08/20/2021   CALCIUM 8.7 01/13/2023   GFRAA 139 04/17/2020    Speciality Comments: PLQ eye exam: 08/31/2021 WNL @ [facility]. Follow up in 1 year.  Procedures:  No procedures performed Allergies: Fruit extracts, Latex, and Zinc    Assessment / Plan:     Visit Diagnoses: Seronegative rheumatoid arthritis (HCC) - U/S+ syonvitis Bilateral wrists/left MCP, RF-, CCP-, 14-3-3 eta negative: Patient continues to have chronic pain involving multiple joints.  She has been taking Plaquenil  200 mg 1 tablet by mouth twice daily.  No obvious synovitis was noted on examination today.  Initiated trial of gabapentin  after her last office visit for better management of myofascial pain.  She did not notice any clinical benefit while taking gabapentin  and will be discontinuing.  Plan to check sed rate and CRP today.  She will remain on Plaquenil  as prescribed.  She was advised to notify us  if she develops any new or worsening symptoms.  She will follow-up in the office in 5 months or sooner if needed.- Plan:  Sedimentation rate, C-reactive protein  High risk medication use - Plaquenil  200 mg 1 tablet by mouth twice daily. PLQ eye exam: 08/31/2021 WNL @ [facility].  Patient reports that she has had an updated Plaquenil  eye examination this year and is not yet due for an updated eye exam. Orders for CBC and CMP were released today. - Plan: COMPLETE METABOLIC PANEL WITH GFR, CBC with Differential/Platelet  Carpal tunnel syndrome, right upper limb: Not currently symptomatic.  Pain in both hands - X-rays of both hands were unremarkable on 02/25/2021.  No radiographic progression was noted when compared to x-rays from 2020.  No synovitis noted.  Plan to check sed rate and CRP today.  Chronic pain of right knee: No warmth or effusion noted today.  Patellofemoral disorder of left knee: No warmth or effusion noted today.  Spondylolysis, lumbar region: Chronic pain.  Under the care of pain management.  She has not had a recent injection in her lower back since the injections are only providing relief for up to 2 months at a time.  Chronic SI joint pain: Chronic pain.  Positive ANA (antinuclear antibody) -ANA positive 11/15/19.06/17/2020 ANA neg. 01/31/22: ANA negative.  Lab work from 01/31/22: ANA negative, dsDNA negative, complements WNL.  Lab work from 09/29/22: ESR 31, complements WNL, and dsDNA negative. The following lab work will be obtained today for further evaluation.  Plan: Protein / creatinine ratio, urine, COMPLETE METABOLIC PANEL WITH GFR, CBC with Differential/Platelet, Anti-DNA antibody, double-stranded, C3 and C4, Sedimentation rate, hydroxychloroquine  (PLAQUENIL ) 200 MG tablet, ANA  Mouth sores: She has been experiencing recurrent mouth sores especially when under stress.  The sores have been painful.  A prescription for Magic mouthwash was sent to the pharmacy today.  The following lab work will also be obtained for further evaluation.  She will notify us  if she develops any new or  worsening symptoms.  Family history of systemic lupus erythematosus - The following lab work will be obtained today for further evaluation.  Plan: Protein / creatinine ratio, urine, COMPLETE METABOLIC PANEL WITH GFR, CBC with Differential/Platelet, Anti-DNA antibody, double-stranded, C3 and C4, Sedimentation rate, hydroxychloroquine  (PLAQUENIL ) 200 MG tablet, ANA  Myofascial pain: Patient continues to experience chronic pain secondary to myofascial pain.  A trial of gabapentin  was initiated after her last office visit but she has  not noticed any clinical benefit and will be discontinuing.  She continues to take baclofen 10 mg 3 times daily as needed for muscle spasms.  Trapezius muscle spasm: She experiences muscle spasms intermittently and takes baclofen 10 mg 3 times daily as needed for muscle spasm relief.  Other fatigue: Chronic, related to insomnia.  Discussed the importance of regular exercise and good sleep hygiene.  Chronic pain syndrome: Patient mains under the care of pain management.  A trial of gabapentin  was initiated after her last office visit but she did not notice any clinical benefit and will be discontinuing.  She has a prescription for Norco which she takes as needed for pain relief.  Other medical conditions are listed as follows:   History of iron  deficiency anemia: Patient is taking ferrous sulfate  on a daily basis.  Vitamin D deficiency  Vitamin B12 deficiency  History of depression  Type 1 diabetes mellitus with diabetic polyneuropathy (HCC)  History of gastroesophageal reflux (GERD)  Diabetic gastroparesis (HCC)  History of asthma    Orders: Orders Placed This Encounter  Procedures   Protein / creatinine ratio, urine   COMPLETE METABOLIC PANEL WITH GFR   CBC with Differential/Platelet   Anti-DNA antibody, double-stranded   C3 and C4   Sedimentation rate   C-reactive protein   ANA   Meds ordered this encounter  Medications   hydroxychloroquine   (PLAQUENIL ) 200 MG tablet    Sig: TAKE 1 TABLET(200 MG) BY MOUTH TWICE DAILY    Dispense:  180 tablet    Refill:  0   DISCONTD: magic mouthwash (nystatin , hydrocortisone, diphenhydrAMINE ) suspension    Sig: Swish and spit 5 mLs 3 (three) times daily.    Dispense:  240 mL    Refill:  0    Follow-Up Instructions: Return in about 5 months (around 08/19/2023) for Rheumatoid arthritis.   Waddell CHRISTELLA Craze, PA-C  Note - This record has been created using Dragon software.  Chart creation errors have been sought, but may not always  have been located. Such creation errors do not reflect on  the standard of medical care.

## 2023-03-21 ENCOUNTER — Ambulatory Visit: Payer: BC Managed Care – PPO | Attending: Physician Assistant | Admitting: Physician Assistant

## 2023-03-21 ENCOUNTER — Other Ambulatory Visit: Payer: Self-pay | Admitting: Physician Assistant

## 2023-03-21 ENCOUNTER — Encounter: Payer: Self-pay | Admitting: Physician Assistant

## 2023-03-21 VITALS — BP 157/110 | HR 89 | Resp 14 | Ht 64.75 in | Wt 247.0 lb

## 2023-03-21 DIAGNOSIS — G5601 Carpal tunnel syndrome, right upper limb: Secondary | ICD-10-CM | POA: Diagnosis not present

## 2023-03-21 DIAGNOSIS — M62838 Other muscle spasm: Secondary | ICD-10-CM

## 2023-03-21 DIAGNOSIS — K3184 Gastroparesis: Secondary | ICD-10-CM

## 2023-03-21 DIAGNOSIS — M79642 Pain in left hand: Secondary | ICD-10-CM

## 2023-03-21 DIAGNOSIS — Z79899 Other long term (current) drug therapy: Secondary | ICD-10-CM | POA: Diagnosis not present

## 2023-03-21 DIAGNOSIS — R768 Other specified abnormal immunological findings in serum: Secondary | ICD-10-CM

## 2023-03-21 DIAGNOSIS — E559 Vitamin D deficiency, unspecified: Secondary | ICD-10-CM

## 2023-03-21 DIAGNOSIS — Z862 Personal history of diseases of the blood and blood-forming organs and certain disorders involving the immune mechanism: Secondary | ICD-10-CM

## 2023-03-21 DIAGNOSIS — Z8709 Personal history of other diseases of the respiratory system: Secondary | ICD-10-CM

## 2023-03-21 DIAGNOSIS — M79641 Pain in right hand: Secondary | ICD-10-CM | POA: Diagnosis not present

## 2023-03-21 DIAGNOSIS — M4306 Spondylolysis, lumbar region: Secondary | ICD-10-CM

## 2023-03-21 DIAGNOSIS — G8929 Other chronic pain: Secondary | ICD-10-CM

## 2023-03-21 DIAGNOSIS — M533 Sacrococcygeal disorders, not elsewhere classified: Secondary | ICD-10-CM

## 2023-03-21 DIAGNOSIS — M7918 Myalgia, other site: Secondary | ICD-10-CM

## 2023-03-21 DIAGNOSIS — G894 Chronic pain syndrome: Secondary | ICD-10-CM

## 2023-03-21 DIAGNOSIS — Z8659 Personal history of other mental and behavioral disorders: Secondary | ICD-10-CM

## 2023-03-21 DIAGNOSIS — Z8719 Personal history of other diseases of the digestive system: Secondary | ICD-10-CM

## 2023-03-21 DIAGNOSIS — K1379 Other lesions of oral mucosa: Secondary | ICD-10-CM

## 2023-03-21 DIAGNOSIS — E538 Deficiency of other specified B group vitamins: Secondary | ICD-10-CM

## 2023-03-21 DIAGNOSIS — M0600 Rheumatoid arthritis without rheumatoid factor, unspecified site: Secondary | ICD-10-CM | POA: Diagnosis not present

## 2023-03-21 DIAGNOSIS — E1042 Type 1 diabetes mellitus with diabetic polyneuropathy: Secondary | ICD-10-CM

## 2023-03-21 DIAGNOSIS — E1143 Type 2 diabetes mellitus with diabetic autonomic (poly)neuropathy: Secondary | ICD-10-CM

## 2023-03-21 DIAGNOSIS — R5383 Other fatigue: Secondary | ICD-10-CM

## 2023-03-21 DIAGNOSIS — M222X2 Patellofemoral disorders, left knee: Secondary | ICD-10-CM

## 2023-03-21 DIAGNOSIS — Z8269 Family history of other diseases of the musculoskeletal system and connective tissue: Secondary | ICD-10-CM

## 2023-03-21 DIAGNOSIS — M25561 Pain in right knee: Secondary | ICD-10-CM

## 2023-03-21 MED ORDER — HYDROXYCHLOROQUINE SULFATE 200 MG PO TABS: ORAL_TABLET | ORAL | 0 refills | Status: DC

## 2023-03-21 MED ORDER — NYSTATIN 100000 UNIT/ML MT SUSP: 5.0000 mL | Freq: Three times a day (TID) | OROMUCOSAL | 0 refills | Status: DC

## 2023-03-22 NOTE — Progress Notes (Signed)
 Protein creatinine ratio remains elevated.  Please notify the patient and forward results to nephrology.   Hemoglobin remains low but stable.  Rest of CBC stable.  ESR remains elevated. CRP WNL CMP WNL Complements WNL.

## 2023-03-23 LAB — COMPLETE METABOLIC PANEL WITH GFR
AG Ratio: 1.2 (calc) (ref 1.0–2.5)
ALT: 9 U/L (ref 6–29)
AST: 10 U/L (ref 10–30)
Albumin: 4.2 g/dL (ref 3.6–5.1)
Alkaline phosphatase (APISO): 70 U/L (ref 31–125)
BUN: 9 mg/dL (ref 7–25)
CO2: 25 mmol/L (ref 20–32)
Calcium: 9.5 mg/dL (ref 8.6–10.2)
Chloride: 102 mmol/L (ref 98–110)
Creat: 0.83 mg/dL (ref 0.50–0.97)
Globulin: 3.6 g/dL (ref 1.9–3.7)
Glucose, Bld: 80 mg/dL (ref 65–99)
Potassium: 4.4 mmol/L (ref 3.5–5.3)
Sodium: 137 mmol/L (ref 135–146)
Total Bilirubin: 0.7 mg/dL (ref 0.2–1.2)
Total Protein: 7.8 g/dL (ref 6.1–8.1)
eGFR: 97 mL/min/{1.73_m2} (ref >=60)

## 2023-03-23 LAB — C-REACTIVE PROTEIN: CRP: <3 mg/L (ref <8.0)

## 2023-03-23 LAB — SEDIMENTATION RATE: Sed Rate: 36 mm/h — ABNORMAL HIGH (ref 0–20)

## 2023-03-23 LAB — CBC WITH DIFFERENTIAL/PLATELET
Absolute Lymphocytes: 2576 {cells}/uL (ref 850–3900)
Absolute Monocytes: 448 {cells}/uL (ref 200–950)
Basophils Absolute: 63 {cells}/uL (ref 0–200)
Basophils Relative: 0.9 %
Eosinophils Absolute: 70 {cells}/uL (ref 15–500)
Eosinophils Relative: 1 %
HCT: 36.7 % (ref 35.0–45.0)
Hemoglobin: 11.3 g/dL — ABNORMAL LOW (ref 11.7–15.5)
MCH: 26.1 pg — ABNORMAL LOW (ref 27.0–33.0)
MCHC: 30.8 g/dL — ABNORMAL LOW (ref 32.0–36.0)
MCV: 84.8 fL (ref 80.0–100.0)
MPV: 12.7 fL — ABNORMAL HIGH (ref 7.5–12.5)
Monocytes Relative: 6.4 %
Neutro Abs: 3843 {cells}/uL (ref 1500–7800)
Neutrophils Relative %: 54.9 %
Platelets: 238 10*3/uL (ref 140–400)
RBC: 4.33 10*6/uL (ref 3.80–5.10)
RDW: 13 % (ref 11.0–15.0)
Total Lymphocyte: 36.8 %
WBC: 7 10*3/uL (ref 3.8–10.8)

## 2023-03-23 LAB — PROTEIN / CREATININE RATIO, URINE
Creatinine, Urine: 198 mg/dL (ref 20–275)
Protein/Creat Ratio: 1273 mg/g{creat} — ABNORMAL HIGH (ref 24–184)
Protein/Creatinine Ratio: 1.273 mg/mg{creat} — ABNORMAL HIGH (ref 0.024–0.184)
Total Protein, Urine: 252 mg/dL — ABNORMAL HIGH (ref 5–24)

## 2023-03-23 LAB — ANTI-DNA ANTIBODY, DOUBLE-STRANDED: ds DNA Ab: <1 [IU]/mL

## 2023-03-23 LAB — C3 AND C4
C3 Complement: 145 mg/dL (ref 83–193)
C4 Complement: 30 mg/dL (ref 15–57)

## 2023-03-23 LAB — ANTI-NUCLEAR AB-TITER (ANA TITER)
ANA TITER: 1:40 {titer} — ABNORMAL HIGH
ANA Titer 1: 1:80 {titer} — ABNORMAL HIGH

## 2023-03-23 LAB — ANA: Anti Nuclear Antibody (ANA): POSITIVE — AB

## 2023-03-23 NOTE — Progress Notes (Signed)
 Ok to schedule ultrasound to assess for active inflammation

## 2023-03-24 ENCOUNTER — Ambulatory Visit (INDEPENDENT_AMBULATORY_CARE_PROVIDER_SITE_OTHER): Payer: BC Managed Care – PPO

## 2023-03-24 ENCOUNTER — Ambulatory Visit: Payer: BC Managed Care – PPO | Attending: Rheumatology | Admitting: Rheumatology

## 2023-03-24 DIAGNOSIS — M79641 Pain in right hand: Secondary | ICD-10-CM

## 2023-03-24 DIAGNOSIS — M79642 Pain in left hand: Secondary | ICD-10-CM

## 2023-03-24 NOTE — Progress Notes (Signed)
 Visit diagnoses: Pain in both hands  Patient was here to get limited ultrasound examination of bilateral hands to evaluate for synovitis.  Ultrasound examination of bilateral hands was performed per EULAR recommendations. Using 15 MHz transducer, grayscale and power Doppler bilateral second and third MCP joints both dorsal and volar aspects were evaluated to look for synovitis or tenosynovitis. The findings were there was no synovitis or tenosynovitis on ultrasound examination.   Impression: No synovitis or tenosynovitis was noted on the limited ultrasound examination of the hands.  Maya Nash, MD

## 2023-03-24 NOTE — Progress Notes (Signed)
 ANA low positive.  dsDNA is negative--reassuring.  We will continue to monitor lab work closely.

## 2023-05-11 ENCOUNTER — Other Ambulatory Visit: Payer: BC Managed Care – PPO | Admitting: Rheumatology

## 2023-05-23 ENCOUNTER — Encounter: Payer: Self-pay | Admitting: Hematology and Oncology

## 2023-05-26 ENCOUNTER — Other Ambulatory Visit (HOSPITAL_BASED_OUTPATIENT_CLINIC_OR_DEPARTMENT_OTHER): Payer: Self-pay

## 2023-05-26 ENCOUNTER — Ambulatory Visit (HOSPITAL_BASED_OUTPATIENT_CLINIC_OR_DEPARTMENT_OTHER): Payer: BC Managed Care – PPO | Admitting: Internal Medicine

## 2023-05-26 DIAGNOSIS — R0609 Other forms of dyspnea: Secondary | ICD-10-CM | POA: Diagnosis not present

## 2023-05-26 LAB — PULMONARY FUNCTION TEST
FEF 25-75 Post: 3.91 L/s
FEF 25-75 Pre: 4.36 L/s
FEF2575-%Change-Post: -10 %
FEF2575-%Pred-Post: 113 %
FEF2575-%Pred-Pre: 127 %
FEV1-%Change-Post: -4 %
FEV1-%Pred-Post: 84 %
FEV1-%Pred-Pre: 88 %
FEV1-Post: 2.67 L
FEV1-Pre: 2.8 L
FEV1FVC-%Change-Post: -4 %
FEV1FVC-%Pred-Pre: 112 %
FEV6-%Change-Post: 0 %
FEV6-%Pred-Post: 79 %
FEV6-%Pred-Pre: 79 %
FEV6-Post: 2.96 L
FEV6-Pre: 2.96 L
FEV6FVC-%Pred-Post: 100 %
FEV6FVC-%Pred-Pre: 100 %
FVC-%Change-Post: 0 %
FVC-%Pred-Post: 78 %
FVC-%Pred-Pre: 78 %
FVC-Post: 2.96 L
FVC-Pre: 2.96 L
Post FEV1/FVC ratio: 90 %
Post FEV6/FVC ratio: 100 %
Pre FEV1/FVC ratio: 95 %
Pre FEV6/FVC Ratio: 100 %

## 2023-05-26 NOTE — Patient Instructions (Signed)
Spirometry pre/post performed today. 

## 2023-05-26 NOTE — Progress Notes (Signed)
Spirometry pre/post performed today. 

## 2023-06-16 ENCOUNTER — Ambulatory Visit: Payer: BC Managed Care – PPO | Admitting: Dietician

## 2023-07-15 ENCOUNTER — Emergency Department (HOSPITAL_COMMUNITY)
Admission: EM | Admit: 2023-07-15 | Discharge: 2023-07-15 | Disposition: A | Discharge status: Home / Self Care | Attending: Emergency Medicine | Admitting: Emergency Medicine

## 2023-07-15 ENCOUNTER — Encounter (HOSPITAL_COMMUNITY): Payer: Self-pay

## 2023-07-15 ENCOUNTER — Other Ambulatory Visit: Payer: Self-pay

## 2023-07-15 ENCOUNTER — Emergency Department (HOSPITAL_COMMUNITY)

## 2023-07-15 DIAGNOSIS — Z794 Long term (current) use of insulin: Secondary | ICD-10-CM | POA: Diagnosis not present

## 2023-07-15 DIAGNOSIS — E119 Type 2 diabetes mellitus without complications: Secondary | ICD-10-CM | POA: Diagnosis not present

## 2023-07-15 DIAGNOSIS — I602 Nontraumatic subarachnoid hemorrhage from anterior communicating artery: Secondary | ICD-10-CM | POA: Insufficient documentation

## 2023-07-15 DIAGNOSIS — Q0700 Arnold-Chiari syndrome without spina bifida or hydrocephalus: Secondary | ICD-10-CM | POA: Insufficient documentation

## 2023-07-15 DIAGNOSIS — R519 Headache, unspecified: Secondary | ICD-10-CM | POA: Diagnosis present

## 2023-07-15 DIAGNOSIS — I671 Cerebral aneurysm, nonruptured: Secondary | ICD-10-CM

## 2023-07-15 DIAGNOSIS — J45909 Unspecified asthma, uncomplicated: Secondary | ICD-10-CM | POA: Insufficient documentation

## 2023-07-15 DIAGNOSIS — I10 Essential (primary) hypertension: Secondary | ICD-10-CM | POA: Insufficient documentation

## 2023-07-15 DIAGNOSIS — G935 Compression of brain: Secondary | ICD-10-CM

## 2023-07-15 DIAGNOSIS — M542 Cervicalgia: Secondary | ICD-10-CM

## 2023-07-15 DIAGNOSIS — Z91040 Latex allergy status: Secondary | ICD-10-CM | POA: Insufficient documentation

## 2023-07-15 LAB — CBC
HCT: 37 % (ref 36.0–46.0)
Hemoglobin: 11.8 g/dL — ABNORMAL LOW (ref 12.0–15.0)
MCH: 27.2 pg (ref 26.0–34.0)
MCHC: 31.9 g/dL (ref 30.0–36.0)
MCV: 85.3 fL (ref 80.0–100.0)
Platelets: 259 10*3/uL (ref 150–400)
RBC: 4.34 MIL/uL (ref 3.87–5.11)
RDW: 13.1 % (ref 11.5–15.5)
WBC: 10.2 10*3/uL (ref 4.0–10.5)
nRBC: 0 % (ref 0.0–0.2)

## 2023-07-15 LAB — BASIC METABOLIC PANEL WITH GFR
Anion gap: 8 (ref 5–15)
BUN: 13 mg/dL (ref 6–20)
CO2: 24 mmol/L (ref 22–32)
Calcium: 8.7 mg/dL — ABNORMAL LOW (ref 8.9–10.3)
Chloride: 107 mmol/L (ref 98–111)
Creatinine, Ser: 0.8 mg/dL (ref 0.44–1.00)
GFR, Estimated: >60 mL/min (ref >60)
Glucose, Bld: 46 mg/dL — ABNORMAL LOW (ref 70–99)
Potassium: 3.3 mmol/L — ABNORMAL LOW (ref 3.5–5.1)
Sodium: 139 mmol/L (ref 135–145)

## 2023-07-15 LAB — CBG MONITORING, ED
Glucose-Capillary: 50 mg/dL — ABNORMAL LOW (ref 70–99)
Glucose-Capillary: 104 mg/dL — ABNORMAL HIGH (ref 70–99)

## 2023-07-15 LAB — HCG, SERUM, QUALITATIVE: Preg, Serum: NEGATIVE

## 2023-07-15 MED ORDER — IOHEXOL 350 MG/ML SOLN
100.0000 mL | Freq: Once | INTRAVENOUS | Status: AC | PRN
  Administered 2023-07-15: 100 mL via INTRAVENOUS

## 2023-07-15 MED ORDER — PROCHLORPERAZINE EDISYLATE 10 MG/2ML IJ SOLN
10.0000 mg | Freq: Once | INTRAMUSCULAR | Status: AC
  Administered 2023-07-15: 10 mg via INTRAVENOUS
  Filled 2023-07-15: qty 2

## 2023-07-15 MED ORDER — DEXAMETHASONE SODIUM PHOSPHATE 10 MG/ML IJ SOLN
10.0000 mg | Freq: Once | INTRAMUSCULAR | Status: AC
  Administered 2023-07-15: 10 mg via INTRAVENOUS
  Filled 2023-07-15: qty 1

## 2023-07-15 MED ORDER — METHOCARBAMOL 500 MG PO TABS: 500.0000 mg | ORAL_TABLET | Freq: Two times a day (BID) | ORAL | 0 refills | Status: AC

## 2023-07-15 MED ORDER — DIPHENHYDRAMINE HCL 50 MG/ML IJ SOLN
12.5000 mg | Freq: Once | INTRAMUSCULAR | Status: AC
  Administered 2023-07-15: 12.5 mg via INTRAVENOUS
  Filled 2023-07-15: qty 1

## 2023-07-15 MED ORDER — KETOROLAC TROMETHAMINE 15 MG/ML IJ SOLN
15.0000 mg | Freq: Once | INTRAMUSCULAR | Status: AC
  Administered 2023-07-15: 15 mg via INTRAVENOUS
  Filled 2023-07-15: qty 1

## 2023-07-15 NOTE — ED Triage Notes (Addendum)
 Patient has a headache that is causing her neck pain. Stretched and felt a pop in her neck and has had pain since Thursday. Urgent care stated she might have a pinched nerve. Tried muscle relaxers but stated the pain is still there. Sensitive to move neck around. Sent for MRI.

## 2023-07-15 NOTE — ED Provider Notes (Signed)
 [place] [facility] DEPARTMENT AT [facility] Provider Note   CSN: [id number] Arrival date & time: 07/15/23  1304     History  Chief Complaint  Patient presents with   Headache    [name] is a 33 y.o. female.  Patient with history of diabetes, asthma, hypertension presents today with complaints of headache and neck pain.  She states that same occurred the Thursday before last when she was stretching and felt a pop in her neck followed by significant pain in this area on the left side of her neck.  She states the pain radiates into her head and down into her left shoulder.  Denies any weakness in her arms or numbness/tingling.  States she has been trying to manage her symptoms with over-the-counter pain medication, however she continues to have significant discomfort.  She has tried muscle relaxers and hydrocodone  without any relief.  She is unable to move her neck without significant discomfort.  Denies any history of similar complaints previously.  She went to urgent care and had an x-ray that was normal and was recommended to come here for advanced imaging.  She presents for same.  She denies any vision changes, nausea, or vomiting.  No photophobia or phonophobia.  The history is provided by the patient. No language interpreter was used.  Headache      Home Medications Prior to Admission medications   Medication Sig Start Date End Date Taking? Authorizing Provider  albuterol  (PROVENTIL  HFA;VENTOLIN  HFA) 108 (90 Base) MCG/ACT inhaler Inhale 2 puffs into the lungs every 4 (four) hours as needed for wheezing.    [provider]  ALPRAZolam (XANAX) 0.5 MG tablet Take by mouth. 02/09/23   [provider]  baclofen (LIORESAL) 10 MG tablet Take 10 mg by mouth 3 (three) times daily as needed for muscle spasms. 07/12/19   [provider]  diclofenac  Sodium (VOLTAREN ) 1 % GEL Apply 2-4 grams to affected joint 4 times daily as needed. 09/29/22   Romayne Clubs, PA-C  ergocalciferol (VITAMIN D2) 1.25 MG (50000 UT) capsule Take 50,000 Units by mouth every Thursday.    [provider]  ferrous sulfate  325 (65 FE) MG EC tablet Take 1 tablet (325 mg total) by mouth 2 (two) times daily with a meal. 12/30/21   Iruku, Bernis Brisker, MD  FLUoxetine  (PROZAC ) 20 MG capsule Take 20 mg by mouth daily. 02/22/23   [provider]  FLUoxetine  (PROZAC ) 40 MG capsule Take 40 mg by mouth daily. 12/23/22   [provider]  gabapentin  (NEURONTIN ) 300 MG capsule Take 1 capsule (300 mg total) by mouth at bedtime. Patient not taking: Reported on 03/21/2023 01/13/23   Romayne Clubs, PA-C  HUMALOG 100 UNIT/ML injection Inject 100 Units as directed See admin instructions. Inject up to 100 units per day via insulin  pump as directed 02/06/18   [provider]  HYDROcodone -acetaminophen  (NORCO/VICODIN) 5-325 MG tablet Take 1 tablet by mouth every 6 (six) hours as needed for moderate pain or severe pain.    [provider]  hydroxychloroquine  (PLAQUENIL ) 200 MG tablet TAKE 1 TABLET(200 MG) BY MOUTH TWICE DAILY 03/21/23   Romayne Clubs, PA-C  Lactobacillus (PROBIOTIC ACIDOPHILUS) TABS Take 1 tablet by mouth daily. Patient not taking: Reported on 01/13/2023    [provider]  losartan (COZAAR) 50 MG tablet Take 100 mg by mouth daily. 07/08/22   [provider]  magic mouthwash (nystatin , hydrocortisone, diphenhydrAMINE ) suspension Swish and spit 5 mLs  3 (three) times daily. 03/21/23   Romayne Clubs, PA-C  ondansetron  (ZOFRAN  ODT) 4 MG disintegrating tablet Take 1 tablet (4 mg total) by mouth every 8 (eight) hours as needed for up to 15 doses for nausea or vomiting. Patient not taking: Reported on 09/29/2022 07/23/20   Arvilla Birmingham, MD  pantoprazole (PROTONIX) 40 MG tablet Take 40 mg by mouth daily. Patient not taking: Reported on 09/29/2022 11/01/21   [provider]  prazosin  (MINIPRESS ) 2 MG capsule Take 2 mg  by mouth at bedtime as needed (sleep). 03/06/20   [provider]  propranolol (INDERAL) 20 MG tablet Take 20 mg by mouth daily. Patient not taking: Reported on 03/21/2023 12/23/22   [provider]  traZODone  (DESYREL ) 50 MG tablet Take 100 mg by mouth at bedtime as needed for sleep. 03/06/20   [provider]  triamcinolone  cream (KENALOG ) 0.1 % Apply 1 Application topically as needed (dry, itch skin). Apply 1-2 times a day to affected areas of skin for up to two weeks as needed. 01/31/22   Romayne Clubs, PA-C      Allergies    Fruit extracts, Latex, and Zinc    Review of Systems   Review of Systems  Neurological:  Positive for headaches.  All other systems reviewed and are negative.   Physical Exam Updated Vital Signs BP (!) 161/109 (BP Location: Left Arm) Comment: pt stated did not take her BP meds today  Pulse (!) 113   Temp 98.3 F (36.8 C) (Tympanic)   Resp 18   Ht 5\' 4"  (1.626 m)   Wt 116.1 kg   SpO2 100%   BMI 43.94 kg/m  Physical Exam Vitals and nursing note reviewed.  Constitutional:      General: She is not in acute distress.    Appearance: Normal appearance. She is normal weight. She is not ill-appearing, toxic-appearing or diaphoretic.  HENT:     Head: Normocephalic and atraumatic.  Cardiovascular:     Rate and Rhythm: Normal rate.  Pulmonary:     Effort: Pulmonary effort is normal. No respiratory distress.  Musculoskeletal:        General: Normal range of motion.     Cervical back: Normal range of motion.     Comments: Palpable muscle tightness and tenderness noted to the left neck and into the left shoulder area.  No midline cervical, thoracic, or lumbar spine tenderness to palpation.  No step-offs, lesions, deformity, or overlying skin changes.  Skin:    General: Skin is warm and dry.  Neurological:     General: No focal deficit present.     Mental Status: She is alert and oriented to person, place, and time.     GCS: GCS eye  subscore is 4. GCS verbal subscore is 5. GCS motor subscore is 6.     Sensory: Sensation is intact.     Motor: Motor function is intact.     Coordination: Coordination is intact.     Gait: Gait is intact.     Comments: Alert and oriented to self, place, time and event.    Speech is fluent, clear without dysarthria or dysphasia.    Strength 5/5 in upper/lower extremities   Sensation intact in upper/lower extremities    CN I not tested  CN II grossly intact visual fields bilaterally. Did not visualize posterior eye.  CN III, IV, VI PERRLA and EOMs intact bilaterally  CN V Intact sensation to sharp and light touch to  the face  CN VII facial movements symmetric  CN VIII not tested  CN IX, X no uvula deviation, symmetric rise of soft palate  CN XI 5/5 SCM and trapezius strength bilaterally  CN XII Midline tongue protrusion, symmetric L/R movements   Psychiatric:        Mood and Affect: Mood normal.        Behavior: Behavior normal.     ED Results / Procedures / Treatments   Labs (all labs ordered are listed, but only abnormal results are displayed) Labs Reviewed  CBC - Abnormal; Notable for the following components:      Result Value   Hemoglobin 11.8 (*)    All other components within normal limits  BASIC METABOLIC PANEL WITH GFR - Abnormal; Notable for the following components:   Potassium 3.3 (*)    Glucose, Bld 46 (*)    Calcium 8.7 (*)    All other components within normal limits  CBG MONITORING, ED - Abnormal; Notable for the following components:   Glucose-Capillary 50 (*)    All other components within normal limits  CBG MONITORING, ED - Abnormal; Notable for the following components:   Glucose-Capillary 104 (*)    All other components within normal limits  HCG, SERUM, QUALITATIVE    EKG None  Radiology MR BRAIN WO CONTRAST Result Date: 07/15/2023 CLINICAL DATA:  Initial evaluation for acute headache, neuro deficit. EXAM: MRI HEAD WITHOUT CONTRAST MRA HEAD  WITHOUT CONTRAST TECHNIQUE: Multiplanar, multi-echo pulse sequences of the brain and surrounding structures were acquired without intravenous contrast. Angiographic images of the Circle of Willis were acquired using MRA technique without intravenous contrast. COMPARISON:  Prior CT from earlier the same day. FINDINGS: MRI HEAD FINDINGS Brain: Cerebral volume within normal limits for age. No focal parenchymal signal abnormality. No abnormal foci of restricted diffusion to suggest acute or subacute ischemia. Gray-white matter differentiation well maintained. No encephalomalacia to suggest chronic cortical infarction or other insult. No foci of susceptibility artifact indicative of acute or chronic intracranial blood products. No mass lesion, midline shift or mass effect. Ventricles normal in size and morphology without hydrocephalus. No extra-axial fluid collection. Pituitary gland and suprasellar region within normal limits. Vascular: Major intracranial vascular flow voids are well maintained. Skull and upper cervical spine: Cerebellar tonsils are beaked and low lying, extending up to 14 mm below the foramen magnum, consistent with Chiari 1 malformation. Visualized upper cervical spinal cord within normal limits without syrinx. Bone marrow signal intensity normal. No scalp soft tissue abnormality. Sinuses/Orbits: Globes and orbital soft tissues are within normal limits. Mild scattered mucosal thickening noted about the ethmoidal air cells. Paranasal sinuses are otherwise clear. No significant mastoid effusion. Other: None. MRA HEAD FINDINGS Anterior circulation: Both internal carotid arteries are patent through the siphons without stenosis or other abnormality. No filling defect or other abnormality seen at the previously questioned linear defect at the petro cavernous left ICA on prior CTA. This was consistent with artifact. A1 segments patent bilaterally, with the left slightly dominant. 2-3 mm outpouching extending  posteriorly from the left aspect of the anterior communicating artery complex, consistent with a small aneurysm (series 7, image 66). Both ACAs patent without stenosis. No M1 stenosis or occlusion. Distal MCA branches perfused and symmetric. Posterior circulation: Visualized distal V4 segments patent without stenosis. Neither PICA origin well visualized on this exam. Basilar diminutive but patent without stenosis. Superior cerebral arteries patent bilaterally. Left PCA supplied via the basilar as well as a prominent left posterior  communicating artery. Fetal type origin of the right PCA. Both PCAs patent to their distal aspects without stenosis. Anatomic variants: As above. IMPRESSION: MRI HEAD: 1. No acute intracranial abnormality. 2. Chiari 1 malformation, with the cerebellar tonsils extending up to 14 mm below the foramen magnum. 3. Otherwise normal brain MRI. MRA HEAD: 1. Negative intracranial MRA for large vessel occlusion or other emergent finding. No hemodynamically significant or correctable stenosis. Previously questioned abnormality at the cavernous left ICA was consistent with artifact. 2. 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 Electronically Signed   By: Virgia Griffins M.D.   On: 07/15/2023 18:49   MR ANGIO HEAD WO CONTRAST Result Date: 07/15/2023 CLINICAL DATA:  Initial evaluation for acute headache, neuro deficit. EXAM: MRI HEAD WITHOUT CONTRAST MRA HEAD WITHOUT CONTRAST TECHNIQUE: Multiplanar, multi-echo pulse sequences of the brain and surrounding structures were acquired without intravenous contrast. Angiographic images of the Circle of Willis were acquired using MRA technique without intravenous contrast. COMPARISON:  Prior CT from earlier the same day. FINDINGS: MRI HEAD FINDINGS Brain: Cerebral volume within normal limits for age. No focal parenchymal signal abnormality. No abnormal foci of restricted diffusion to suggest acute or subacute ischemia. Gray-white matter differentiation well maintained.  No encephalomalacia to suggest chronic cortical infarction or other insult. No foci of susceptibility artifact indicative of acute or chronic intracranial blood products. No mass lesion, midline shift or mass effect. Ventricles normal in size and morphology without hydrocephalus. No extra-axial fluid collection. Pituitary gland and suprasellar region within normal limits. Vascular: Major intracranial vascular flow voids are well maintained. Skull and upper cervical spine: Cerebellar tonsils are beaked and low lying, extending up to 14 mm below the foramen magnum, consistent with Chiari 1 malformation. Visualized upper cervical spinal cord within normal limits without syrinx. Bone marrow signal intensity normal. No scalp soft tissue abnormality. Sinuses/Orbits: Globes and orbital soft tissues are within normal limits. Mild scattered mucosal thickening noted about the ethmoidal air cells. Paranasal sinuses are otherwise clear. No significant mastoid effusion. Other: None. MRA HEAD FINDINGS Anterior circulation: Both internal carotid arteries are patent through the siphons without stenosis or other abnormality. No filling defect or other abnormality seen at the previously questioned linear defect at the petro cavernous left ICA on prior CTA. This was consistent with artifact. A1 segments patent bilaterally, with the left slightly dominant. 2-3 mm outpouching extending posteriorly from the left aspect of the anterior communicating artery complex, consistent with a small aneurysm (series 7, image 66). Both ACAs patent without stenosis. No M1 stenosis or occlusion. Distal MCA branches perfused and symmetric. Posterior circulation: Visualized distal V4 segments patent without stenosis. Neither PICA origin well visualized on this exam. Basilar diminutive but patent without stenosis. Superior cerebral arteries patent bilaterally. Left PCA supplied via the basilar as well as a prominent left posterior communicating artery.  Fetal type origin of the right PCA. Both PCAs patent to their distal aspects without stenosis. Anatomic variants: As above. IMPRESSION: MRI HEAD: 1. No acute intracranial abnormality. 2. Chiari 1 malformation, with the cerebellar tonsils extending up to 14 mm below the foramen magnum. 3. Otherwise normal brain MRI. MRA HEAD: 1. Negative intracranial MRA for large vessel occlusion or other emergent finding. No hemodynamically significant or correctable stenosis. Previously questioned abnormality at the cavernous left ICA was consistent with artifact. 2. 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 Electronically Signed   By: Virgia Griffins M.D.   On: 07/15/2023 18:49   CT ANGIO HEAD NECK W WO CM Result Date: 07/15/2023 CLINICAL DATA:  33 year old female with sudden severe headache. Neck pain, "felt a pop". Persistent pain for 2 days. EXAM: CT ANGIOGRAPHY HEAD AND NECK WITH AND WITHOUT CONTRAST TECHNIQUE: Multidetector CT imaging of the head and neck was performed using the standard protocol during bolus administration of intravenous contrast. Multiplanar CT image reconstructions and MIPs were obtained to evaluate the vascular anatomy. Carotid stenosis measurements (when applicable) are obtained utilizing NASCET criteria, using the distal internal carotid diameter as the denominator. RADIATION DOSE REDUCTION: This exam was performed according to the departmental dose-optimization program which includes automated exposure control, adjustment of the mA and/or kV according to patient size and/or use of iterative reconstruction technique. CONTRAST:  OMNIPAQUE  IOHEXOL  350 MG/ML SOLN COMPARISON:  Head, cervical spine, and face CT 10/07/2020. FINDINGS: CT HEAD Brain: Normal cerebral volume. No midline shift, ventriculomegaly, mass effect, evidence of mass lesion, intracranial hemorrhage or evidence of cortically based acute infarction. Gray-white matter differentiation is within normal limits throughout the brain. Calvarium and  skull base: Intact. Paranasal sinuses: Visualized paranasal sinuses and mastoids are stable and well aerated. Orbits: Orbit and scalp soft tissues are within normal limits. CTA NECK Skeleton: No osseous abnormality identified. Upper chest: Negative. Other neck: Partially retropharyngeal course of the right carotid (normal variant). Nonvascular neck soft tissue spaces are within normal limits. Aortic arch: Normal 3 vessel arch. Right carotid system: Partially retropharyngeal course, mildly tortuous right ICA distal to the bulb, otherwise normal. Left carotid system: Normal. Vertebral arteries: Proximal right subclavian artery and right vertebral artery origin are normal. Paravertebral venous contrast reflux incidentally noted, visible right V1 segment appears to remain normal. Right V2 and V3 segments are patent and within normal limits. Proximal left subclavian artery and left vertebral artery origin are normal. Paravertebral venous contrast reflux, but the left vertebral artery appears codominant, patent, normal to the skull base. CTA HEAD Posterior circulation: Patent distal vertebral arteries and vertebrobasilar junction with tortuosity, distal left vertebral diminutive beyond the left PICA which is patent with a tortuous origin. No distal vertebral plaque. Diminutive but patent basilar artery. No basilar stenosis. SCA origins and diminutive left P1 segment are patent. Fetal PCA origins more so the right. Bilateral PCA branches are within normal limits. Anterior circulation: Both ICA siphons are patent. Left petrous segment unusual linear, diagonal filling defect on series 14, image 122 and series 16, image 108. This might be artifact. Proximal and distal to that level the left ICA is within normal limits. Normal left posterior communicating artery origin. Contralateral right ICA siphon is patent and within normal limits. Early right cavernous sinus enhancement is felt to be physiologic. Normal right posterior  communicating artery origin. Patent carotid termini, MCA and ACA origins. Tortuous A1 segments, the left is dominant. Ectatic anterior communicating artery without discrete aneurysm. Bilateral ACA branches are within normal limits. MCA M1 segments and bifurcations are patent without stenosis. Bilateral MCA branches are within normal limits. Venous sinuses: Patent. Anatomic variants: Diminutive vertebrobasilar system which seems to beyond the basis of fetal PCA origins. Dominant distal right V4 segment. Dominant left ACA A1. Review of the MIP images confirms the above findings IMPRESSION: 1. Intracranial CTA is negative for large vessel occlusion or intracranial aneurysm, but Positive for a short segment unusual linear filling defect in the petrous portion of the Left ICA siphon (series 14, image 122). This is indeterminate for artifact. If there are any right side neurologic deficits then follow-up noncontrast Head MRI and Head MRA would be recommended. 2. Negative CTA Neck. 3.  Normal CT appearance of the Brain. Electronically Signed   By: Marlise Simpers M.D.   On: 07/15/2023 16:22    Procedures Procedures    Medications Ordered in ED Medications - No data to display  ED Course/ Medical Decision Making/ A&P                                 Medical Decision Making Amount and/or Complexity of Data Reviewed Labs: ordered. Radiology: ordered.  Risk Prescription drug management.   This patient is a 33 y.o. female who presents to the ED for concern of headache, neck pain, this involves an extensive number of treatment options, and is a complaint that carries with it a high risk of complications and morbidity. The emergent differential diagnosis prior to evaluation includes, but is not limited to,  Stroke, increased ICP, meningitis, CVA, intracranial tumor, venous sinus thrombosis, migraine, cluster headache, hypertension, drug related, head injury, tension headache, sinusitis, dental abscess, otitis media,  TMJ, temporal arteritis, glaucoma, trigeminal neuralgia.   This is not an exhaustive differential.   Past Medical History / Co-morbidities / Social History:  has a past medical history of Anemia, Anxiety, Arthritis, Asthma, Diabetes mellitus without complication (HCC), Dysrhythmia, GERD (gastroesophageal reflux disease), Headache, HLD (hyperlipidemia), Hypertension, Neuromuscular disorder (HCC) (2015), and Vitamin D deficiency.  Additional history: Chart reviewed.  Physical Exam: Physical exam performed. The pertinent findings include: Per above, patient alert and oriented and neurologically intact without focal deficits.  Does have significant left-sided neck pain into her left shoulder with no midline tenderness, step-offs, lesions, or deformity.  Lab Tests: I ordered, and personally interpreted labs.  The pertinent results include: No leukocytosis, Hgb 11.8 consistent with previous. K 3.3, glucose 46 --> 50 --> 104  Patient reports that she accidentally took her insulin  and did not eat anything, she turned off her pump and was given apple juice with improvement   Imaging Studies: I ordered imaging studies including CTA head and neck, MRI, MRA. I independently visualized and interpreted imaging which showed   CTA:  1. Intracranial CTA is negative for large vessel occlusion or intracranial aneurysm, but Positive for a short segment unusual linear filling defect in the petrous portion of the Left ICA siphon (series 14, image 122). This is indeterminate for artifact. If there are any right side neurologic deficits then follow-up noncontrast Head MRI and Head MRA would be recommended. 2. Negative CTA Neck. 3.  Normal CT appearance of the Brain.  MRI:  1. No acute intracranial abnormality. 2. Chiari 1 malformation, with the cerebellar tonsils extending up to 14 mm below the foramen magnum. 3. Otherwise normal brain MRI.  MRA:  1. Negative intracranial MRA for large vessel occlusion  or other emergent finding. No hemodynamically significant or correctable stenosis. Previously questioned abnormality at the cavernous left ICA was consistent with artifact. 2. 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  I agree with the radiologist interpretation.   Medications: I ordered medication including Compazine , Benadryl , Decadron , Toradol for headache. Reevaluation of the patient after these medicines showed that the patient improved. I have reviewed the patients home medicines and have made adjustments as needed.  Consultations Obtained: I requested consultation with the neurosurgery on-call Dr. Nat Badger,  and discussed lab and imaging findings as well as pertinent plan - they recommend: Outpatient neurosurgery follow-up   Disposition: After consideration of the diagnostic results and the patients response to treatment, I feel that emergency department workup  does not suggest an emergent condition requiring admission or immediate intervention beyond what has been performed at this time. The plan is: Discharge with neurosurgery follow-up, Robaxin  for pain, close outpatient follow-up and return precautions.  Informed her of the findings on her MRI with recommendations discussed as well.  Recommend NSAIDs and Tylenol  as pain is likely muscular. Evaluation and diagnostic testing in the emergency department does not suggest an emergent condition requiring admission or immediate intervention beyond what has been performed at this time.  Plan for discharge with close PCP follow-up.  Patient is understanding and amenable with plan, educated on red flag symptoms that would prompt immediate return.  Patient discharged in stable condition.  I discussed this case with my attending physician Dr. Leighton Punches who cosigned this note including patient's presenting symptoms, physical exam, and planned diagnostics and interventions. Attending physician stated agreement with plan or made changes to plan which were implemented.     Final Clinical Impression(s) / ED Diagnoses Final diagnoses:  Acute nonintractable headache, unspecified headache type  Neck pain  Chiari malformation type I (HCC)  Anterior communicating artery aneurysm    Rx / DC Orders ED Discharge Orders          Ordered    methocarbamol  (ROBAXIN ) 500 MG tablet  2 times daily        07/15/23 2015          An After Visit Summary was printed and given to the patient.     Fredna Jasper 07/15/23 2016    Lind Repine, MD 07/16/23 [phone]

## 2023-07-15 NOTE — Discharge Instructions (Addendum)
 As we discussed, your workup in the ER today was overall reassuring for acute findings.  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  This is likely unrelated to your symptoms.  However, I did discuss this with neurosurgery and they would like to see you in the office.  I have given you a referral with a number to call to schedule an appointment.  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  This is also likely an incidental finding and of no emergent concern.  You can discuss this with neurosurgery as well.  I suspect that your pain is muscular in nature.  I have given you a prescription for a muscle relaxer that you can take as prescribed as needed.  Please do not drive or operate heavy machinery while taking this medication as it can be sedating.  Please use acetaminophen  (Tylenol ) or ibuprofen (Advil, Motrin) for pain.  You may use 800 mg ibuprofen every 6 hours or 1000 mg of acetaminophen  every 6 hours.  You may choose to alternate between the two, this would be most effective. Do not exceed 4000 mg of acetaminophen  within 24 hours.  Do not exceed 3200 mg ibuprofen within 24 hours.  Please call your primary care provider for follow-up as well.  Return if development of any new or worsening symptoms.

## 2023-08-02 ENCOUNTER — Other Ambulatory Visit: Payer: Self-pay | Admitting: *Deleted

## 2023-08-02 MED ORDER — TRIAMCINOLONE ACETONIDE 0.1 % EX CREA: 1.0000 | TOPICAL_CREAM | Freq: Two times a day (BID) | CUTANEOUS | 0 refills | Status: AC | PRN

## 2023-08-02 NOTE — Telephone Encounter (Signed)
 Last Fill: 01/31/2022  Next Visit: 09/07/2023  Last Visit: 03/21/2023  Dx: Seronegative rheumatoid arthritis   Current Dose per office note on 03/21/2023: not discussed  Okay to refill Triamcinolone  Cream?

## 2023-08-26 NOTE — Progress Notes (Deleted)
 Office Visit Note  Patient: [name]             Date of Birth: 22-Dec-1990           MRN: [id number]             PCP: Gordy Lauber, MD Referring: Olin Bertin, MD Visit Date: 09/07/2023 Occupation: @GUAROCC @  Subjective:  No chief complaint on file.   History of Present Illness: [name] is a 33 y.o. female ***     Activities of Daily Living:  Patient reports morning stiffness for *** {minute/hour:19697}.   Patient {ACTIONS;DENIES/REPORTS:21021675::"Denies"} nocturnal pain.  Difficulty dressing/grooming: {ACTIONS;DENIES/REPORTS:21021675::"Denies"} Difficulty climbing stairs: {ACTIONS;DENIES/REPORTS:21021675::"Denies"} Difficulty getting out of chair: {ACTIONS;DENIES/REPORTS:21021675::"Denies"} Difficulty using hands for taps, buttons, cutlery, and/or writing: {ACTIONS;DENIES/REPORTS:21021675::"Denies"}  No Rheumatology ROS completed.   PMFS History:  Patient Active Problem List   Diagnosis Date Noted   IDA (iron  deficiency anemia) 11/06/2020   Intractable abdominal pain 07/26/2020   Right upper quadrant abdominal pain 07/26/2020   Acute pyelonephritis 07/26/2020   Diabetes mellitus type 1, controlled, with complications (HCC) 07/26/2020   Obesity, Class III, BMI 40-49.9 (morbid obesity) 07/26/2020   Enterococcus faecalis infection 07/26/2020   Essential hypertension 07/26/2020   RUQ abdominal pain 07/26/2020   Chronic low back pain 11/22/2013   Clinical depression 10/07/2013   Extreme obesity 10/07/2013   Diabetic neuropathy (HCC) 08/06/2013   General patient noncompliance 12/21/2012   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HCC) 12/30/2010   Appetite disorder 12/30/2010   BP (high blood pressure) 12/30/2010   Muscle ache 12/30/2010   Cachectic (HCC) 12/30/2010   Patellofemoral disorder of left knee 09/03/2010   Diabetes mellitus (HCC) 09/03/2010    Past Medical History:  Diagnosis Date   Anemia    Anxiety    Arthritis    Asthma    asthma as a child, no problems  as an adult   Diabetes mellitus without complication (HCC)    type1 - insulin  pump   Dysrhythmia    GERD (gastroesophageal reflux disease)    Headache    HLD (hyperlipidemia)    diet controlled   Hypertension    no meds   Neuromuscular disorder (HCC) 2015   periph. neuropathy- no current problems   Vitamin D deficiency     Family History  Problem Relation Age of Onset   Kidney disease Mother    Asthma Sister    ADD / ADHD Sister    Lupus Maternal Grandmother    Past Surgical History:  Procedure Laterality Date   CATARACT EXTRACTION, BILATERAL Bilateral 2022   CHOLECYSTECTOMY N/A 11/11/2021   Procedure: LAPAROSCOPIC CHOLECYSTECTOMY;  Surgeon: Shela Derby, MD;  Location: [facility] OR;  Service: General;  Laterality: N/A;   GASTRIC BYPASS  07/2017   WISDOM TOOTH EXTRACTION     Social History   Social History Narrative   Not on file   Immunization History  Administered Date(s) Administered   Influenza Inj Mdck Quad Pf 02/11/2018   PFIZER(Purple Top)SARS-COV-2 Vaccination 05/28/2019, 06/18/2019, 03/06/2020, 09/13/2020   Pneumococcal Polysaccharide-23 10/07/2013     Objective: Vital Signs: There were no vitals taken for this visit.   Physical Exam   Musculoskeletal Exam: ***  CDAI Exam: CDAI Score: -- Patient Global: --; Provider Global: -- Swollen: --; Tender: -- Joint Exam 09/07/2023   No joint exam has been documented for this visit   There is currently no information documented on the homunculus. Go to the Rheumatology activity and complete the homunculus joint exam.  Investigation: No additional  findings.  Imaging: No results found.  Recent Labs: Lab Results  Component Value Date   WBC 10.2 07/15/2023   HGB 11.8 (L) 07/15/2023   PLT 259 07/15/2023   NA 139 07/15/2023   K 3.3 (L) 07/15/2023   CL 107 07/15/2023   CO2 24 07/15/2023   GLUCOSE 46 (L) 07/15/2023   BUN 13 07/15/2023   CREATININE 0.80 07/15/2023   BILITOT 0.7 03/21/2023   ALKPHOS 63  08/20/2021   AST 10 03/21/2023   ALT 9 03/21/2023   PROT 7.8 03/21/2023   ALBUMIN 3.3 (L) 08/20/2021   CALCIUM 8.7 (L) 07/15/2023   GFRAA 139 04/17/2020    Speciality Comments: PLQ eye exam: 08/31/2021 WNL @ [facility]. Follow up in 1 year.  Procedures:  No procedures performed Allergies: Fruit extracts, Latex, and Zinc   Assessment / Plan:     Visit Diagnoses: No diagnosis found.  Orders: No orders of the defined types were placed in this encounter.  No orders of the defined types were placed in this encounter.   Face-to-face time spent with patient was *** minutes. Greater than 50% of time was spent in counseling and coordination of care.  Follow-Up Instructions: No follow-ups on file.   Nicholas Bari, MD  Note - This record has been created using Animal nutritionist.  Chart creation errors have been sought, but may not always  have been located. Such creation errors do not reflect on  the standard of medical care.

## 2023-09-06 NOTE — Progress Notes (Deleted)
 Office Visit Note  Patient: [name]             Date of Birth: 08-06-1990           MRN: [id number]             PCP: Gordy Lauber, MD Referring: Olin Bertin, MD Visit Date: 09/20/2023 Occupation: @GUAROCC @  Subjective:  No chief complaint on file.   History of Present Illness: [name] is a 33 y.o. female ***     Activities of Daily Living:  Patient reports morning stiffness for *** {minute/hour:19697}.   Patient {ACTIONS;DENIES/REPORTS:21021675::Denies} nocturnal pain.  Difficulty dressing/grooming: {ACTIONS;DENIES/REPORTS:21021675::Denies} Difficulty climbing stairs: {ACTIONS;DENIES/REPORTS:21021675::Denies} Difficulty getting out of chair: {ACTIONS;DENIES/REPORTS:21021675::Denies} Difficulty using hands for taps, buttons, cutlery, and/or writing: {ACTIONS;DENIES/REPORTS:21021675::Denies}  No Rheumatology ROS completed.   PMFS History:  Patient Active Problem List   Diagnosis Date Noted   IDA (iron  deficiency anemia) 11/06/2020   Intractable abdominal pain 07/26/2020   Right upper quadrant abdominal pain 07/26/2020   Acute pyelonephritis 07/26/2020   Diabetes mellitus type 1, controlled, with complications (HCC) 07/26/2020   Obesity, Class III, BMI 40-49.9 (morbid obesity) 07/26/2020   Enterococcus faecalis infection 07/26/2020   Essential hypertension 07/26/2020   RUQ abdominal pain 07/26/2020   Chronic low back pain 11/22/2013   Clinical depression 10/07/2013   Extreme obesity 10/07/2013   Diabetic neuropathy (HCC) 08/06/2013   General patient noncompliance 12/21/2012   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HCC) 12/30/2010   Appetite disorder 12/30/2010   BP (high blood pressure) 12/30/2010   Muscle ache 12/30/2010   Cachectic (HCC) 12/30/2010   Patellofemoral disorder of left knee 09/03/2010   Diabetes mellitus (HCC) 09/03/2010    Past Medical History:  Diagnosis Date   Anemia    Anxiety    Arthritis    Asthma    asthma as a child, no problems  as an adult   Diabetes mellitus without complication (HCC)    type1 - insulin  pump   Dysrhythmia    GERD (gastroesophageal reflux disease)    Headache    HLD (hyperlipidemia)    diet controlled   Hypertension    no meds   Neuromuscular disorder (HCC) 2015   periph. neuropathy- no current problems   Vitamin D deficiency     Family History  Problem Relation Age of Onset   Kidney disease Mother    Asthma Sister    ADD / ADHD Sister    Lupus Maternal Grandmother    Past Surgical History:  Procedure Laterality Date   CATARACT EXTRACTION, BILATERAL Bilateral 2022   CHOLECYSTECTOMY N/A 11/11/2021   Procedure: LAPAROSCOPIC CHOLECYSTECTOMY;  Surgeon: Shela Derby, MD;  Location: [facility] OR;  Service: General;  Laterality: N/A;   GASTRIC BYPASS  07/2017   WISDOM TOOTH EXTRACTION     Social History   Social History Narrative   Not on file   Immunization History  Administered Date(s) Administered   Influenza Inj Mdck Quad Pf 02/11/2018   PFIZER(Purple Top)SARS-COV-2 Vaccination 05/28/2019, 06/18/2019, 03/06/2020, 09/13/2020   Pneumococcal Polysaccharide-23 10/07/2013     Objective: Vital Signs: There were no vitals taken for this visit.   Physical Exam   Musculoskeletal Exam: ***  CDAI Exam: CDAI Score: -- Patient Global: --; Provider Global: -- Swollen: --; Tender: -- Joint Exam 09/20/2023   No joint exam has been documented for this visit   There is currently no information documented on the homunculus. Go to the Rheumatology activity and complete the homunculus joint exam.  Investigation: No additional  findings.  Imaging: No results found.  Recent Labs: Lab Results  Component Value Date   WBC 10.2 07/15/2023   HGB 11.8 (L) 07/15/2023   PLT 259 07/15/2023   NA 139 07/15/2023   K 3.3 (L) 07/15/2023   CL 107 07/15/2023   CO2 24 07/15/2023   GLUCOSE 46 (L) 07/15/2023   BUN 13 07/15/2023   CREATININE 0.80 07/15/2023   BILITOT 0.7 03/21/2023   ALKPHOS 63  08/20/2021   AST 10 03/21/2023   ALT 9 03/21/2023   PROT 7.8 03/21/2023   ALBUMIN 3.3 (L) 08/20/2021   CALCIUM 8.7 (L) 07/15/2023   GFRAA 139 04/17/2020    Speciality Comments: PLQ eye exam: 08/31/2021 WNL @ [facility]. Follow up in 1 year.  Procedures:  No procedures performed Allergies: Fruit extracts, Latex, and Zinc   Assessment / Plan:     Visit Diagnoses: Seronegative rheumatoid arthritis (HCC)  High risk medication use  Carpal tunnel syndrome, right upper limb  Chronic pain of right knee  Patellofemoral disorder of left knee  Spondylolysis, lumbar region  Chronic SI joint pain  Positive ANA (antinuclear antibody)  Family history of systemic lupus erythematosus  Myofascial pain  Trapezius muscle spasm  Other fatigue  Chronic pain syndrome  History of iron  deficiency anemia  Vitamin D deficiency  Vitamin B12 deficiency  History of depression  Type 1 diabetes mellitus with diabetic polyneuropathy (HCC)  History of gastroesophageal reflux (GERD)  Diabetic gastroparesis (HCC)  History of asthma  Orders: No orders of the defined types were placed in this encounter.  No orders of the defined types were placed in this encounter.   Face-to-face time spent with patient was *** minutes. Greater than 50% of time was spent in counseling and coordination of care.  Follow-Up Instructions: No follow-ups on file.   Romayne Clubs, PA-C  Note - This record has been created using Dragon software.  Chart creation errors have been sought, but may not always  have been located. Such creation errors do not reflect on  the standard of medical care.

## 2023-09-07 ENCOUNTER — Ambulatory Visit: Payer: BC Managed Care – PPO | Admitting: Rheumatology

## 2023-09-07 DIAGNOSIS — G8929 Other chronic pain: Secondary | ICD-10-CM

## 2023-09-07 DIAGNOSIS — Z8269 Family history of other diseases of the musculoskeletal system and connective tissue: Secondary | ICD-10-CM

## 2023-09-07 DIAGNOSIS — R768 Other specified abnormal immunological findings in serum: Secondary | ICD-10-CM

## 2023-09-07 DIAGNOSIS — Z79899 Other long term (current) drug therapy: Secondary | ICD-10-CM

## 2023-09-07 DIAGNOSIS — G894 Chronic pain syndrome: Secondary | ICD-10-CM

## 2023-09-07 DIAGNOSIS — M62838 Other muscle spasm: Secondary | ICD-10-CM

## 2023-09-07 DIAGNOSIS — M722 Plantar fascial fibromatosis: Secondary | ICD-10-CM

## 2023-09-07 DIAGNOSIS — Z8659 Personal history of other mental and behavioral disorders: Secondary | ICD-10-CM

## 2023-09-07 DIAGNOSIS — M4306 Spondylolysis, lumbar region: Secondary | ICD-10-CM

## 2023-09-07 DIAGNOSIS — M79641 Pain in right hand: Secondary | ICD-10-CM

## 2023-09-07 DIAGNOSIS — E1042 Type 1 diabetes mellitus with diabetic polyneuropathy: Secondary | ICD-10-CM

## 2023-09-07 DIAGNOSIS — M0600 Rheumatoid arthritis without rheumatoid factor, unspecified site: Secondary | ICD-10-CM

## 2023-09-07 DIAGNOSIS — Z8709 Personal history of other diseases of the respiratory system: Secondary | ICD-10-CM

## 2023-09-07 DIAGNOSIS — R809 Proteinuria, unspecified: Secondary | ICD-10-CM

## 2023-09-07 DIAGNOSIS — M222X2 Patellofemoral disorders, left knee: Secondary | ICD-10-CM

## 2023-09-07 DIAGNOSIS — L2089 Other atopic dermatitis: Secondary | ICD-10-CM

## 2023-09-07 DIAGNOSIS — M7918 Myalgia, other site: Secondary | ICD-10-CM

## 2023-09-07 DIAGNOSIS — Z862 Personal history of diseases of the blood and blood-forming organs and certain disorders involving the immune mechanism: Secondary | ICD-10-CM

## 2023-09-07 DIAGNOSIS — E538 Deficiency of other specified B group vitamins: Secondary | ICD-10-CM

## 2023-09-07 DIAGNOSIS — E559 Vitamin D deficiency, unspecified: Secondary | ICD-10-CM

## 2023-09-07 DIAGNOSIS — E1143 Type 2 diabetes mellitus with diabetic autonomic (poly)neuropathy: Secondary | ICD-10-CM

## 2023-09-07 DIAGNOSIS — G5601 Carpal tunnel syndrome, right upper limb: Secondary | ICD-10-CM

## 2023-09-07 DIAGNOSIS — Z8719 Personal history of other diseases of the digestive system: Secondary | ICD-10-CM

## 2023-09-20 ENCOUNTER — Ambulatory Visit: Admitting: Physician Assistant

## 2023-09-20 DIAGNOSIS — G5601 Carpal tunnel syndrome, right upper limb: Secondary | ICD-10-CM

## 2023-09-20 DIAGNOSIS — G894 Chronic pain syndrome: Secondary | ICD-10-CM

## 2023-09-20 DIAGNOSIS — M7918 Myalgia, other site: Secondary | ICD-10-CM

## 2023-09-20 DIAGNOSIS — Z8709 Personal history of other diseases of the respiratory system: Secondary | ICD-10-CM

## 2023-09-20 DIAGNOSIS — G8929 Other chronic pain: Secondary | ICD-10-CM

## 2023-09-20 DIAGNOSIS — M79642 Pain in left hand: Secondary | ICD-10-CM

## 2023-09-20 DIAGNOSIS — Z8719 Personal history of other diseases of the digestive system: Secondary | ICD-10-CM

## 2023-09-20 DIAGNOSIS — R5383 Other fatigue: Secondary | ICD-10-CM

## 2023-09-20 DIAGNOSIS — Z862 Personal history of diseases of the blood and blood-forming organs and certain disorders involving the immune mechanism: Secondary | ICD-10-CM

## 2023-09-20 DIAGNOSIS — E1143 Type 2 diabetes mellitus with diabetic autonomic (poly)neuropathy: Secondary | ICD-10-CM

## 2023-09-20 DIAGNOSIS — Z79899 Other long term (current) drug therapy: Secondary | ICD-10-CM

## 2023-09-20 DIAGNOSIS — E1042 Type 1 diabetes mellitus with diabetic polyneuropathy: Secondary | ICD-10-CM

## 2023-09-20 DIAGNOSIS — E538 Deficiency of other specified B group vitamins: Secondary | ICD-10-CM

## 2023-09-20 DIAGNOSIS — E559 Vitamin D deficiency, unspecified: Secondary | ICD-10-CM

## 2023-09-20 DIAGNOSIS — R768 Other specified abnormal immunological findings in serum: Secondary | ICD-10-CM

## 2023-09-20 DIAGNOSIS — Z8659 Personal history of other mental and behavioral disorders: Secondary | ICD-10-CM

## 2023-09-20 DIAGNOSIS — M62838 Other muscle spasm: Secondary | ICD-10-CM

## 2023-09-20 DIAGNOSIS — M222X2 Patellofemoral disorders, left knee: Secondary | ICD-10-CM

## 2023-09-20 DIAGNOSIS — Z8269 Family history of other diseases of the musculoskeletal system and connective tissue: Secondary | ICD-10-CM

## 2023-09-20 DIAGNOSIS — M0600 Rheumatoid arthritis without rheumatoid factor, unspecified site: Secondary | ICD-10-CM

## 2023-09-20 DIAGNOSIS — M4306 Spondylolysis, lumbar region: Secondary | ICD-10-CM

## 2023-09-20 NOTE — Progress Notes (Unsigned)
 Office Visit Note  Patient: [name]             Date of Birth: 1990-09-23           MRN: [id number]             PCP: [name], MD Referring: Verena Mems, MD Visit Date: 10/04/2023 Occupation: @GUAROCC @  Subjective:  Increased arthralgias  History of Present Illness: [name] is a 33 y.o. female with history of seronegative rheumatoid arthritis.  Patient remains on  Plaquenil  200 mg 1 tablet by mouth twice daily.  She is tolerating Plaquenil  without any side effects and has not had any recent gaps in therapy.  Patient continues to experience intermittent bouts of total body pain and joint stiffness.  She does not feel that her symptoms have been adequately controlled taking Plaquenil .  She has no longer been following up with pain management.  Patient states that she had to discontinue gabapentin  due to GI side effects.  She has found gabapentin  to be helpful in managing her symptoms but does not plan to restart gabapentin  at this time. Patient is aware that she needs to schedule an updated Plaquenil  examination.   Activities of Daily Living:  Patient reports morning stiffness for 30-60 minutes.   Patient Reports nocturnal pain.  Difficulty dressing/grooming: Denies Difficulty climbing stairs: Denies Difficulty getting out of chair: Denies Difficulty using hands for taps, buttons, cutlery, and/or writing: Reports  Review of Systems  Constitutional:  Positive for fatigue.  HENT:  Positive for mouth dryness. Negative for mouth sores.   Eyes:  Negative for dryness.  Respiratory:  Negative for shortness of breath.   Cardiovascular:  Negative for chest pain and palpitations.  Gastrointestinal:  Positive for constipation. Negative for blood in stool and diarrhea.  Endocrine: Negative for increased urination.  Genitourinary:  Negative for involuntary urination.  Musculoskeletal:  Positive for joint pain, joint pain, joint swelling, myalgias, morning stiffness, muscle  tenderness and myalgias. Negative for gait problem and muscle weakness.  Skin:  Positive for rash and sensitivity to sunlight. Negative for color change and hair loss.  Allergic/Immunologic: Positive for susceptible to infections.  Neurological:  Positive for headaches. Negative for dizziness.  Hematological:  Negative for swollen glands.  Psychiatric/Behavioral:  Positive for depressed mood and sleep disturbance. The patient is nervous/anxious.     PMFS History:  Patient Active Problem List   Diagnosis Date Noted   IDA (iron  deficiency anemia) 11/06/2020   Intractable abdominal pain 07/26/2020   Right upper quadrant abdominal pain 07/26/2020   Acute pyelonephritis 07/26/2020   Diabetes mellitus type 1, controlled, with complications (HCC) 07/26/2020   Obesity, Class III, BMI 40-49.9 (morbid obesity) 07/26/2020   Enterococcus faecalis infection 07/26/2020   Essential hypertension 07/26/2020   RUQ abdominal pain 07/26/2020   Chronic low back pain 11/22/2013   Clinical depression 10/07/2013   Extreme obesity 10/07/2013   Diabetic neuropathy (HCC) 08/06/2013   General patient noncompliance 12/21/2012   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HCC) 12/30/2010   Appetite disorder 12/30/2010   BP (high blood pressure) 12/30/2010   Muscle ache 12/30/2010   Cachectic (HCC) 12/30/2010   Patellofemoral disorder of left knee 09/03/2010   Diabetes mellitus (HCC) 09/03/2010    Past Medical History:  Diagnosis Date   Anemia    Anxiety    Arthritis    Asthma    asthma as a child, no problems as an adult   Diabetes mellitus without complication (HCC)    type1 -  insulin  pump   Dysrhythmia    GERD (gastroesophageal reflux disease)    Headache    HLD (hyperlipidemia)    diet controlled   Hypertension    no meds   Neuromuscular disorder (HCC) 2015   periph. neuropathy- no current problems   Vitamin D deficiency     Family History  Problem Relation Age of Onset   Kidney disease Mother    Sickle cell trait  Father    Asthma Sister    ADD / ADHD Sister    Sickle cell anemia Sister    Lupus Maternal Grandmother    Past Surgical History:  Procedure Laterality Date   CATARACT EXTRACTION, BILATERAL Bilateral 2022   CHOLECYSTECTOMY N/A 11/11/2021   Procedure: LAPAROSCOPIC CHOLECYSTECTOMY;  Surgeon: Rubin Calamity, MD;  Location: [facility] OR;  Service: General;  Laterality: N/A;   GASTRIC BYPASS  07/2017   WISDOM TOOTH EXTRACTION     Social History   Social History Narrative   Not on file   Immunization History  Administered Date(s) Administered   Influenza Inj Mdck Quad Pf 02/11/2018   PFIZER(Purple Top)SARS-COV-2 Vaccination 05/28/2019, 06/18/2019, 03/06/2020, 09/13/2020   Pneumococcal Polysaccharide-23 10/07/2013     Objective: Vital Signs: BP (!) 169/110 (BP Location: Left Arm, Patient Position: Sitting, Cuff Size: Normal)   Pulse 92   Resp 16   Ht 5' 4.75 (1.645 m)   Wt 253 lb (114.8 kg)   LMP 09/25/2023   BMI 42.43 kg/m    Physical Exam Vitals and nursing note reviewed.  Constitutional:      Appearance: She is well-developed.  HENT:     Head: Normocephalic and atraumatic.  Eyes:     Conjunctiva/sclera: Conjunctivae normal.  Cardiovascular:     Rate and Rhythm: Normal rate and regular rhythm.     Heart sounds: Normal heart sounds.  Pulmonary:     Effort: Pulmonary effort is normal.     Breath sounds: Normal breath sounds.  Abdominal:     General: Bowel sounds are normal.     Palpations: Abdomen is soft.  Musculoskeletal:     Cervical back: Normal range of motion.  Lymphadenopathy:     Cervical: No cervical adenopathy.  Skin:    General: Skin is warm and dry.     Capillary Refill: Capillary refill takes less than 2 seconds.  Neurological:     Mental Status: She is alert and oriented to person, place, and time.  Psychiatric:        Behavior: Behavior normal.      Musculoskeletal Exam: C-spine has limited range of motion without rotation.  Trapezius muscle  tension tenderness bilaterally.  Shoulder joints have good range of motion with discomfort bilaterally.  Elbow joints, wrist joints, MCPs, PIPs, DIPs have good range of motion with no synovitis.  Complete fist formation bilaterally.  Hip joints have good range of motion with no groin pain.  Knee joints have good range of motion no warmth or effusion.  Ankle joints have good range of motion with no tenderness or joint swelling.  CDAI Exam: CDAI Score: -- Patient Global: --; Provider Global: -- Swollen: --; Tender: -- Joint Exam 10/04/2023   No joint exam has been documented for this visit   There is currently no information documented on the homunculus. Go to the Rheumatology activity and complete the homunculus joint exam.  Investigation: No additional findings.  Imaging: No results found.  Recent Labs: Lab Results  Component Value Date   WBC 10.2 07/15/2023  HGB 11.8 (L) 07/15/2023   PLT 259 07/15/2023   NA 139 07/15/2023   K 3.3 (L) 07/15/2023   CL 107 07/15/2023   CO2 24 07/15/2023   GLUCOSE 46 (L) 07/15/2023   BUN 13 07/15/2023   CREATININE 0.80 07/15/2023   BILITOT 0.7 03/21/2023   ALKPHOS 63 08/20/2021   AST 10 03/21/2023   ALT 9 03/21/2023   PROT 7.8 03/21/2023   ALBUMIN 3.3 (L) 08/20/2021   CALCIUM 8.7 (L) 07/15/2023   GFRAA 139 04/17/2020    Speciality Comments: PLQ eye exam: 08/31/2021 WNL @ [facility]. Follow up in 1 year.  Procedures:  No procedures performed Allergies: Fruit extracts, Latex, and Zinc   Assessment / Plan:     Visit Diagnoses: Seronegative rheumatoid arthritis (HCC) - U/S+ syonvitis Bilateral wrists/left MCP, RF-, CCP-, 14-3-3 eta negative: She presents today with increased pain involving multiple joints.  During flares she experiences increased total body pain but continues to have persistent pain involving both hands.  She has been taking Plaquenil  200 mg 1 tablet by mouth twice daily.  She is tolerating Plaquenil  without any  side effects and has not had any recent gaps in therapy.  Her arthralgias have been inadequately controlled while taking Plaquenil .  Discussed the option of initiating meloxicam  7.5 mg 1 tablet daily as needed for pain relief.  Reviewed indications, contraindications, and potential side effects of meloxicam  today in detail.  Encouraged the patient to take meloxicam  with food and avoid the use of additional NSAIDs or prednisone  while taking meloxicam . She will remain on Plaquenil  as prescribed.  A refill of Plaquenil  sent to the pharmacy today.  Discussed the importance of updating a Plaquenil  eye examination.  She will notify us  if her symptoms persist or worsen.  She will follow-up in the office in 5 months or sooner if needed.  High risk medication use - Plaquenil  200 mg 1 tablet by mouth twice daily. PLQ eye exam: 08/31/2021 WNL @ [facility]. Follow up in 1 year.  Discussed the importance of scheduling an updated Plaquenil  eye examination urgently. CBC and BMP updated on 07/15/23.  Orders for CBC and CMP released today. Plan: CBC with Differential/Platelet, Comprehensive metabolic panel with GFR  Carpal tunnel syndrome, right upper limb: Intermittent paresthesias in the right hand.  Patient had improvement while taking gabapentin  had to discontinue due to side effects.  Pain in both hands - X-rays of both hands were unremarkable on 02/25/2021.  No radiographic progression was noted when compared to x-rays from 2020.  Patient continues to have chronic pain and stiffness involving both hands.  Plan to initiate meloxicam  7.5 mg 1 tablet daily as needed for symptomatic relief.  Chronic pain of right knee: Intermittent discomfort.  No effusion noted.  Plan to initiate meloxicam  as discussed above.  Patellofemoral disorder of left knee: Intermittent discomfort.   Spondylolysis, lumbar region: Chronic pain.  Patient has not been going for injections with pain management any longer.  Plan to  initiate meloxicam  as discussed above.  Chronic SI joint pain: Chronic pain.   Positive ANA (antinuclear antibody) - ANA positive 11/15/19.06/17/2020 ANA neg. 01/31/22: ANA negative.  Lab work from 01/31/22: ANA negative, dsDNA negative, complements WNL. Lab work from 03/21/2023 was reviewed today in the office: ANA remains positive, CRP within normal limits, complements within normal limits, double-stranded dsDNA negative, urine protein creatinine ratio elevated, ESR 36. Plan to obtain the following lab work today for further evaluation.- Plan: hydroxychloroquine  (PLAQUENIL ) 200 MG tablet, Protein /  creatinine ratio, urine, CBC with Differential/Platelet, Anti-DNA antibody, double-stranded, C3 and C4, Sedimentation rate, Comprehensive metabolic panel with GFR, ANA  Family history of systemic lupus erythematosus - Plan: hydroxychloroquine  (PLAQUENIL ) 200 MG tablet  Myofascial pain: She continues to have generalized myalgias and muscle tenderness due to fibromyalgia.  She continues to experience intermittent flares.  Patient had noticed benefit while taking gabapentin  but had to discontinue due to side effects.   Other fatigue: She continues to experience intermittent bouts of fatigue.  Chronic pain syndrome: No longer following up at pain management.   Trapezius muscle spasm: She takes baclofen 10 mg 3 times daily as needed for muscle spasms.  Other medical conditions are listed as follows:  History of iron  deficiency anemia  Vitamin D deficiency  Vitamin B12 deficiency  History of depression  Type 1 diabetes mellitus with diabetic polyneuropathy (HCC): Under care of endocrinology.  History of gastroesophageal reflux (GERD)  Diabetic gastroparesis (HCC)  History of asthma   Orders: Orders Placed This Encounter  Procedures   Protein / creatinine ratio, urine   CBC with Differential/Platelet   Anti-DNA antibody, double-stranded   C3 and C4   Sedimentation rate   Comprehensive  metabolic panel with GFR   ANA   Meds ordered this encounter  Medications   meloxicam  (MOBIC ) 7.5 MG tablet    Sig: Take 1 tablet (7.5 mg total) by mouth daily as needed for pain.    Dispense:  90 tablet    Refill:  0   hydroxychloroquine  (PLAQUENIL ) 200 MG tablet    Sig: TAKE 1 TABLET(200 MG) BY MOUTH TWICE DAILY    Dispense:  180 tablet    Refill:  0    Follow-Up Instructions: Return in about 5 months (around 03/05/2024) for Rheumatoid arthritis.   Waddell CHRISTELLA Craze, PA-C  Note - This record has been created using Dragon software.  Chart creation errors have been sought, but may not always  have been located. Such creation errors do not reflect on  the standard of medical care.

## 2023-10-04 ENCOUNTER — Ambulatory Visit: Attending: Physician Assistant | Admitting: Physician Assistant

## 2023-10-04 ENCOUNTER — Encounter: Payer: Self-pay | Admitting: Physician Assistant

## 2023-10-04 VITALS — BP 169/110 | HR 92 | Resp 16 | Ht 64.75 in | Wt 253.0 lb

## 2023-10-04 DIAGNOSIS — G8929 Other chronic pain: Secondary | ICD-10-CM

## 2023-10-04 DIAGNOSIS — M25561 Pain in right knee: Secondary | ICD-10-CM

## 2023-10-04 DIAGNOSIS — M0600 Rheumatoid arthritis without rheumatoid factor, unspecified site: Secondary | ICD-10-CM

## 2023-10-04 DIAGNOSIS — E1042 Type 1 diabetes mellitus with diabetic polyneuropathy: Secondary | ICD-10-CM

## 2023-10-04 DIAGNOSIS — Z8709 Personal history of other diseases of the respiratory system: Secondary | ICD-10-CM

## 2023-10-04 DIAGNOSIS — M4306 Spondylolysis, lumbar region: Secondary | ICD-10-CM

## 2023-10-04 DIAGNOSIS — E1143 Type 2 diabetes mellitus with diabetic autonomic (poly)neuropathy: Secondary | ICD-10-CM

## 2023-10-04 DIAGNOSIS — E538 Deficiency of other specified B group vitamins: Secondary | ICD-10-CM

## 2023-10-04 DIAGNOSIS — M222X2 Patellofemoral disorders, left knee: Secondary | ICD-10-CM

## 2023-10-04 DIAGNOSIS — Z8719 Personal history of other diseases of the digestive system: Secondary | ICD-10-CM

## 2023-10-04 DIAGNOSIS — M79641 Pain in right hand: Secondary | ICD-10-CM

## 2023-10-04 DIAGNOSIS — Z862 Personal history of diseases of the blood and blood-forming organs and certain disorders involving the immune mechanism: Secondary | ICD-10-CM

## 2023-10-04 DIAGNOSIS — M7918 Myalgia, other site: Secondary | ICD-10-CM

## 2023-10-04 DIAGNOSIS — R768 Other specified abnormal immunological findings in serum: Secondary | ICD-10-CM

## 2023-10-04 DIAGNOSIS — G5601 Carpal tunnel syndrome, right upper limb: Secondary | ICD-10-CM

## 2023-10-04 DIAGNOSIS — Z79899 Other long term (current) drug therapy: Secondary | ICD-10-CM

## 2023-10-04 DIAGNOSIS — R5383 Other fatigue: Secondary | ICD-10-CM

## 2023-10-04 DIAGNOSIS — E559 Vitamin D deficiency, unspecified: Secondary | ICD-10-CM

## 2023-10-04 DIAGNOSIS — Z8269 Family history of other diseases of the musculoskeletal system and connective tissue: Secondary | ICD-10-CM

## 2023-10-04 DIAGNOSIS — Z8659 Personal history of other mental and behavioral disorders: Secondary | ICD-10-CM

## 2023-10-04 DIAGNOSIS — M79642 Pain in left hand: Secondary | ICD-10-CM

## 2023-10-04 DIAGNOSIS — K3184 Gastroparesis: Secondary | ICD-10-CM

## 2023-10-04 DIAGNOSIS — M62838 Other muscle spasm: Secondary | ICD-10-CM

## 2023-10-04 DIAGNOSIS — M533 Sacrococcygeal disorders, not elsewhere classified: Secondary | ICD-10-CM

## 2023-10-04 DIAGNOSIS — G894 Chronic pain syndrome: Secondary | ICD-10-CM

## 2023-10-04 MED ORDER — MELOXICAM 7.5 MG PO TABS: 7.5000 mg | ORAL_TABLET | Freq: Every day | ORAL | 0 refills | Status: AC | PRN

## 2023-10-04 MED ORDER — HYDROXYCHLOROQUINE SULFATE 200 MG PO TABS
ORAL_TABLET | ORAL | 0 refills | Status: AC
Start: 2023-10-04 — End: ?

## 2023-10-04 NOTE — Patient Instructions (Signed)
 Standing Labs We placed an order today for your standing lab work.   Please have your standing labs drawn in 1 month   Please have your labs drawn 2 weeks prior to your appointment so that the provider can discuss your lab results at your appointment, if possible.  Please note that you may see your imaging and lab results in MyChart before we have reviewed them. We will contact you once all results are reviewed. Please allow our office up to 72 hours to thoroughly review all of the results before contacting the office for clarification of your results.  WALK-IN LAB HOURS  Monday through Thursday from 8:00 am -12:30 pm and 1:00 pm-4:30 pm and Friday from 8:00 am-12:00 pm.  Patients with office visits requiring labs will be seen before walk-in labs.  You may encounter longer than normal wait times. Please allow additional time. Wait times may be shorter on  Monday and Thursday afternoons.  We do not book appointments for walk-in labs. We appreciate your patience and understanding with our staff.   Labs are drawn by [facility]. Please bring your co-pay at the time of your lab draw.  You may receive a bill from [facility] for your lab work.  Please note if you are on Hydroxychloroquine  and and an order has been placed for a Hydroxychloroquine  level,  you will need to have it drawn 4 hours or more after your last dose.  If you wish to have your labs drawn at another location, please call the office 24 hours in advance so we can fax the orders.  The office is located at [address], [place], [place] [address]   If you have any questions regarding directions or hours of operation,  please call [phone].   As a reminder, please drink plenty of water prior to coming for your lab work. Thanks!

## 2023-10-05 ENCOUNTER — Ambulatory Visit: Payer: Self-pay | Admitting: Physician Assistant

## 2023-10-05 NOTE — Progress Notes (Signed)
 Hemoglobin remains low- She is scheduled for an upcoming visit with hematology.   ESR remains elevated but has improved.   Glucose is 44. Rest of CMP WNL.

## 2023-10-06 NOTE — Progress Notes (Signed)
 dsDNA is negative

## 2023-10-09 NOTE — Progress Notes (Signed)
Complements WNL. Labs are not consistent with a flare.

## 2023-10-10 LAB — COMPREHENSIVE METABOLIC PANEL WITH GFR
AG Ratio: 1.2 (calc) (ref 1.0–2.5)
ALT: 7 U/L (ref 6–29)
AST: 12 U/L (ref 10–30)
Albumin: 3.9 g/dL (ref 3.6–5.1)
Alkaline phosphatase (APISO): 67 U/L (ref 31–125)
BUN: 10 mg/dL (ref 7–25)
CO2: 28 mmol/L (ref 20–32)
Calcium: 9.1 mg/dL (ref 8.6–10.2)
Chloride: 103 mmol/L (ref 98–110)
Creat: 0.72 mg/dL (ref 0.50–0.97)
Globulin: 3.3 g/dL (ref 1.9–3.7)
Glucose, Bld: 44 mg/dL — ABNORMAL LOW (ref 65–99)
Potassium: 4 mmol/L (ref 3.5–5.3)
Sodium: 139 mmol/L (ref 135–146)
Total Bilirubin: 0.6 mg/dL (ref 0.2–1.2)
Total Protein: 7.2 g/dL (ref 6.1–8.1)
eGFR: 115 mL/min/1.73m2 (ref >=60)

## 2023-10-10 LAB — CBC WITH DIFFERENTIAL/PLATELET
Absolute Lymphocytes: 4704 {cells}/uL — ABNORMAL HIGH (ref 850–3900)
Absolute Monocytes: 764 {cells}/uL (ref 200–950)
Basophils Absolute: 67 {cells}/uL (ref 0–200)
Basophils Relative: 0.8 %
Eosinophils Absolute: 109 {cells}/uL (ref 15–500)
Eosinophils Relative: 1.3 %
HCT: 35.2 % (ref 35.0–45.0)
Hemoglobin: 10.8 g/dL — ABNORMAL LOW (ref 11.7–15.5)
MCH: 26.1 pg — ABNORMAL LOW (ref 27.0–33.0)
MCHC: 30.7 g/dL — ABNORMAL LOW (ref 32.0–36.0)
MCV: 85 fL (ref 80.0–100.0)
MPV: 12.8 fL — ABNORMAL HIGH (ref 7.5–12.5)
Monocytes Relative: 9.1 %
Neutro Abs: 2755 {cells}/uL (ref 1500–7800)
Neutrophils Relative %: 32.8 %
Platelets: 257 Thousand/uL (ref 140–400)
RBC: 4.14 Million/uL (ref 3.80–5.10)
RDW: 13 % (ref 11.0–15.0)
Total Lymphocyte: 56 %
WBC: 8.4 Thousand/uL (ref 3.8–10.8)

## 2023-10-10 LAB — ANTI-NUCLEAR AB-TITER (ANA TITER): ANA Titer 1: 1:40 {titer} — ABNORMAL HIGH

## 2023-10-10 LAB — C3 AND C4
C3 Complement: 147 mg/dL (ref 83–193)
C4 Complement: 30 mg/dL (ref 15–57)

## 2023-10-10 LAB — ANTI-DNA ANTIBODY, DOUBLE-STRANDED: ds DNA Ab: <1 [IU]/mL

## 2023-10-10 LAB — ANA: Anti Nuclear Antibody (ANA): POSITIVE — AB

## 2023-10-10 LAB — SEDIMENTATION RATE: Sed Rate: 28 mm/h — ABNORMAL HIGH (ref 0–20)

## 2023-10-10 NOTE — Progress Notes (Signed)
 ANA remains positive but is a very low titer.  No change in therapy recommended at this time.

## 2023-10-19 ENCOUNTER — Telehealth: Payer: Self-pay

## 2023-10-19 NOTE — Telephone Encounter (Signed)
 Left message to confirm appt for 8/1

## 2023-10-20 ENCOUNTER — Encounter: Payer: Self-pay | Admitting: Hematology and Oncology

## 2023-10-20 ENCOUNTER — Inpatient Hospital Stay: Attending: Hematology and Oncology | Admitting: Hematology and Oncology

## 2023-10-20 ENCOUNTER — Ambulatory Visit: Payer: Self-pay | Admitting: Hematology and Oncology

## 2023-10-20 ENCOUNTER — Inpatient Hospital Stay

## 2023-10-20 VITALS — BP 141/78 | HR 86 | Temp 99.7°F | Resp 17 | Wt 262.2 lb

## 2023-10-20 DIAGNOSIS — D649 Anemia, unspecified: Secondary | ICD-10-CM | POA: Diagnosis present

## 2023-10-20 DIAGNOSIS — M329 Systemic lupus erythematosus, unspecified: Secondary | ICD-10-CM | POA: Insufficient documentation

## 2023-10-20 DIAGNOSIS — Z9884 Bariatric surgery status: Secondary | ICD-10-CM | POA: Insufficient documentation

## 2023-10-20 DIAGNOSIS — D500 Iron deficiency anemia secondary to blood loss (chronic): Secondary | ICD-10-CM

## 2023-10-20 DIAGNOSIS — Z8481 Family history of carrier of genetic disease: Secondary | ICD-10-CM | POA: Insufficient documentation

## 2023-10-20 DIAGNOSIS — Z87891 Personal history of nicotine dependence: Secondary | ICD-10-CM | POA: Diagnosis not present

## 2023-10-20 DIAGNOSIS — E538 Deficiency of other specified B group vitamins: Secondary | ICD-10-CM | POA: Diagnosis not present

## 2023-10-20 DIAGNOSIS — Z130 Encounter for screening for diseases of the blood and blood-forming organs and certain disorders involving the immune mechanism: Secondary | ICD-10-CM | POA: Diagnosis not present

## 2023-10-20 LAB — CBC WITH DIFFERENTIAL/PLATELET
Abs Immature Granulocytes: 0.02 K/uL (ref 0.00–0.07)
Basophils Absolute: 0 K/uL (ref 0.0–0.1)
Basophils Relative: 1 %
Eosinophils Absolute: 0.1 K/uL (ref 0.0–0.5)
Eosinophils Relative: 1 %
HCT: 29.3 % — ABNORMAL LOW (ref 36.0–46.0)
Hemoglobin: 9.5 g/dL — ABNORMAL LOW (ref 12.0–15.0)
Immature Granulocytes: 0 %
Lymphocytes Relative: 41 %
Lymphs Abs: 2.3 K/uL (ref 0.7–4.0)
MCH: 26.7 pg (ref 26.0–34.0)
MCHC: 32.4 g/dL (ref 30.0–36.0)
MCV: 82.3 fL (ref 80.0–100.0)
Monocytes Absolute: 0.3 K/uL (ref 0.1–1.0)
Monocytes Relative: 6 %
Neutro Abs: 2.9 K/uL (ref 1.7–7.7)
Neutrophils Relative %: 51 %
Platelets: 194 K/uL (ref 150–400)
RBC: 3.56 MIL/uL — ABNORMAL LOW (ref 3.87–5.11)
RDW: 13.9 % (ref 11.5–15.5)
WBC: 5.6 K/uL (ref 4.0–10.5)
nRBC: 0 % (ref 0.0–0.2)

## 2023-10-20 LAB — IRON AND IRON BINDING CAPACITY (CC-WL,HP ONLY)
Iron: 38 ug/dL (ref 28–170)
Saturation Ratios: 11 % (ref 10.4–31.8)
TIBC: 354 ug/dL (ref 250–450)
UIBC: 316 ug/dL (ref 148–442)

## 2023-10-20 LAB — VITAMIN B12: Vitamin B-12: 124 pg/mL — ABNORMAL LOW (ref 180–914)

## 2023-10-20 LAB — FERRITIN: Ferritin: 22 ng/mL (ref 11–307)

## 2023-10-20 NOTE — Progress Notes (Signed)
 [facility] CONSULT NOTE  Patient Care Team: [name], MD as PCP - General (Endocrinology)  CHIEF COMPLAINTS/PURPOSE OF CONSULTATION:  Anemia, unspecified, follow-up  ASSESSMENT & PLAN:   This is a very pleasant 33 year old female patient with juvenile diabetes, rheumatoid arthritis on Plaquenil , hypertension, obesity status post gastric sleeve surgery status post IV Venofer  last dose in September 2022 here for follow-up.    Assessment and Plan Assessment & Plan Systemic lupus erythematosus with secondary chronic anemia and fatigue Worsening fatigue and chronic weakness likely due to SLE. Mild anemia possibly secondary to lupus inflammation or iron  deficiency, considering gastric sleeve surgery and menstruation history. - Order hemoglobin electrophoresis. - Check iron  and B12 levels. - Continue Plaquenil . - Continue ferrous sulfate  every other day.  Suspected iron  deficiency anemia Iron  deficiency anemia likely due to gastric sleeve surgery and menstruation. Ice craving suggests pica associated with iron  deficiency. - Check iron  levels. - Continue ferrous sulfate  every other day.  Screening for sickle cell trait Screening for sickle cell trait due to father's carrier status. Asymptomatic, current blood work does not suggest trait. Advised on potential risks including high altitudes and rare kidney cancer risk. - Order hemoglobin electrophoresis. - Advise to avoid living at high altitudes if sickle cell trait is confirmed.     No problem-specific Assessment & Plan notes found for this encounter.  Thank you for consulting us  the care of this patient.  Please not hesitate to contact us  with any additional questions or concerns  HISTORY OF PRESENTING ILLNESS:   [name] 33 y.o. female is here because of anemia.  This is a very pleasant 33 year old female patient with past medical history significant for juvenile diabetes, rheumatoid arthritis on  Plaquenil , gastric sleeve surgery referred to hematology for evaluation of anemia.  During her last visit, we have agreed to proceeding with intravenous iron  infusion.  She completed her Venofer  on 12/02/2020 and is here for follow-up  Discussed the use of AI scribe software for clinical note transcription with the patient, who gave verbal consent to proceed.  History of Present Illness [name] is a 33 year old female with lupus and anemia who presents for evaluation of potential sickle cell trait and persistent anemia. She was referred by her rheumatologist for evaluation of consistent low blood results.  She has a history of lupus, which has been associated with mild anemia. Her hemoglobin levels have been around eleven grams for some time, which may be her new baseline. She became iron  deficient after a gastric sleeve surgery in the past. Despite taking ferrous sulfate  daily, she experiences daily ice cravings. No swelling, blood in stool, or black stool. Normal menstruation.  She recently discovered that her father has sickle cell trait, prompting her to seek testing to determine if she also carries the trait. She is concerned due to her recent reconnection with her father and potential implications for her health and future family planning.  She experiences chronic fatigue, which she describes as worsening, stating 'no matter how much sleep I get, I'm just tired.' She reports getting six to eight hours of sleep per night. 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  Rest of the pertinent 10 point ROS reviewed and neg.  MEDICAL HISTORY:  Past Medical History:  Diagnosis Date   Anemia    Anxiety    Arthritis    Asthma  asthma as a child, no problems as an adult   Diabetes mellitus without complication (HCC)    type1 - insulin  pump   Dysrhythmia    GERD  (gastroesophageal reflux disease)    Headache    HLD (hyperlipidemia)    diet controlled   Hypertension    no meds   Neuromuscular disorder (HCC) 2015   periph. neuropathy- no current problems   Vitamin D deficiency     SURGICAL HISTORY: Past Surgical History:  Procedure Laterality Date   CATARACT EXTRACTION, BILATERAL Bilateral 2022   CHOLECYSTECTOMY N/A 11/11/2021   Procedure: LAPAROSCOPIC CHOLECYSTECTOMY;  Surgeon: Rubin Calamity, MD;  Location: [facility] OR;  Service: General;  Laterality: N/A;   GASTRIC BYPASS  07/2017   WISDOM TOOTH EXTRACTION      SOCIAL HISTORY: Social History   Socioeconomic History   Marital status: Single    Spouse name: Not on file   Number of children: Not on file   Years of education: Not on file   Highest education level: Not on file  Occupational History   Not on file  Tobacco Use   Smoking status: Never    Passive exposure: Never   Smokeless tobacco: Never  Vaping Use   Vaping status: Former  Substance and Sexual Activity   Alcohol use: Yes    Comment: occasional wine/liquor   Drug use: Never   Sexual activity: Yes    Birth control/protection: None  Other Topics Concern   Not on file  Social History Narrative   Not on file   Social Drivers of Health   Financial Resource Strain: Not on file  Food Insecurity: Not on file  Transportation Needs: Not on file  Physical Activity: Not on file  Stress: Not on file  Social Connections: Unknown (08/27/2021)   [facility]   Social Network    Social Network: Not on file  Intimate Partner Violence: Unknown (08/27/2021)   Received from [facility]   HITS    Physically Hurt: Not on file    Insult or Talk Down To: Not on file    Threaten Physical Harm: Not on file    Scream or Curse: Not on file    FAMILY HISTORY: Family History  Problem Relation Age of Onset   Kidney disease Mother    Sickle cell trait Father    Asthma Sister    ADD / ADHD Sister    Sickle cell  anemia Sister    Lupus Maternal Grandmother     ALLERGIES:  is allergic to fruit extracts, latex, and zinc.  MEDICATIONS:  Current Outpatient Medications  Medication Sig Dispense Refill   albuterol  (PROVENTIL  HFA;VENTOLIN  HFA) 108 (90 Base) MCG/ACT inhaler Inhale 2 puffs into the lungs every 4 (four) hours as needed for wheezing.     ALPRAZolam (XANAX) 0.5 MG tablet Take by mouth.     baclofen (LIORESAL) 10 MG tablet Take 10 mg by mouth 3 (three) times daily as needed for muscle spasms.     diclofenac  Sodium (VOLTAREN ) 1 % GEL Apply 2-4 grams to affected joint 4 times daily as needed. 400 g 2   ergocalciferol (VITAMIN D2) 1.25 MG (50000 UT) capsule Take 50,000 Units by mouth every Thursday.     ferrous sulfate  325 (65 FE) MG EC tablet Take 1 tablet (325 mg total) by mouth 2 (two) times daily with a meal. 60 tablet 3   FLUoxetine  (PROZAC ) 20 MG capsule Take 20 mg by mouth daily.  FLUoxetine  (PROZAC ) 40 MG capsule Take 40 mg by mouth daily.     glucose blood test strip for use when checking blood sugars InVitro three times a day for 90 days     HUMALOG 100 UNIT/ML injection Inject 100 Units as directed See admin instructions. Inject up to 100 units per day via insulin  pump as directed  9   HYDROcodone -acetaminophen  (NORCO/VICODIN) 5-325 MG tablet Take 1 tablet by mouth every 6 (six) hours as needed for moderate pain or severe pain.     hydroxychloroquine  (PLAQUENIL ) 200 MG tablet TAKE 1 TABLET(200 MG) BY MOUTH TWICE DAILY 180 tablet 0   Insulin  Pen Needle 32G X 4 MM MISC . subcutaneous 4 times a day for 30 days     meloxicam  (MOBIC ) 7.5 MG tablet Take 1 tablet (7.5 mg total) by mouth daily as needed for pain. 90 tablet 0   ondansetron  (ZOFRAN  ODT) 4 MG disintegrating tablet Take 1 tablet (4 mg total) by mouth every 8 (eight) hours as needed for up to 15 doses for nausea or vomiting. 15 tablet 0   prazosin  (MINIPRESS ) 2 MG capsule Take 2 mg by mouth at bedtime as needed (sleep).      traZODone  (DESYREL ) 50 MG tablet Take 100 mg by mouth at bedtime as needed for sleep.     triamcinolone  cream (KENALOG ) 0.1 % Apply 1 Application topically 2 (two) times daily as needed. 45 g 0   Lactobacillus (PROBIOTIC ACIDOPHILUS) TABS Take 1 tablet by mouth daily. (Patient not taking: Reported on 10/04/2023)     losartan (COZAAR) 50 MG tablet Take 100 mg by mouth daily. (Patient not taking: Reported on 10/20/2023)     magic mouthwash (nystatin , hydrocortisone, diphenhydrAMINE ) suspension Swish and spit 5 mLs 3 (three) times daily. (Patient taking differently: Swish and spit 5 mLs as needed.) 240 mL 0   methocarbamol  (ROBAXIN ) 500 MG tablet Take 1 tablet (500 mg total) by mouth 2 (two) times daily. (Patient not taking: Reported on 10/20/2023) 20 tablet 0   pantoprazole (PROTONIX) 40 MG tablet Take 40 mg by mouth daily. (Patient not taking: Reported on 10/20/2023)     propranolol (INDERAL) 20 MG tablet Take 20 mg by mouth daily. (Patient not taking: Reported on 10/04/2023)     No current facility-administered medications for this visit.     PHYSICAL EXAMINATION:  ECOG PERFORMANCE STATUS: 0 - Asymptomatic  Vitals:   10/20/23 0848 10/20/23 0849  BP: (!) 159/85 (!) 141/78  Pulse: 86   Resp: 17   Temp: 99.7 F (37.6 C)   SpO2: 100%       Filed Weights   10/20/23 0848  Weight: 262 lb 3.2 oz (118.9 kg)    Physical Exam Constitutional:      Appearance: Normal appearance.  HENT:     Head: Normocephalic and atraumatic.  Cardiovascular:     Rate and Rhythm: Normal rate and regular rhythm.     Pulses: Normal pulses.     Heart sounds: Normal heart sounds.  Pulmonary:     Effort: Pulmonary effort is normal.     Breath sounds: Normal breath sounds.  Musculoskeletal:        General: No swelling or tenderness.     Cervical back: Normal range of motion and neck supple. No rigidity.  Lymphadenopathy:     Cervical: No cervical adenopathy.  Skin:    General: Skin is warm.  Neurological:      General: No focal deficit present.     Mental  Status: She is alert.  Psychiatric:        Mood and Affect: Mood normal.      LABORATORY DATA:  I have reviewed the data as listed Lab Results  Component Value Date   WBC 8.4 10/04/2023   HGB 10.8 (L) 10/04/2023   HCT 35.2 10/04/2023   MCV 85.0 10/04/2023   PLT 257 10/04/2023     Chemistry      Component Value Date/Time   NA 139 10/04/2023 1329   K 4.0 10/04/2023 1329   CL 103 10/04/2023 1329   CO2 28 10/04/2023 1329   BUN 10 10/04/2023 1329   CREATININE 0.72 10/04/2023 1329      Component Value Date/Time   CALCIUM 9.1 10/04/2023 1329   ALKPHOS 63 08/20/2021 1315   AST 12 10/04/2023 1329   AST 14 (L) 08/20/2021 1315   ALT 7 10/04/2023 1329   ALT 11 08/20/2021 1315   BILITOT 0.6 10/04/2023 1329   BILITOT 0.6 08/20/2021 1315       RADIOGRAPHIC STUDIES: I have personally reviewed the radiological images as listed and agreed with the findings in the report. No results found.  All questions were answered. The patient knows to call the clinic with any problems, questions or concerns.  Total time spent: 30 min including history and physical, review of records, counseling and coordination of care.    Amber Stalls, MD 10/20/2023 9:11 AM

## 2023-10-22 LAB — HGB FRACTIONATION CASCADE
Hgb A2: 2.6 % (ref 1.8–3.2)
Hgb A: 97.4 % (ref 96.4–98.8)
Hgb F: 0 % (ref 0.0–2.0)
Hgb S: 0 %

## 2023-10-23 ENCOUNTER — Other Ambulatory Visit: Payer: Self-pay | Admitting: *Deleted

## 2023-10-23 ENCOUNTER — Other Ambulatory Visit: Payer: Self-pay | Admitting: Hematology and Oncology

## 2023-10-23 LAB — FOLATE RBC
Folate, Hemolysate: 250 ng/mL
Folate, RBC: 799 ng/mL (ref >498)
Hematocrit: 31.3 % — ABNORMAL LOW (ref 34.0–46.6)

## 2023-10-23 MED ORDER — CYANOCOBALAMIN 1000 MCG/ML IJ SOLN: 1000.0000 ug | INTRAMUSCULAR | 1 refills | Status: AC

## 2023-10-23 MED ORDER — SYRINGE 25G X 1" 3 ML MISC: 1 refills | Status: AC

## 2023-10-23 NOTE — Progress Notes (Signed)
Discussed the results with patient.

## 2023-12-06 ENCOUNTER — Telehealth: Payer: Self-pay | Admitting: Physician Assistant

## 2023-12-06 ENCOUNTER — Encounter: Payer: Self-pay | Admitting: *Deleted

## 2023-12-06 NOTE — Telephone Encounter (Signed)
 Patient would like to speak with taylor. Patient is stating she feels like she is having a flare. Patient stated she would like the call to be today.

## 2023-12-06 NOTE — Telephone Encounter (Signed)
 I returned the patient's call to discuss the symptoms she is experiencing.  Patient states that she has been having a flare involving multiple joints.  She has been performing more overuse or repetitive activities which she feels has contributed to the flare.  She is also experiencing increased fatigue.  Patient is having difficulty performing her responsibilities at work due to severity of symptoms.  To alleviate her current symptoms we discussed increasing the dose of meloxicam  to 7.5 mg twice daily as needed during the flare.  She voiced understanding.  Discussed the importance of taking meloxicam  with food.  Okay to provide a work note to have the patient return Monday.

## 2023-12-06 NOTE — Telephone Encounter (Signed)
 Work note provided for patient. Left it at front desk for pick up.

## 2023-12-06 NOTE — Telephone Encounter (Signed)
 Returned call to patient. She states she started flaring Monday evening. Patient states her hands are swollen and she is unable to take her ring off. Patient states her body is aching and she is having extreme fatigue. Patient states she is barely making it though the day at work. Patient states she she is unable to sleep at night due to the pain. Patient states she is on PLQ and Mobic  as prescribed. Patient denies any missed doses. Please advise.

## 2024-02-01 ENCOUNTER — Other Ambulatory Visit: Payer: Self-pay | Admitting: Medical Genetics

## 2024-02-20 NOTE — Progress Notes (Deleted)
 "  Office Visit Note  Patient: [name]             Date of Birth: [birth year]           MRN: [id number]             PCP: [name], MD Referring: [name], MD Visit Date: 03/05/2024 Occupation: Data Unavailable  Subjective:    History of Present Illness: [name] is a 33 y.o. female with history of seronegative rheumatoid arthritis.  Patient remains on Plaquenil  200 mg 1 tablet by mouth twice daily.   PLQ eye exam: 08/31/2021 WNL @ [facility]. Follow up in 1 year.  CBC updated on 10/20/2023.   ANA positive 11/15/19.06/17/2020 ANA neg. 01/31/22: ANA negative.  Lab work from 01/31/22: ANA negative, dsDNA negative, complements WNL. Lab work from 03/21/2023 was reviewed today in the office: ANA remains positive, CRP within normal limits, complements within normal limits, double-stranded dsDNA negative, urine protein creatinine ratio elevated, ESR 36.  Activities of Daily Living:  Patient reports morning stiffness for *** {minute/hour:19697}.   Patient {ACTIONS;DENIES/REPORTS:21021675::Denies} nocturnal pain.  Difficulty dressing/grooming: {ACTIONS;DENIES/REPORTS:21021675::Denies} Difficulty climbing stairs: {ACTIONS;DENIES/REPORTS:21021675::Denies} Difficulty getting out of chair: {ACTIONS;DENIES/REPORTS:21021675::Denies} Difficulty using hands for taps, buttons, cutlery, and/or writing: {ACTIONS;DENIES/REPORTS:21021675::Denies}  No Rheumatology ROS completed.   PMFS History:  Patient Active Problem List   Diagnosis Date Noted   IDA (iron  deficiency anemia) 11/06/2020   Intractable abdominal pain 07/26/2020   Right upper quadrant abdominal pain 07/26/2020   Acute pyelonephritis 07/26/2020   Diabetes mellitus type 1, controlled, with complications (HCC) 07/26/2020   Obesity, Class III, BMI 40-49.9 (morbid obesity) (HCC) 07/26/2020   Enterococcus faecalis infection 07/26/2020   Essential hypertension 07/26/2020   RUQ abdominal pain 07/26/2020    Chronic low back pain 11/22/2013   Clinical depression 10/07/2013   Extreme obesity 10/07/2013   Diabetic neuropathy (HCC) 08/06/2013   General patient noncompliance 12/21/2012   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HCC) 12/30/2010   Appetite disorder 12/30/2010   BP (high blood pressure) 12/30/2010   Muscle ache 12/30/2010   Cachectic 12/30/2010   Patellofemoral disorder of left knee 09/03/2010   Diabetes mellitus (HCC) 09/03/2010    Past Medical History:  Diagnosis Date   Anemia    Anxiety    Arthritis    Asthma    asthma as a child, no problems as an adult   Diabetes mellitus without complication (HCC)    type1 - insulin  pump   Dysrhythmia    GERD (gastroesophageal reflux disease)    Headache    HLD (hyperlipidemia)    diet controlled   Hypertension    no meds   Neuromuscular disorder (HCC) 2015   periph. neuropathy- no current problems   Vitamin D deficiency     Family History  Problem Relation Age of Onset   Kidney disease Mother    Sickle cell trait Father    Asthma Sister    ADD / ADHD Sister    Sickle cell anemia Sister    Lupus Maternal Grandmother    Past Surgical History:  Procedure Laterality Date   CATARACT EXTRACTION, BILATERAL Bilateral 2022   CHOLECYSTECTOMY N/A 11/11/2021   Procedure: LAPAROSCOPIC CHOLECYSTECTOMY;  Surgeon: Rubin Calamity, MD;  Location: [facility] OR;  Service: General;  Laterality: N/A;   GASTRIC BYPASS  07/2017   WISDOM TOOTH EXTRACTION     Social History   Tobacco Use   Smoking status: Never    Passive exposure: Never   Smokeless tobacco: Never  Vaping  Use   Vaping status: Former  Substance Use Topics   Alcohol use: Yes    Comment: occasional wine/liquor   Drug use: Never   Social History   Social History Narrative   Not on file     Immunization History  Administered Date(s) Administered   Influenza Inj Mdck Quad Pf 02/11/2018   PFIZER(Purple Top)SARS-COV-2 Vaccination 05/28/2019, 06/18/2019, 03/06/2020, 09/13/2020   Pneumococcal  Polysaccharide-23 10/07/2013     Objective: Vital Signs: There were no vitals taken for this visit.   Physical Exam Vitals and nursing note reviewed.  Constitutional:      Appearance: She is well-developed.  HENT:     Head: Normocephalic and atraumatic.  Eyes:     Conjunctiva/sclera: Conjunctivae normal.  Cardiovascular:     Rate and Rhythm: Normal rate and regular rhythm.     Heart sounds: Normal heart sounds.  Pulmonary:     Effort: Pulmonary effort is normal.     Breath sounds: Normal breath sounds.  Abdominal:     General: Bowel sounds are normal.     Palpations: Abdomen is soft.  Musculoskeletal:     Cervical back: Normal range of motion.  Lymphadenopathy:     Cervical: No cervical adenopathy.  Skin:    General: Skin is warm and dry.     Capillary Refill: Capillary refill takes less than 2 seconds.  Neurological:     Mental Status: She is alert and oriented to person, place, and time.  Psychiatric:        Behavior: Behavior normal.      Musculoskeletal Exam: ***  CDAI Exam: CDAI Score: -- Patient Global: --; Provider Global: -- Swollen: --; Tender: -- Joint Exam 03/05/2024   No joint exam has been documented for this visit   There is currently no information documented on the homunculus. Go to the Rheumatology activity and complete the homunculus joint exam.  Investigation: No additional findings.  Imaging: No results found.  Recent Labs: Lab Results  Component Value Date   WBC 5.6 10/20/2023   HGB 9.5 (L) 10/20/2023   PLT 194 10/20/2023   NA 139 10/04/2023   K 4.0 10/04/2023   CL 103 10/04/2023   CO2 28 10/04/2023   GLUCOSE 44 (L) 10/04/2023   BUN 10 10/04/2023   CREATININE 0.72 10/04/2023   BILITOT 0.6 10/04/2023   ALKPHOS 63 08/20/2021   AST 12 10/04/2023   ALT 7 10/04/2023   PROT 7.2 10/04/2023   ALBUMIN 3.3 (L) 08/20/2021   CALCIUM 9.1 10/04/2023   GFRAA 139 04/17/2020    Speciality Comments: PLQ eye exam: 08/31/2021 WNL @  [facility]. Follow up in 1 year.  Procedures:  No procedures performed Allergies: Fruit extracts, Latex, and Zinc   Assessment / Plan:     Visit Diagnoses: Seronegative rheumatoid arthritis (HCC)  High risk medication use  Carpal tunnel syndrome, right upper limb  Pain in both hands  Chronic pain of right knee  Patellofemoral disorder of left knee  Spondylolysis, lumbar region  Chronic SI joint pain  Positive ANA (antinuclear antibody)  Family history of systemic lupus erythematosus  Myofascial pain  Other fatigue  Chronic pain syndrome  Trapezius muscle spasm  History of iron  deficiency anemia  Vitamin D deficiency  Vitamin B12 deficiency  History of depression  Type 1 diabetes mellitus with diabetic polyneuropathy (HCC)  History of gastroesophageal reflux (GERD)  Diabetic gastroparesis (HCC)  History of asthma  Orders: No orders of the defined types were placed in this  encounter.  No orders of the defined types were placed in this encounter.   Face-to-face time spent with patient was *** minutes. Greater than 50% of time was spent in counseling and coordination of care.  Follow-Up Instructions: No follow-ups on file.   Waddell CHRISTELLA Craze, PA-C  Note - This record has been created using Dragon software.  Chart creation errors have been sought, but may not always  have been located. Such creation errors do not reflect on  the standard of medical care.  "

## 2024-03-05 ENCOUNTER — Ambulatory Visit: Admitting: Physician Assistant

## 2024-03-05 DIAGNOSIS — G5601 Carpal tunnel syndrome, right upper limb: Secondary | ICD-10-CM

## 2024-03-05 DIAGNOSIS — Z8709 Personal history of other diseases of the respiratory system: Secondary | ICD-10-CM

## 2024-03-05 DIAGNOSIS — Z8659 Personal history of other mental and behavioral disorders: Secondary | ICD-10-CM

## 2024-03-05 DIAGNOSIS — Z8719 Personal history of other diseases of the digestive system: Secondary | ICD-10-CM

## 2024-03-05 DIAGNOSIS — M222X2 Patellofemoral disorders, left knee: Secondary | ICD-10-CM

## 2024-03-05 DIAGNOSIS — G8929 Other chronic pain: Secondary | ICD-10-CM

## 2024-03-05 DIAGNOSIS — R5383 Other fatigue: Secondary | ICD-10-CM

## 2024-03-05 DIAGNOSIS — M7918 Myalgia, other site: Secondary | ICD-10-CM

## 2024-03-05 DIAGNOSIS — Z79899 Other long term (current) drug therapy: Secondary | ICD-10-CM

## 2024-03-05 DIAGNOSIS — M62838 Other muscle spasm: Secondary | ICD-10-CM

## 2024-03-05 DIAGNOSIS — R7689 Other specified abnormal immunological findings in serum: Secondary | ICD-10-CM

## 2024-03-05 DIAGNOSIS — M4306 Spondylolysis, lumbar region: Secondary | ICD-10-CM

## 2024-03-05 DIAGNOSIS — M0600 Rheumatoid arthritis without rheumatoid factor, unspecified site: Secondary | ICD-10-CM

## 2024-03-05 DIAGNOSIS — E1143 Type 2 diabetes mellitus with diabetic autonomic (poly)neuropathy: Secondary | ICD-10-CM

## 2024-03-05 DIAGNOSIS — E538 Deficiency of other specified B group vitamins: Secondary | ICD-10-CM

## 2024-03-05 DIAGNOSIS — Z8269 Family history of other diseases of the musculoskeletal system and connective tissue: Secondary | ICD-10-CM

## 2024-03-05 DIAGNOSIS — M79641 Pain in right hand: Secondary | ICD-10-CM

## 2024-03-05 DIAGNOSIS — E559 Vitamin D deficiency, unspecified: Secondary | ICD-10-CM

## 2024-03-05 DIAGNOSIS — Z862 Personal history of diseases of the blood and blood-forming organs and certain disorders involving the immune mechanism: Secondary | ICD-10-CM

## 2024-03-05 DIAGNOSIS — E1042 Type 1 diabetes mellitus with diabetic polyneuropathy: Secondary | ICD-10-CM

## 2024-03-05 DIAGNOSIS — G894 Chronic pain syndrome: Secondary | ICD-10-CM

## 2024-04-11 NOTE — Progress Notes (Unsigned)
 "  Office Visit Note  Patient: [name]             Date of Birth: 20-Jul-1990           MRN: [id number]             PCP: [name], MD Referring: [name], MD Visit Date: 04/25/2024 Occupation: Data Unavailable  Subjective:  No chief complaint on file.   History of Present Illness: [name] is a 34 y.o. female ***  ANA positive 11/15/19.06/17/2020 ANA neg. 01/31/22: ANA negative.  Lab work from 01/31/22: ANA negative, dsDNA negative, complements WNL. Lab work from 03/21/2023 was reviewed today in the office: ANA remains positive, CRP within normal limits, complements within normal limits, double-stranded dsDNA negative, urine protein creatinine ratio elevated, ESR 36.   Activities of Daily Living:  Patient reports morning stiffness for *** {minute/hour:19697}.   Patient {ACTIONS;DENIES/REPORTS:21021675::Denies} nocturnal pain.  Difficulty dressing/grooming: {ACTIONS;DENIES/REPORTS:21021675::Denies} Difficulty climbing stairs: {ACTIONS;DENIES/REPORTS:21021675::Denies} Difficulty getting out of chair: {ACTIONS;DENIES/REPORTS:21021675::Denies} Difficulty using hands for taps, buttons, cutlery, and/or writing: {ACTIONS;DENIES/REPORTS:21021675::Denies}  No Rheumatology ROS completed.   PMFS History:  Patient Active Problem List   Diagnosis Date Noted   IDA (iron  deficiency anemia) 11/06/2020   Intractable abdominal pain 07/26/2020   Right upper quadrant abdominal pain 07/26/2020   Acute pyelonephritis 07/26/2020   Diabetes mellitus type 1, controlled, with complications (HCC) 07/26/2020   Obesity, Class III, BMI 40-49.9 (morbid obesity) (HCC) 07/26/2020   Enterococcus faecalis infection 07/26/2020   Essential hypertension 07/26/2020   RUQ abdominal pain 07/26/2020   Chronic low back pain 11/22/2013   Clinical depression 10/07/2013   Extreme obesity 10/07/2013   Diabetic neuropathy (HCC) 08/06/2013   General patient noncompliance 12/21/2012    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HCC) 12/30/2010   Appetite disorder 12/30/2010   BP (high blood pressure) 12/30/2010   Muscle ache 12/30/2010   Cachectic 12/30/2010   Patellofemoral disorder of left knee 09/03/2010   Diabetes mellitus (HCC) 09/03/2010    Past Medical History:  Diagnosis Date   Anemia    Anxiety    Arthritis    Asthma    asthma as a child, no problems as an adult   Diabetes mellitus without complication (HCC)    type1 - insulin  pump   Dysrhythmia    GERD (gastroesophageal reflux disease)    Headache    HLD (hyperlipidemia)    diet controlled   Hypertension    no meds   Neuromuscular disorder (HCC) 2015   periph. neuropathy- no current problems   Vitamin D deficiency     Family History  Problem Relation Age of Onset   Kidney disease Mother    Sickle cell trait Father    Asthma Sister    ADD / ADHD Sister    Sickle cell anemia Sister    Lupus Maternal Grandmother    Past Surgical History:  Procedure Laterality Date   CATARACT EXTRACTION, BILATERAL Bilateral 2022   CHOLECYSTECTOMY N/A 11/11/2021   Procedure: LAPAROSCOPIC CHOLECYSTECTOMY;  Surgeon: Rubin Calamity, MD;  Location: [facility];  Service: General;  Laterality: N/A;   GASTRIC BYPASS  07/2017   WISDOM TOOTH EXTRACTION     Social History[1] Social History   Social History Narrative   Not on file     Immunization History  Administered Date(s) Administered   Influenza Inj Mdck Quad Pf 02/11/2018   PFIZER(Purple Top)SARS-COV-2 Vaccination 05/28/2019, 06/18/2019, 03/06/2020, 09/13/2020   Pneumococcal Polysaccharide-23 10/07/2013     Objective: Vital Signs: There were no vitals  taken for this visit.   Physical Exam   Musculoskeletal Exam: ***  CDAI Exam: CDAI Score: -- Patient Global: --; Provider Global: -- Swollen: --; Tender: -- Joint Exam 04/25/2024   No joint exam has been documented for this visit   There is currently no information documented on the homunculus. Go to the Rheumatology  activity and complete the homunculus joint exam.  Investigation: No additional findings.  Imaging: No results found.  Recent Labs: Lab Results  Component Value Date   WBC 5.6 10/20/2023   HGB 9.5 (L) 10/20/2023   PLT 194 10/20/2023   NA 139 10/04/2023   K 4.0 10/04/2023   CL 103 10/04/2023   CO2 28 10/04/2023   GLUCOSE 44 (L) 10/04/2023   BUN 10 10/04/2023   CREATININE 0.72 10/04/2023   BILITOT 0.6 10/04/2023   ALKPHOS 63 08/20/2021   AST 12 10/04/2023   ALT 7 10/04/2023   PROT 7.2 10/04/2023   ALBUMIN 3.3 (L) 08/20/2021   CALCIUM 9.1 10/04/2023   GFRAA 139 04/17/2020    Speciality Comments: PLQ eye exam: 01/19/2024 WNL @ [facility]. Follow up in 1 year.  Procedures:  No procedures performed Allergies: Fruit extracts, Latex, and Zinc   Assessment / Plan:     Visit Diagnoses: Seronegative rheumatoid arthritis (HCC)  High risk medication use  Carpal tunnel syndrome, right upper limb  Pain in both hands  Chronic pain of right knee  Patellofemoral disorder of left knee  Spondylolysis, lumbar region  Chronic SI joint pain  Positive ANA (antinuclear antibody)  Family history of systemic lupus erythematosus  Myofascial pain  Other fatigue  Chronic pain syndrome  Trapezius muscle spasm  History of iron  deficiency anemia  Vitamin D deficiency  Vitamin B12 deficiency  History of depression  Type 1 diabetes mellitus with diabetic polyneuropathy (HCC)  History of gastroesophageal reflux (GERD)  Diabetic gastroparesis (HCC)  History of asthma  Orders: No orders of the defined types were placed in this encounter.  No orders of the defined types were placed in this encounter.   Face-to-face time spent with patient was *** minutes. Greater than 50% of time was spent in counseling and coordination of care.  Follow-Up Instructions: No follow-ups on file.   Waddell CHRISTELLA Craze, PA-C  Note - This record has been created using Dragon  software.  Chart creation errors have been sought, but may not always  have been located. Such creation errors do not reflect on  the standard of medical care.     [1]  Social History Tobacco Use   Smoking status: Never    Passive exposure: Never   Smokeless tobacco: Never  Vaping Use   Vaping status: Former  Substance Use Topics   Alcohol use: Yes    Comment: occasional wine/liquor   Drug use: Never   "

## 2024-04-21 ENCOUNTER — Encounter: Payer: Self-pay | Admitting: Hematology and Oncology

## 2024-04-25 ENCOUNTER — Ambulatory Visit: Admitting: Physician Assistant

## 2024-04-25 DIAGNOSIS — G5601 Carpal tunnel syndrome, right upper limb: Secondary | ICD-10-CM

## 2024-04-25 DIAGNOSIS — E1042 Type 1 diabetes mellitus with diabetic polyneuropathy: Secondary | ICD-10-CM

## 2024-04-25 DIAGNOSIS — G894 Chronic pain syndrome: Secondary | ICD-10-CM

## 2024-04-25 DIAGNOSIS — G8929 Other chronic pain: Secondary | ICD-10-CM

## 2024-04-25 DIAGNOSIS — M0600 Rheumatoid arthritis without rheumatoid factor, unspecified site: Secondary | ICD-10-CM

## 2024-04-25 DIAGNOSIS — Z79899 Other long term (current) drug therapy: Secondary | ICD-10-CM

## 2024-04-25 DIAGNOSIS — M7918 Myalgia, other site: Secondary | ICD-10-CM

## 2024-04-25 DIAGNOSIS — Z8709 Personal history of other diseases of the respiratory system: Secondary | ICD-10-CM

## 2024-04-25 DIAGNOSIS — E559 Vitamin D deficiency, unspecified: Secondary | ICD-10-CM

## 2024-04-25 DIAGNOSIS — M79641 Pain in right hand: Secondary | ICD-10-CM

## 2024-04-25 DIAGNOSIS — M222X2 Patellofemoral disorders, left knee: Secondary | ICD-10-CM

## 2024-04-25 DIAGNOSIS — R7689 Other specified abnormal immunological findings in serum: Secondary | ICD-10-CM

## 2024-04-25 DIAGNOSIS — E538 Deficiency of other specified B group vitamins: Secondary | ICD-10-CM

## 2024-04-25 DIAGNOSIS — Z8269 Family history of other diseases of the musculoskeletal system and connective tissue: Secondary | ICD-10-CM

## 2024-04-25 DIAGNOSIS — Z862 Personal history of diseases of the blood and blood-forming organs and certain disorders involving the immune mechanism: Secondary | ICD-10-CM

## 2024-04-25 DIAGNOSIS — E1143 Type 2 diabetes mellitus with diabetic autonomic (poly)neuropathy: Secondary | ICD-10-CM

## 2024-04-25 DIAGNOSIS — Z8659 Personal history of other mental and behavioral disorders: Secondary | ICD-10-CM

## 2024-04-25 DIAGNOSIS — R5383 Other fatigue: Secondary | ICD-10-CM

## 2024-04-25 DIAGNOSIS — M4306 Spondylolysis, lumbar region: Secondary | ICD-10-CM

## 2024-04-25 DIAGNOSIS — Z8719 Personal history of other diseases of the digestive system: Secondary | ICD-10-CM

## 2024-04-25 DIAGNOSIS — M62838 Other muscle spasm: Secondary | ICD-10-CM

## 2024-04-26 ENCOUNTER — Other Ambulatory Visit (HOSPITAL_COMMUNITY): Admission: RE | Admit: 2024-04-26 | Payer: Self-pay | Source: Ambulatory Visit

## 2024-04-26 ENCOUNTER — Inpatient Hospital Stay

## 2024-04-26 ENCOUNTER — Inpatient Hospital Stay: Admitting: Hematology and Oncology

## 2024-04-26 VITALS — BP 136/99 | HR 111 | Temp 98.3°F | Resp 18 | Ht 64.75 in | Wt 262.2 lb

## 2024-04-26 DIAGNOSIS — D500 Iron deficiency anemia secondary to blood loss (chronic): Secondary | ICD-10-CM

## 2024-04-26 DIAGNOSIS — K5904 Chronic idiopathic constipation: Secondary | ICD-10-CM

## 2024-04-26 LAB — VITAMIN B12: Vitamin B-12: 750 pg/mL (ref 180–914)

## 2024-04-26 LAB — CBC WITH DIFFERENTIAL/PLATELET
Abs Immature Granulocytes: 0.01 10*3/uL (ref 0.00–0.07)
Basophils Absolute: 0.1 10*3/uL (ref 0.0–0.1)
Basophils Relative: 1 %
Eosinophils Absolute: 0.1 10*3/uL (ref 0.0–0.5)
Eosinophils Relative: 2 %
HCT: 34.6 % — ABNORMAL LOW (ref 36.0–46.0)
Hemoglobin: 11.1 g/dL — ABNORMAL LOW (ref 12.0–15.0)
Immature Granulocytes: 0 %
Lymphocytes Relative: 36 %
Lymphs Abs: 2 10*3/uL (ref 0.7–4.0)
MCH: 25.6 pg — ABNORMAL LOW (ref 26.0–34.0)
MCHC: 32.1 g/dL (ref 30.0–36.0)
MCV: 79.9 fL — ABNORMAL LOW (ref 80.0–100.0)
Monocytes Absolute: 0.5 10*3/uL (ref 0.1–1.0)
Monocytes Relative: 9 %
Neutro Abs: 2.9 10*3/uL (ref 1.7–7.7)
Neutrophils Relative %: 52 %
Platelets: 276 10*3/uL (ref 150–400)
RBC: 4.33 MIL/uL (ref 3.87–5.11)
RDW: 14.3 % (ref 11.5–15.5)
WBC: 5.4 10*3/uL (ref 4.0–10.5)
nRBC: 0 % (ref 0.0–0.2)

## 2024-04-26 LAB — TSH: TSH: 0.604 u[IU]/mL (ref 0.350–4.500)

## 2024-04-26 LAB — IRON AND IRON BINDING CAPACITY (CC-WL,HP ONLY)
Iron: 117 ug/dL (ref 28–170)
Saturation Ratios: 26 % (ref 10.4–31.8)
TIBC: 458 ug/dL — ABNORMAL HIGH (ref 250–450)
UIBC: 341 ug/dL

## 2024-04-26 LAB — FERRITIN: Ferritin: 23 ng/mL (ref 11–307)

## 2024-04-26 MED ORDER — FERROUS SULFATE 325 (65 FE) MG PO TBEC: 325.0000 mg | DELAYED_RELEASE_TABLET | Freq: Two times a day (BID) | ORAL | 3 refills | Status: AC

## 2024-04-26 NOTE — Progress Notes (Signed)
 [name] CONSULT NOTE  Patient Care Team: Stacy Purchase, MD as PCP - General (Endocrinology)  CHIEF COMPLAINTS/PURPOSE OF CONSULTATION:  Anemia, unspecified, follow-up  ASSESSMENT & PLAN:   This is a very pleasant 34 year old female patient with juvenile diabetes, rheumatoid arthritis on Plaquenil , hypertension, obesity status post gastric sleeve surgery status post IV Venofer  last dose in September 2022 here for follow-up.   Assessment & Plan  Iron  deficiency anemia due to chronic blood loss Chronic iron  deficiency anemia likely due to menstrual blood loss, with symptoms of fatigue, exertional dyspnea, and pica. Nonadherence to oral iron  therapy noted.  Current severity unclear without recent labs. - Sent prescription for oral ferrous iron .  Systemic lupus erythematosus Systemic lupus erythematosus managed with Plaquenil . Fewer flare-ups and no acute symptoms reported. - Confirmed continued use of Plaquenil .  Constipation Chronic, severe constipation unresponsive to magnesium, Miralax , and fiber. No bowel movement despite interventions. - Instructed to continue Miralax  twice daily PRN until bowel movement. - Advised increased water intake.    Thank you for consulting us  the care of this patient.  Please not hesitate to contact us  with any additional questions or concerns  HISTORY OF PRESENTING ILLNESS:   [name] 34 y.o. female is here because of anemia.  This is a very pleasant 34 year old female patient with past medical history significant for juvenile diabetes, rheumatoid arthritis on Plaquenil , gastric sleeve surgery referred to hematology for evaluation of anemia.  During her last visit, we have agreed to proceeding with intravenous iron  infusion.  She completed her Venofer  on 12/02/2020 and is here for follow-up  Discussed the use of AI scribe software for clinical note transcription with the patient, who gave verbal consent to proceed.  History  of Present Illness  [name] is a 34 year old female with iron  deficiency anemia secondary to chronic blood loss who presents for hematology follow-up for persistent fatigue.  She reports persistent fatigue that has worsened over the past two weeks, now significantly limiting exertional activities such as climbing stairs. Associated symptoms include increased tiredness, exertional dyspnea, and pica with cravings for ice and starchy foods. She is currently taking daily oral ferrous iron , with a recent period of nonadherence, and receives monthly B12 injections following an initial loading regimen. She requests a refill of her iron  prescription. She denies lightheadedness, falls, significant weight changes, or increased psychosocial stress. She attributes some dyspnea to recent upper respiratory symptoms with nasal congestion.  Menstrual cycles are regular, with the last menses two weeks ago lasting five days. She continues to experience mastalgia, persistent lower abdominal cramping, ongoing cravings, vasomotor symptoms described as hot flashes, and frequent night sweats resulting in nocturnal diaphoresis. She perceives possible hormonal imbalance and has a forthcoming gynecology evaluation. Thyroid function has not been recently assessed, and she has no history of thyroid disease.  Constipation is severe and refractory to over-the-counter agents including magnesium, polyethylene glycol, and fiber supplements, with no bowel movement despite these interventions. She is awaiting insurance approval for a prescription medication and has been advised to continue polyethylene glycol twice daily and increase fluid intake.  Sickle cell trait has been previously discussed. She has been followed by hematology for nearly four years.  Rest of the pertinent 10 point ROS reviewed and neg.  MEDICAL HISTORY:  Past Medical History:  Diagnosis Date   Anemia    Anxiety    Arthritis    Asthma    asthma as a  child, no problems as an adult  Diabetes mellitus without complication (HCC)    type1 - insulin  pump   Dysrhythmia    GERD (gastroesophageal reflux disease)    Headache    HLD (hyperlipidemia)    diet controlled   Hypertension    no meds   Neuromuscular disorder (HCC) 2015   periph. neuropathy- no current problems   Vitamin D deficiency     SURGICAL HISTORY: Past Surgical History:  Procedure Laterality Date   CATARACT EXTRACTION, BILATERAL Bilateral 2022   CHOLECYSTECTOMY N/A 11/11/2021   Procedure: LAPAROSCOPIC CHOLECYSTECTOMY;  Surgeon: Rubin Calamity, MD;  Location: [facility];  Service: General;  Laterality: N/A;   GASTRIC BYPASS  07/2017   WISDOM TOOTH EXTRACTION      SOCIAL HISTORY: Social History   Socioeconomic History   Marital status: Single    Spouse name: Not on file   Number of children: Not on file   Years of education: Not on file   Highest education level: Not on file  Occupational History   Not on file  Tobacco Use   Smoking status: Never    Passive exposure: Never   Smokeless tobacco: Never  Vaping Use   Vaping status: Former  Substance and Sexual Activity   Alcohol use: Yes    Comment: occasional wine/liquor   Drug use: Never   Sexual activity: Yes    Birth control/protection: None  Other Topics Concern   Not on file  Social History Narrative   Not on file   Social Drivers of Health   Tobacco Use: Low Risk (10/20/2023)   Patient History    Smoking Tobacco Use: Never    Smokeless Tobacco Use: Never    Passive Exposure: Never  Financial Resource Strain: Not on file  Food Insecurity: Not on file  Transportation Needs: Not on file  Physical Activity: Not on file  Stress: Not on file  Social Connections: Unknown (08/27/2021)   Received [facility]   Social Network    Social Network: Not on file  Intimate Partner Violence: Unknown (08/27/2021)   Received from [facility]   HITS    Physically Hurt: Not on file    Insult or Talk Down  To: Not on file    Threaten Physical Harm: Not on file    Scream or Curse: Not on file  Depression (PHQ2-9): Not on file  Alcohol Screen: Not on file  Housing: Not on file  Utilities: Not on file  Health Literacy: Not on file    FAMILY HISTORY: Family History  Problem Relation Age of Onset   Kidney disease Mother    Sickle cell trait Father    Asthma Sister    ADD / ADHD Sister    Sickle cell anemia Sister    Lupus Maternal Grandmother     ALLERGIES:  is allergic to fruit extracts, latex, and zinc.  MEDICATIONS:  Current Outpatient Medications  Medication Sig Dispense Refill   albuterol  (PROVENTIL  HFA;VENTOLIN  HFA) 108 (90 Base) MCG/ACT inhaler Inhale 2 puffs into the lungs every 4 (four) hours as needed for wheezing.     ALPRAZolam (XANAX) 0.5 MG tablet Take by mouth.     baclofen (LIORESAL) 10 MG tablet Take 10 mg by mouth 3 (three) times daily as needed for muscle spasms.     cyanocobalamin  (VITAMIN B12) 1000 MCG/ML injection Inject 1 mL (1,000 mcg total) into the muscle as directed. Daily for 7 days, then weekly for 4 weeks followed by monthly 20 mL 1   diclofenac  Sodium (VOLTAREN )  1 % GEL Apply 2-4 grams to affected joint 4 times daily as needed. 400 g 2   ergocalciferol (VITAMIN D2) 1.25 MG (50000 UT) capsule Take 50,000 Units by mouth every Thursday.     ferrous sulfate  325 (65 FE) MG EC tablet Take 1 tablet (325 mg total) by mouth 2 (two) times daily with a meal. 60 tablet 3   FLUoxetine  (PROZAC ) 20 MG capsule Take 20 mg by mouth daily.     FLUoxetine  (PROZAC ) 40 MG capsule Take 40 mg by mouth daily.     glucose blood test strip for use when checking blood sugars InVitro three times a day for 90 days     HUMALOG 100 UNIT/ML injection Inject 100 Units as directed See admin instructions. Inject up to 100 units per day via insulin  pump as directed  9   HYDROcodone -acetaminophen  (NORCO/VICODIN) 5-325 MG tablet Take 1 tablet by mouth every 6 (six) hours as needed for moderate  pain or severe pain.     hydroxychloroquine  (PLAQUENIL ) 200 MG tablet TAKE 1 TABLET(200 MG) BY MOUTH TWICE DAILY 180 tablet 0   Insulin  Pen Needle 32G X 4 MM MISC . subcutaneous 4 times a day for 30 days     Lactobacillus (PROBIOTIC ACIDOPHILUS) TABS Take 1 tablet by mouth daily. (Patient not taking: Reported on 10/04/2023)     losartan (COZAAR) 50 MG tablet Take 100 mg by mouth daily. (Patient not taking: Reported on 10/20/2023)     magic mouthwash (nystatin , hydrocortisone, diphenhydrAMINE ) suspension Swish and spit 5 mLs 3 (three) times daily. (Patient taking differently: Swish and spit 5 mLs as needed.) 240 mL 0   meloxicam  (MOBIC ) 7.5 MG tablet Take 1 tablet (7.5 mg total) by mouth daily as needed for pain. 90 tablet 0   methocarbamol  (ROBAXIN ) 500 MG tablet Take 1 tablet (500 mg total) by mouth 2 (two) times daily. (Patient not taking: Reported on 10/20/2023) 20 tablet 0   ondansetron  (ZOFRAN  ODT) 4 MG disintegrating tablet Take 1 tablet (4 mg total) by mouth every 8 (eight) hours as needed for up to 15 doses for nausea or vomiting. 15 tablet 0   pantoprazole (PROTONIX) 40 MG tablet Take 40 mg by mouth daily. (Patient not taking: Reported on 10/20/2023)     prazosin  (MINIPRESS ) 2 MG capsule Take 2 mg by mouth at bedtime as needed (sleep).     propranolol (INDERAL) 20 MG tablet Take 20 mg by mouth daily. (Patient not taking: Reported on 10/04/2023)     Syringe/Needle, Disp, (SYRINGE 3CC/25GX1) 25G X 1 3 ML MISC Use for self administration of B12 50 each 1   traZODone  (DESYREL ) 50 MG tablet Take 100 mg by mouth at bedtime as needed for sleep.     triamcinolone  cream (KENALOG ) 0.1 % Apply 1 Application topically 2 (two) times daily as needed. 45 g 0   No current facility-administered medications for this visit.     PHYSICAL EXAMINATION:  ECOG PERFORMANCE STATUS: 0 - Asymptomatic  Vitals:   04/26/24 1304  BP: (!) 136/99  Pulse: (!) 111  Resp: 18  Temp: 98.3 F (36.8 C)  SpO2: 100%       Filed Weights   04/26/24 1304  Weight: 262 lb 3.2 oz (118.9 kg)    Physical Exam Constitutional:      Appearance: Normal appearance.  HENT:     Head: Normocephalic and atraumatic.  Cardiovascular:     Rate and Rhythm: Normal rate and regular rhythm.     Pulses:  Normal pulses.     Heart sounds: Normal heart sounds.  Pulmonary:     Effort: Pulmonary effort is normal.     Breath sounds: Normal breath sounds.  Musculoskeletal:        General: No swelling or tenderness.     Cervical back: Normal range of motion and neck supple. No rigidity.  Lymphadenopathy:     Cervical: No cervical adenopathy.  Skin:    General: Skin is warm.  Neurological:     General: No focal deficit present.     Mental Status: She is alert.  Psychiatric:        Mood and Affect: Mood normal.      LABORATORY DATA:  I have reviewed the data as listed Lab Results  Component Value Date   WBC 5.6 10/20/2023   HGB 9.5 (L) 10/20/2023   HCT 31.3 (L) 10/20/2023   HCT 29.3 (L) 10/20/2023   MCV 82.3 10/20/2023   PLT 194 10/20/2023     Chemistry      Component Value Date/Time   NA 139 10/04/2023 1329   K 4.0 10/04/2023 1329   CL 103 10/04/2023 1329   CO2 28 10/04/2023 1329   BUN 10 10/04/2023 1329   CREATININE 0.72 10/04/2023 1329      Component Value Date/Time   CALCIUM 9.1 10/04/2023 1329   ALKPHOS 63 08/20/2021 1315   AST 12 10/04/2023 1329   AST 14 (L) 08/20/2021 1315   ALT 7 10/04/2023 1329   ALT 11 08/20/2021 1315   BILITOT 0.6 10/04/2023 1329   BILITOT 0.6 08/20/2021 1315       RADIOGRAPHIC STUDIES: I have personally reviewed the radiological images as listed and agreed with the findings in the report. No results found.  All questions were answered. The patient knows to call the clinic with any problems, questions or concerns.  Total time spent: 30 min including history and physical, review of records, counseling and coordination of care.    Amber Stalls, MD 04/26/2024  1:23 PM

## 2024-05-27 ENCOUNTER — Ambulatory Visit: Admitting: Physician Assistant

## 2024-10-25 ENCOUNTER — Inpatient Hospital Stay: Admitting: Hematology and Oncology

## 2024-10-25 ENCOUNTER — Inpatient Hospital Stay
